# Patient Record
Sex: Female | Born: 1935 | Race: Black or African American | Hispanic: No | Smoking: Former smoker
Health system: Southern US, Community
[De-identification: ages and names within clinical notes are randomized; demographics above are authoritative.]

## PROBLEM LIST (undated history)

## (undated) DIAGNOSIS — F409 Phobic anxiety disorder, unspecified: Secondary | ICD-10-CM

## (undated) DIAGNOSIS — H539 Unspecified visual disturbance: Secondary | ICD-10-CM

## (undated) DIAGNOSIS — H409 Unspecified glaucoma: Secondary | ICD-10-CM

## (undated) DIAGNOSIS — R413 Other amnesia: Secondary | ICD-10-CM

## (undated) DIAGNOSIS — F0390 Unspecified dementia without behavioral disturbance: Secondary | ICD-10-CM

## (undated) DIAGNOSIS — I519 Heart disease, unspecified: Secondary | ICD-10-CM

## (undated) DIAGNOSIS — G3184 Mild cognitive impairment, so stated: Secondary | ICD-10-CM

## (undated) DIAGNOSIS — M1990 Unspecified osteoarthritis, unspecified site: Secondary | ICD-10-CM

## (undated) DIAGNOSIS — F29 Unspecified psychosis not due to a substance or known physiological condition: Secondary | ICD-10-CM

## (undated) DIAGNOSIS — I2510 Atherosclerotic heart disease of native coronary artery without angina pectoris: Secondary | ICD-10-CM

## (undated) DIAGNOSIS — I639 Cerebral infarction, unspecified: Secondary | ICD-10-CM

## (undated) DIAGNOSIS — R443 Hallucinations, unspecified: Secondary | ICD-10-CM

## (undated) DIAGNOSIS — I10 Essential (primary) hypertension: Secondary | ICD-10-CM

## (undated) HISTORY — DX: Cerebral infarction, unspecified: I63.9

## (undated) HISTORY — DX: Unspecified psychosis not due to a substance or known physiological condition: F29

## (undated) HISTORY — PX: CORONARY ANGIOPLASTY WITH STENT PLACEMENT: SHX49

## (undated) HISTORY — DX: Unspecified osteoarthritis, unspecified site: M19.90

## (undated) HISTORY — DX: Phobic anxiety disorder, unspecified: F40.9

## (undated) HISTORY — DX: Unspecified visual disturbance: H53.9

## (undated) HISTORY — DX: Mild cognitive impairment of uncertain or unknown etiology: G31.84

## (undated) HISTORY — DX: Hallucinations, unspecified: R44.3

## (undated) HISTORY — DX: Unspecified glaucoma: H40.9

## (undated) HISTORY — DX: Unspecified dementia, unspecified severity, without behavioral disturbance, psychotic disturbance, mood disturbance, and anxiety: F03.90

## (undated) HISTORY — PX: CORONARY STENT PLACEMENT: SHX1402

## (undated) HISTORY — DX: Heart disease, unspecified: I51.9

---

## 2007-05-24 DIAGNOSIS — Z955 Presence of coronary angioplasty implant and graft: Secondary | ICD-10-CM

## 2007-05-24 HISTORY — DX: Presence of coronary angioplasty implant and graft: Z95.5

## 2007-08-17 ENCOUNTER — Emergency Department (HOSPITAL_COMMUNITY): Admission: EM | Admit: 2007-08-17 | Discharge: 2007-08-17 | Payer: Self-pay | Admitting: Emergency Medicine

## 2007-12-23 ENCOUNTER — Emergency Department (HOSPITAL_COMMUNITY): Admission: EM | Admit: 2007-12-23 | Discharge: 2007-12-23 | Payer: Self-pay | Admitting: Emergency Medicine

## 2008-07-16 DIAGNOSIS — M1990 Unspecified osteoarthritis, unspecified site: Secondary | ICD-10-CM | POA: Insufficient documentation

## 2008-07-16 DIAGNOSIS — R0602 Shortness of breath: Secondary | ICD-10-CM

## 2008-07-16 DIAGNOSIS — R634 Abnormal weight loss: Secondary | ICD-10-CM

## 2008-07-16 DIAGNOSIS — R443 Hallucinations, unspecified: Secondary | ICD-10-CM

## 2008-07-16 DIAGNOSIS — Z8673 Personal history of transient ischemic attack (TIA), and cerebral infarction without residual deficits: Secondary | ICD-10-CM

## 2008-07-16 DIAGNOSIS — E109 Type 1 diabetes mellitus without complications: Secondary | ICD-10-CM | POA: Insufficient documentation

## 2008-07-16 DIAGNOSIS — H409 Unspecified glaucoma: Secondary | ICD-10-CM | POA: Insufficient documentation

## 2008-07-16 DIAGNOSIS — E785 Hyperlipidemia, unspecified: Secondary | ICD-10-CM

## 2008-07-16 DIAGNOSIS — F411 Generalized anxiety disorder: Secondary | ICD-10-CM

## 2008-07-16 DIAGNOSIS — I10 Essential (primary) hypertension: Secondary | ICD-10-CM | POA: Insufficient documentation

## 2008-07-16 DIAGNOSIS — I259 Chronic ischemic heart disease, unspecified: Secondary | ICD-10-CM | POA: Insufficient documentation

## 2008-07-16 DIAGNOSIS — G4700 Insomnia, unspecified: Secondary | ICD-10-CM

## 2008-07-16 DIAGNOSIS — F409 Phobic anxiety disorder, unspecified: Secondary | ICD-10-CM | POA: Insufficient documentation

## 2008-07-16 DIAGNOSIS — J301 Allergic rhinitis due to pollen: Secondary | ICD-10-CM | POA: Insufficient documentation

## 2011-02-14 LAB — POCT I-STAT, CHEM 8
BUN: 14
Calcium, Ion: 1.25
Chloride: 102
Creatinine, Ser: 0.7
Glucose, Bld: 157 — ABNORMAL HIGH
HCT: 33 — ABNORMAL LOW
Hemoglobin: 11.2 — ABNORMAL LOW
Potassium: 4.1
Sodium: 137
TCO2: 29

## 2011-02-14 LAB — DIFFERENTIAL
Basophils Absolute: 0
Basophils Relative: 1
Eosinophils Absolute: 0.1
Monocytes Absolute: 0.4
Neutro Abs: 3.2
Neutrophils Relative %: 55

## 2011-02-14 LAB — POCT CARDIAC MARKERS
CKMB, poc: <1 — ABNORMAL LOW
Myoglobin, poc: 28.5
Operator id: 285841
Troponin i, poc: <0.05

## 2011-02-14 LAB — URINE CULTURE: Colony Count: 50000

## 2011-02-14 LAB — URINALYSIS, ROUTINE W REFLEX MICROSCOPIC
Bilirubin Urine: NEGATIVE
Glucose, UA: NEGATIVE
Hgb urine dipstick: NEGATIVE
Ketones, ur: NEGATIVE
Specific Gravity, Urine: 1.013
pH: 7.5

## 2011-02-14 LAB — CBC
Hemoglobin: 11.4 — ABNORMAL LOW
MCHC: 34.3
MCV: 89.4
RDW: 12.8

## 2011-02-14 LAB — URINE MICROSCOPIC-ADD ON

## 2011-02-18 LAB — DIFFERENTIAL
Eosinophils Absolute: 0.1
Lymphocytes Relative: 41
Lymphs Abs: 2.2
Monocytes Relative: 7
Neutrophils Relative %: 51

## 2011-02-18 LAB — CBC
HCT: 31.9 — ABNORMAL LOW
MCV: 88.9
Platelets: 197
RBC: 3.59 — ABNORMAL LOW
WBC: 5.4

## 2011-02-18 LAB — URINALYSIS, ROUTINE W REFLEX MICROSCOPIC
Bilirubin Urine: NEGATIVE
Glucose, UA: NEGATIVE
Hgb urine dipstick: NEGATIVE
Protein, ur: NEGATIVE
Urobilinogen, UA: 0.2

## 2011-02-18 LAB — URINE CULTURE: Culture: NO GROWTH

## 2011-02-18 LAB — BASIC METABOLIC PANEL
BUN: 10
Chloride: 110
Creatinine, Ser: 0.64
GFR calc Af Amer: >60
GFR calc non Af Amer: >60
Potassium: 3.7

## 2011-08-11 ENCOUNTER — Encounter (HOSPITAL_COMMUNITY): Payer: Self-pay | Admitting: *Deleted

## 2011-08-11 ENCOUNTER — Emergency Department (HOSPITAL_COMMUNITY)
Admission: EM | Admit: 2011-08-11 | Discharge: 2011-08-11 | Disposition: A | Discharge status: Home / Self Care | Payer: Medicare Other | Attending: Emergency Medicine | Admitting: Emergency Medicine

## 2011-08-11 DIAGNOSIS — R259 Unspecified abnormal involuntary movements: Secondary | ICD-10-CM | POA: Insufficient documentation

## 2011-08-11 DIAGNOSIS — E1169 Type 2 diabetes mellitus with other specified complication: Secondary | ICD-10-CM | POA: Insufficient documentation

## 2011-08-11 DIAGNOSIS — R251 Tremor, unspecified: Secondary | ICD-10-CM

## 2011-08-11 DIAGNOSIS — I10 Essential (primary) hypertension: Secondary | ICD-10-CM | POA: Insufficient documentation

## 2011-08-11 DIAGNOSIS — E162 Hypoglycemia, unspecified: Secondary | ICD-10-CM

## 2011-08-11 DIAGNOSIS — F411 Generalized anxiety disorder: Secondary | ICD-10-CM | POA: Insufficient documentation

## 2011-08-11 DIAGNOSIS — R51 Headache: Secondary | ICD-10-CM | POA: Insufficient documentation

## 2011-08-11 HISTORY — DX: Essential (primary) hypertension: I10

## 2011-08-11 LAB — GLUCOSE, CAPILLARY
Glucose-Capillary: 48 mg/dL — ABNORMAL LOW (ref 70–99)
Glucose-Capillary: 353 mg/dL — ABNORMAL HIGH (ref 70–99)
Glucose-Capillary: 180 mg/dL — ABNORMAL HIGH (ref 70–99)

## 2011-08-11 LAB — POCT I-STAT, CHEM 8
BUN: 14 mg/dL (ref 6–23)
Chloride: 104 mEq/L (ref 96–112)
Creatinine, Ser: 0.7 mg/dL (ref 0.50–1.10)
Sodium: 137 mEq/L (ref 135–145)

## 2011-08-11 MED ORDER — DEXTROSE 50 % IV SOLN
1.0000 | Freq: Once | INTRAVENOUS | Status: AC
  Administered 2011-08-11: 50 mL via INTRAVENOUS
  Filled 2011-08-11: qty 50

## 2011-08-11 MED ORDER — ACETAMINOPHEN 325 MG PO TABS
650.0000 mg | ORAL_TABLET | Freq: Once | ORAL | Status: AC
  Administered 2011-08-11: 650 mg via ORAL
  Filled 2011-08-11: qty 2

## 2011-08-11 NOTE — Discharge Instructions (Signed)
It is important for you to follow up with your primary care physician regarding your blood sugar.  It is also important for you to eat something every couple of hours to prevent your blood sugar from dropping.

## 2011-08-11 NOTE — ED Notes (Signed)
Heather PA aware of pt glucose from i-stat results.

## 2011-08-11 NOTE — ED Notes (Addendum)
Per EMS- they were called to house doe shortness of breath, upon arrival pt c/o anxiety and shaking, pt has calmed down at this time, denies shortness of breath, continues to c/o scalp pain and states that it feels like someone is pulling her hair, no distress at this time, CBG 144

## 2011-08-11 NOTE — ED Provider Notes (Signed)
History     CSN: [id number]  Arrival date & time 08/11/11  1639   First MD Initiated Contact with Patient 08/11/11 1707      Chief Complaint  Patient presents with  . Anxiety    (Consider location/radiation/quality/duration/timing/severity/associated sxs/prior treatment) HPI Comments: Patient comes in today with a chief complaint of a headache and tremors.  Headache is frontal and does not radiate.  She reports that she has had a headache intermittently for the past month.  She reports that she has seen her PCP for this complaint.  She reports that she was recently started on Gabapentin for diabetic neuropathy.  She also reports that she has a history of DM and is currently on Novalog 70/30.  She takes this medication in the morning.  She is on 20 Units.  She reports that she checks her blood sugar once daily and her blood sugars typically run between 120 and 220.  She also reports that her blood sugar typically drops when she has not eaten.  The last time she had anything to eat was at noon today.  She reports that at this time she feels that her blood sugar is low.   She also reports that she was feeling upset and anxious earlier today, but does not feel that way at this time.  No SI or HI.  She is not on any regular medication for DM.    The history is provided by the patient.    Past Medical History  Diagnosis Date  . Hypertension   . Diabetes mellitus     No past surgical history on file.  No family history on file.  History  Substance Use Topics  . Smoking status: Not on file  . Smokeless tobacco: Not on file  . Alcohol Use:     OB History    Grav Para Term Preterm Abortions TAB SAB Ect Mult Living                  Review of Systems  Constitutional: Negative for fever and chills.  HENT: Negative for neck pain and neck stiffness.   Eyes: Negative for pain and visual disturbance.  Respiratory: Negative for shortness of breath.   Cardiovascular: Negative for chest  pain.  Gastrointestinal: Negative for nausea and vomiting.  Musculoskeletal: Negative for gait problem.  Skin: Negative for rash.  Neurological: Positive for tremors and headaches. Negative for dizziness, syncope, facial asymmetry, speech difficulty, weakness, light-headedness and numbness.  Psychiatric/Behavioral: Negative for confusion.    Allergies  Review of patient's allergies indicates no known allergies.  Home Medications  No current outpatient prescriptions on file.  BP 164/80  Pulse 78  Temp(Src) 97.9 F (36.6 C) (Oral)  SpO2 99%  Physical Exam  Nursing note and vitals reviewed. Constitutional: She is oriented to person, place, and time. She appears well-developed and well-nourished. No distress.  HENT:  Head: Normocephalic and atraumatic.  Mouth/Throat: Oropharynx is clear and moist.  Eyes: EOM are normal. Pupils are equal, round, and reactive to light.  Neck: Normal range of motion. Neck supple.  Cardiovascular: Normal rate, regular rhythm, normal heart sounds and intact distal pulses.   Pulmonary/Chest: Effort normal and breath sounds normal.  Musculoskeletal: Normal range of motion.  Neurological: She is alert and oriented to person, place, and time. She has normal strength and normal reflexes. She displays tremor. No cranial nerve deficit or sensory deficit. Coordination and gait normal.       Patient with tremor of UE  and LE bilaterally  Skin: Skin is warm and dry. No rash noted. She is not diaphoretic.  Psychiatric: She has a normal mood and affect.    ED Course  Procedures (including critical care time)  Labs Reviewed  GLUCOSE, CAPILLARY - Abnormal; Notable for the following:    Glucose-Capillary 48 (*)    All other components within normal limits   No results found.   No diagnosis found.  Patient's blood sugar had dropped down to 48.  Patient was given 1 amp of D50, ate a sandwich,  and blood sugar was rechecked after 45 minutes.  At that time it  was 353.  Discussed with Dr. Karma Ganja.  Plan is to recheck blood sugar after 30 minutes.    Rechecked blood sugar was 181.   9:09 PM Reassessed patient.  Headache and tremors have improved. Discussed the patient with Dr. Karma Ganja.  MDM  Patient comes in today with headache and tremors.  Her blood sugar was low in the ED.  Patient given sandwich to eat and Amp of D50.  Her headache and tremors then improved.  Patient instructed to follow up with her PCP regarding insulin.  Istat 8 done today was normal.  Normal creatine.   Patient also recently started on Gabapentin.  Looked up the side effects of Gabapentin and a common reaction is listed as tremors.  Therefore, this medication could be contributing to tremors.  Normal neurological exam.  VSS.  Afebrile,  No neck pain or stiffness, numbness, facial asymmetry, or weakness.  Therefore, feel that patient can be discharged home and follow up with PCP.  Patient verbalizes understanding and is in agreement with the plan.        Pascal Lux [place], PA-C 08/12/11 [phone]

## 2011-08-13 NOTE — ED Provider Notes (Signed)
Medical screening examination/treatment/procedure(s) were performed by non-physician practitioner and as supervising physician I was immediately available for consultation/collaboration.  Ethelda Chick, MD 08/13/11 [phone]

## 2011-09-01 ENCOUNTER — Emergency Department (HOSPITAL_COMMUNITY)
Admission: EM | Admit: 2011-09-01 | Discharge: 2011-09-01 | Disposition: A | Discharge status: Home / Self Care | Payer: Medicare Other | Attending: Emergency Medicine | Admitting: Emergency Medicine

## 2011-09-01 ENCOUNTER — Encounter (HOSPITAL_COMMUNITY): Payer: Self-pay | Admitting: *Deleted

## 2011-09-01 ENCOUNTER — Emergency Department (HOSPITAL_COMMUNITY): Payer: Medicare Other

## 2011-09-01 DIAGNOSIS — I10 Essential (primary) hypertension: Secondary | ICD-10-CM | POA: Insufficient documentation

## 2011-09-01 DIAGNOSIS — E119 Type 2 diabetes mellitus without complications: Secondary | ICD-10-CM | POA: Insufficient documentation

## 2011-09-01 DIAGNOSIS — R51 Headache: Secondary | ICD-10-CM

## 2011-09-01 LAB — GLUCOSE, CAPILLARY: Glucose-Capillary: 89 mg/dL (ref 70–99)

## 2011-09-01 MED ORDER — DEXAMETHASONE SODIUM PHOSPHATE 10 MG/ML IJ SOLN
10.0000 mg | Freq: Once | INTRAMUSCULAR | Status: AC
  Administered 2011-09-01: 10 mg via INTRAVENOUS
  Filled 2011-09-01: qty 1

## 2011-09-01 MED ORDER — KETOROLAC TROMETHAMINE 30 MG/ML IJ SOLN
30.0000 mg | Freq: Once | INTRAMUSCULAR | Status: AC
  Administered 2011-09-01: 30 mg via INTRAVENOUS
  Filled 2011-09-01: qty 1

## 2011-09-01 MED ORDER — SODIUM CHLORIDE 0.9 % IV BOLUS (SEPSIS)
1000.0000 mL | Freq: Once | INTRAVENOUS | Status: AC
  Administered 2011-09-01: 1000 mL via INTRAVENOUS

## 2011-09-01 NOTE — Discharge Instructions (Signed)

## 2011-09-01 NOTE — ED Provider Notes (Signed)
Medical screening examination/treatment/procedure(s) were conducted as a shared visit with non-physician practitioner(s) and myself.  I personally evaluated the patient during the encounter This elderly female with psychosis, history of stroke, now presents with headache as different from her typical headache as well as new tremor.  Given the absence of prior CT eval and her new complaints, the patient had CT, which was unremarkable.  Patient received medications with substantial decrease in her complaints.  She is discharged in stable condition to follow up with her primary care physician.  Gerhard Munch, MD 09/01/11 [phone]

## 2011-09-01 NOTE — ED Notes (Signed)
Patient eating sandwich

## 2011-09-01 NOTE — ED Provider Notes (Signed)
History     CSN: [id number]  Arrival date & time 09/01/11  1511   First MD Initiated Contact with Patient 09/01/11 1735      Chief Complaint  Patient presents with  . Headache    (Consider location/radiation/quality/duration/timing/severity/associated sxs/prior treatment) HPI  77 year old female with history of hypertension and history of diabetes presents with chief complaints of headache.  Pt sts she has a history of headache. She has been having headaches for the past several years.  Described as a dull sensation affecting the left side of her head.  Nothing makes it worse or better.  Denies any vision changes, earaches, or hearing changes.  Denies cp, sob, abd pain, n/v/d, or rash.  Denies numbness or weakness.  Pt sts "i think i have a stroke" and that's what prompt her to come today.  Pt also sts she has auditory hallucination.  Sts she hear voices for many years, and the voices are telling her many different things but sometimes they do want to hurt her.  She denies SI/HI.  Denies feeling unsafe at home.  She denies any recent medication changes.     Past Medical History  Diagnosis Date  . Hypertension   . Diabetes mellitus     History reviewed. No pertinent past surgical history.  No family history on file.  History  Substance Use Topics  . Smoking status: Never Smoker   . Smokeless tobacco: Not on file  . Alcohol Use: No    OB History    Grav Para Term Preterm Abortions TAB SAB Ect Mult Living                  Review of Systems  All other systems reviewed and are negative.    Allergies  Review of patient's allergies indicates no known allergies.  Home Medications  No current outpatient prescriptions on file.  BP 127/64  Pulse 52  Temp 98.6 F (37 C)  Resp 18  Ht 5' (1.524 m)  Wt 120 lb (54.432 kg)  BMI 23.44 kg/m2  SpO2 97%  Physical Exam  Nursing note and vitals reviewed. Constitutional: She is oriented to person, place, and time. She  appears well-developed and well-nourished. No distress.       Awake, alert, nontoxic appearance  HENT:  Head: Normocephalic and atraumatic.  Right Ear: External ear normal.  Left Ear: External ear normal.  Mouth/Throat: Oropharynx is clear and moist. No oropharyngeal exudate.  Eyes: Conjunctivae and EOM are normal. Pupils are equal, round, and reactive to light. Right eye exhibits no discharge. Left eye exhibits no discharge.  Neck: Normal range of motion. Neck supple. No Brudzinski's sign noted.  Cardiovascular: Normal rate and regular rhythm.  Exam reveals no gallop and no friction rub.   No murmur heard. Pulmonary/Chest: Effort normal. No respiratory distress. She exhibits no tenderness.  Abdominal: Soft. There is no tenderness. There is no rebound.  Musculoskeletal: She exhibits no tenderness.       ROM appears intact, no obvious focal weakness.  Resting tremor noted to L hand > R hand  Lymphadenopathy:    She has no cervical adenopathy.  Neurological: She is alert and oriented to person, place, and time. She displays normal reflexes. No cranial nerve deficit. She exhibits normal muscle tone. She displays a negative Romberg sign. Coordination and gait normal. GCS eye subscore is 4. GCS verbal subscore is 5. GCS motor subscore is 6.       Mental status and motor strength appears  intact  Skin: Skin is warm. No rash noted.  Psychiatric: She has a normal mood and affect.    ED Course  Procedures (including critical care time)  Labs Reviewed - No data to display No results found.   No diagnosis found.    MDM  Headache, chronic.  No meningismal sign.  Low suspicion for [facility].  CBG 81. NO focal neuro deficits.  L hand tremor, chronic.    Pt was seen in ED a few weeks ago for similar complaint, and was found to have a CBG of 48.  Was treated for hypoglycemia and her sxs improves.  She was then discharged.    Pt has mentioned of hearing voices only when i ask her about it.  She denies  depression or HI/SI.  Hearing voices is chronic.  I have request my attending to evaluate pt.  My plan is to treat her headache with a migraine cocktail as pt sts she has trouble sleeping recently. Her VS is stable and she is afebrile.   6:36 PM Head Ct is unremarkable.  Pt is not on anticoagulant.  My attending recommend toradol/decadron and NS IVF as headache treatment.     8:03 PM Headache has improved and able to tolerates PO.  She ate a meal.  She is ready to be discharge.  She will f/u with her PCP Dr. Ethelene Hal.  She has no red flags sign.    Fayrene Helper, PA-C 09/01/11 2004

## 2011-09-01 NOTE — ED Notes (Signed)
Pt states she has had headache for a couple of weeks. Pt denies any loc or blurred vision.pt also denies any n/v pt states it is just a constant ache

## 2013-07-09 ENCOUNTER — Ambulatory Visit: Payer: Medicare Other | Admitting: Internal Medicine

## 2013-07-12 ENCOUNTER — Telehealth: Payer: Self-pay | Admitting: *Deleted

## 2013-09-23 NOTE — Telephone Encounter (Signed)
Encounter Closed---09/23/13 TP 

## 2013-12-16 ENCOUNTER — Other Ambulatory Visit: Payer: Self-pay | Admitting: *Deleted

## 2013-12-16 DIAGNOSIS — M810 Age-related osteoporosis without current pathological fracture: Secondary | ICD-10-CM

## 2013-12-24 ENCOUNTER — Encounter (HOSPITAL_COMMUNITY): Payer: Self-pay | Admitting: Emergency Medicine

## 2013-12-24 ENCOUNTER — Emergency Department (HOSPITAL_COMMUNITY)
Admission: EM | Admit: 2013-12-24 | Discharge: 2013-12-24 | Disposition: A | Discharge status: Home / Self Care | Payer: Medicare HMO | Attending: Emergency Medicine | Admitting: Emergency Medicine

## 2013-12-24 DIAGNOSIS — R1013 Epigastric pain: Secondary | ICD-10-CM | POA: Insufficient documentation

## 2013-12-24 DIAGNOSIS — R112 Nausea with vomiting, unspecified: Secondary | ICD-10-CM | POA: Diagnosis not present

## 2013-12-24 DIAGNOSIS — I2510 Atherosclerotic heart disease of native coronary artery without angina pectoris: Secondary | ICD-10-CM | POA: Insufficient documentation

## 2013-12-24 DIAGNOSIS — Z794 Long term (current) use of insulin: Secondary | ICD-10-CM | POA: Diagnosis not present

## 2013-12-24 DIAGNOSIS — Z9861 Coronary angioplasty status: Secondary | ICD-10-CM | POA: Diagnosis not present

## 2013-12-24 DIAGNOSIS — Z79899 Other long term (current) drug therapy: Secondary | ICD-10-CM | POA: Insufficient documentation

## 2013-12-24 DIAGNOSIS — I10 Essential (primary) hypertension: Secondary | ICD-10-CM | POA: Insufficient documentation

## 2013-12-24 DIAGNOSIS — E119 Type 2 diabetes mellitus without complications: Secondary | ICD-10-CM | POA: Diagnosis not present

## 2013-12-24 DIAGNOSIS — E860 Dehydration: Secondary | ICD-10-CM | POA: Insufficient documentation

## 2013-12-24 DIAGNOSIS — T50905A Adverse effect of unspecified drugs, medicaments and biological substances, initial encounter: Secondary | ICD-10-CM

## 2013-12-24 HISTORY — DX: Other amnesia: R41.3

## 2013-12-24 HISTORY — DX: Atherosclerotic heart disease of native coronary artery without angina pectoris: I25.10

## 2013-12-24 LAB — COMPREHENSIVE METABOLIC PANEL
ALT: 16 U/L (ref 0–35)
AST: 27 U/L (ref 0–37)
Albumin: 4 g/dL (ref 3.5–5.2)
Alkaline Phosphatase: 78 U/L (ref 39–117)
Anion gap: 14 (ref 5–15)
BUN: 20 mg/dL (ref 6–23)
CALCIUM: 9.5 mg/dL (ref 8.4–10.5)
CO2: 26 meq/L (ref 19–32)
Chloride: 97 mEq/L (ref 96–112)
Creatinine, Ser: 0.89 mg/dL (ref 0.50–1.10)
GFR calc Af Amer: 71 mL/min — ABNORMAL LOW (ref >90)
GFR, EST NON AFRICAN AMERICAN: 61 mL/min — AB (ref >90)
Glucose, Bld: 219 mg/dL — ABNORMAL HIGH (ref 70–99)
Potassium: 3.7 mEq/L (ref 3.7–5.3)
SODIUM: 137 meq/L (ref 137–147)
TOTAL PROTEIN: 7.7 g/dL (ref 6.0–8.3)
Total Bilirubin: 1 mg/dL (ref 0.3–1.2)

## 2013-12-24 LAB — CBC
HCT: 40.5 % (ref 36.0–46.0)
HEMOGLOBIN: 13.4 g/dL (ref 12.0–15.0)
MCH: 29.5 pg (ref 26.0–34.0)
MCHC: 33.1 g/dL (ref 30.0–36.0)
MCV: 89.2 fL (ref 78.0–100.0)
Platelets: 220 10*3/uL (ref 150–400)
RBC: 4.54 MIL/uL (ref 3.87–5.11)
RDW: 12.9 % (ref 11.5–15.5)
WBC: 7 10*3/uL (ref 4.0–10.5)

## 2013-12-24 LAB — URINE MICROSCOPIC-ADD ON

## 2013-12-24 LAB — TROPONIN I: Troponin I: <0.3 ng/mL (ref <0.30)

## 2013-12-24 LAB — URINALYSIS, ROUTINE W REFLEX MICROSCOPIC
Glucose, UA: 100 mg/dL — AB
Ketones, ur: 15 mg/dL — AB
Nitrite: NEGATIVE
PH: 5.5 (ref 5.0–8.0)
Protein, ur: NEGATIVE mg/dL
SPECIFIC GRAVITY, URINE: 1.023 (ref 1.005–1.030)
Urobilinogen, UA: 1 mg/dL (ref 0.0–1.0)

## 2013-12-24 MED ORDER — SODIUM CHLORIDE 0.9 % IV BOLUS (SEPSIS)
1000.0000 mL | Freq: Once | INTRAVENOUS | Status: AC
  Administered 2013-12-24: 1000 mL via INTRAVENOUS

## 2013-12-24 MED ORDER — ONDANSETRON 4 MG PO TBDP: 4.0000 mg | ORAL_TABLET | Freq: Three times a day (TID) | ORAL | Status: DC | PRN

## 2013-12-24 MED ORDER — ONDANSETRON HCL 4 MG/2ML IJ SOLN: 4.0000 mg | Freq: Once | INTRAMUSCULAR | Status: DC

## 2013-12-24 NOTE — Discharge Instructions (Signed)
You were evaluated for nausea and vomiting, which seems to have improved. We suspect that this was likely a medication side-effect from the new Aricept and Lipitor that you started taking several days ago. Our lab tests did not show any abnormalities. Please stop taking Aricept and Lipitor - do not resume these until you discuss these medications with your doctor. We recommend that these are discontinued. You may resume your other regular medications including your Blood Pressure medications and Insulin. Eat a bland to clear liquid diet as tolerated for a day, and then increase to regular diet as tolerated. Given prescription for Zofran ODT - anti-nausea medicine. Please use this as needed if your nausea / vomiting returns.  If your symptoms return with nausea and vomiting that do not resolve with Zofran, please call your regular doctor or return to the Emergency Department for further evaluation.  We recommend that you see your regular doctor within 1-2 weeks for regular follow-up and to discuss the medication side-effects.

## 2013-12-24 NOTE — ED Notes (Signed)
To ED from home with c/o nausea and vomiting for 3 days-- started after taking Lipitor and Aricept 3 days ago. States is unable to keep anything down. Denies diarrhea

## 2013-12-24 NOTE — ED Provider Notes (Signed)
CSN: [id number]     Arrival date & time 12/24/13  1038 History   First MD Initiated Contact with Patient 12/24/13 1040   History provided by patient.   Chief Complaint  Patient presents with  . Nausea  . Emesis   HPI  Patient reported symptoms started about 3 days ago with persistent nausea and vomiting. She states that vomiting back shortly after taking new medications, Aricept and Lipitor. Admits to previously taking Lipitor > 1 year ago without problems, never been on Aricept in the past. Described vomiting as mostly phlegm, some food, and then some "brown colored vomit", denied any bilious, red streaks / bloody vomit or any dark coffee ground colored emesis. Multiple episodes of vomiting and dry heaving associated with epigastric pain, worse with vomiting. Last vomiting episode was last night, unable to tolerate PO over past 2-3 days, last attempted [place] sandwich and small soda last night.  Admits to recent URI symptoms x 1 month, denies fevers/chills, diarrhea, sick contacts.  Today on presentation, patient did not take any meds (no Aricept, Lipitor and no anti-HTN meds), currently reports symptoms are resolved, denies any nausea, vomiting, or abdominal pain today. Also admits that 2 days ago after vomiting started, she did not take the new meds Aricept / Lipitor and did not vomit that day, but vomiting resumed the following day after resuming these.  Significant hx with IDDM, HTN, known CAD. Compliant with insulin therapy.  Past Medical History  Diagnosis Date  . Hypertension   . Diabetes mellitus   . Coronary artery disease   . Memory loss     just started on aricept   Past Surgical History  Procedure Laterality Date  . Coronary stent placement     No family history on file. History  Substance Use Topics  . Smoking status: Never Smoker   . Smokeless tobacco: Not on file  . Alcohol Use: No   OB History   Grav Para Term Preterm Abortions TAB SAB Ect Mult Living                  Review of Systems  See above HPI  Allergies  Review of patient's allergies indicates no known allergies.  Home Medications   Prior to Admission medications   Medication Sig Start Date End Date Taking? Authorizing Provider  atorvastatin (LIPITOR) 20 MG tablet Take 20 mg by mouth daily. 12/19/13  Yes Historical Provider, MD  donepezil (ARICEPT) 10 MG tablet Take 10 mg by mouth daily. 12/19/13  Yes Historical Provider, MD  insulin aspart protamine-insulin aspart (NOVOLOG 70/30) (70-30) 100 UNIT/ML injection Inject 35 Units into the skin daily.    Yes Historical Provider, MD  valsartan (DIOVAN) 80 MG tablet Take 80 mg by mouth daily.   Yes Historical Provider, MD  ondansetron (ZOFRAN ODT) 4 MG disintegrating tablet Take 1 tablet (4 mg total) by mouth every 8 (eight) hours as needed for nausea or vomiting. 12/24/13   Alexander Althea Charon, DO   BP 167/61  Pulse 56  Temp(Src) 97.9 F (36.6 C) (Oral)  Resp 13  SpO2 98% Physical Exam  Gen - elderly female, well-appearing, cooperative, NAD HEENT - NCAT, PERRL, EOMI, oropharynx clear, dentures in place, mild dry MM Neck - supple, non-tender Heart - RRR, no murmurs heard Lungs - CTAB. Normal work of breathing. Abd - soft, NTND, no masses, +hyperactive BS Ext - non-tender, no edema, peripheral pulses intact +2 b/l Skin - warm, dry, no rashes Neuro - awake, alert, oriented (person,  place, year), grossly non-focal, intact muscle strength 5/5 b/l  ED Course  Procedures (including critical care time) Labs Review Labs Reviewed  COMPREHENSIVE METABOLIC PANEL - Abnormal; Notable for the following:    Glucose, Bld 219 (*)    GFR calc non Af Amer 61 (*)    GFR calc Af Amer 71 (*)    All other components within normal limits  URINALYSIS, ROUTINE W REFLEX MICROSCOPIC - Abnormal; Notable for the following:    Glucose, UA 100 (*)    Hgb urine dipstick MODERATE (*)    Bilirubin Urine SMALL (*)    Ketones, ur 15 (*)    Leukocytes, UA SMALL  (*)    All other components within normal limits  CBC  TROPONIN I  URINE MICROSCOPIC-ADD ON    Imaging Review No results found.   EKG Interpretation   Date/Time:  Tuesday December 24 2013 11:43:33 EDT Ventricular Rate:  67 PR Interval:  161 QRS Duration: 82 QT Interval:  436 QTC Calculation: 460 R Axis:   -21 Text Interpretation:  Sinus rhythm Left ventricular hypertrophy Artifact  in lead(s) I III aVL No significant change since last tracing Confirmed by  Denton LankSTEINL  MD, Caryn BeeKEVIN ([id number]) on 12/24/2013 11:51:41 AM      MDM   Final diagnoses:  Non-intractable vomiting with nausea, vomiting of unspecified type   [age] yr F with PMH IDDM (on 70/30 insulin), HTN, known CAD, memory loss, presents for evaluation of nausea / vomiting x 3 days (non-bilious, non-bloody, with some reported brown color emesis) and associated epigastric pain only with vomiting, previously unable to tolerate PO. Currently patient is well-appearing and only mildly dehydrated, afebrile, no tachycardia, elevated BP (did not take meds today), symptoms resolved on arrival to ED, without any nausea, vomiting, or abd pain. Suspected n/v as medication side-effect from likely newly started Aricept, possible Lipitor, timeline is appropriate, and has not had any vomiting since discontinued meds.  Proceed with IVF rehydration, 1 L NS bolus and Zofran IV x 1 dose, lab work-up with CMET / CBC. Monitor in ED, ambulation, and if remains improved goal of PO fluid trial.   UPDATE @ 1441 - Patient overall feels improved. Denies any nausea, vomiting, abdominal pain. BP improved to 167/61. Tolerated PO challenge with crackers and water without any n/v, tolerated ambulation in hallway. Reviewed results with CMET unremarkable except glucose 219, CBC stable Hgb 13.4, UA negative, Troponin-I negative. No further work-up at this time. Presumed nausea / vomiting secondary to medication side-effect with Aricept, and possibly Lipitor.  Discharge to  home with reassurance, family to pick-up patient and bring home. Advised to stop taking Aricept and Lipitor at this time. Recommend close follow-up with PCP to discuss medication side-effects and re-evaluation. Return precautions given.   Saralyn PilarAlexander Karamalegos, DO 12/24/13 1454

## 2013-12-25 NOTE — ED Provider Notes (Signed)
I saw and evaluated the patient, reviewed the resident's note and I agree with the findings and plan.   EKG Interpretation   Date/Time:  Tuesday December 24 2013 11:43:33 EDT Ventricular Rate:  67 PR Interval:  161 QRS Duration: 82 QT Interval:  436 QTC Calculation: 460 R Axis:   -21 Text Interpretation:  Sinus rhythm Left ventricular hypertrophy Artifact  in lead(s) I III aVL No significant change since last tracing Confirmed by  Margaretta Chittum  MD, Caryn BeeKEVIN ([id number]) on 12/24/2013 11:51:41 AM      Pt c/o nv in the past couple days. No abd pain or distension. Having normal bms. abd soft nt. Ivf. Labs.     Suzi RootsKevin E Emerson Schreifels, MD 12/25/13 [phone]

## 2013-12-30 ENCOUNTER — Other Ambulatory Visit: Payer: Medicare Other

## 2014-10-08 ENCOUNTER — Encounter (HOSPITAL_COMMUNITY): Payer: Self-pay | Admitting: Emergency Medicine

## 2014-10-08 ENCOUNTER — Emergency Department (HOSPITAL_COMMUNITY)
Admission: EM | Admit: 2014-10-08 | Discharge: 2014-10-09 | Disposition: A | Discharge status: Other Acute Inpatient Hospital | Payer: Medicare HMO | Attending: Emergency Medicine | Admitting: Emergency Medicine

## 2014-10-08 DIAGNOSIS — Z9861 Coronary angioplasty status: Secondary | ICD-10-CM | POA: Diagnosis not present

## 2014-10-08 DIAGNOSIS — R443 Hallucinations, unspecified: Secondary | ICD-10-CM | POA: Insufficient documentation

## 2014-10-08 DIAGNOSIS — Z794 Long term (current) use of insulin: Secondary | ICD-10-CM | POA: Diagnosis not present

## 2014-10-08 DIAGNOSIS — E119 Type 2 diabetes mellitus without complications: Secondary | ICD-10-CM | POA: Insufficient documentation

## 2014-10-08 DIAGNOSIS — Z87891 Personal history of nicotine dependence: Secondary | ICD-10-CM | POA: Insufficient documentation

## 2014-10-08 DIAGNOSIS — Z79899 Other long term (current) drug therapy: Secondary | ICD-10-CM | POA: Insufficient documentation

## 2014-10-08 DIAGNOSIS — I10 Essential (primary) hypertension: Secondary | ICD-10-CM | POA: Diagnosis not present

## 2014-10-08 DIAGNOSIS — I2510 Atherosclerotic heart disease of native coronary artery without angina pectoris: Secondary | ICD-10-CM | POA: Insufficient documentation

## 2014-10-08 DIAGNOSIS — Z046 Encounter for general psychiatric examination, requested by authority: Secondary | ICD-10-CM | POA: Diagnosis present

## 2014-10-08 LAB — CBG MONITORING, ED: Glucose-Capillary: 80 mg/dL (ref 65–99)

## 2014-10-08 LAB — COMPREHENSIVE METABOLIC PANEL
ALT: 14 U/L (ref 14–54)
AST: 23 U/L (ref 15–41)
Albumin: 3.9 g/dL (ref 3.5–5.0)
Alkaline Phosphatase: 63 U/L (ref 38–126)
Anion gap: 10 (ref 5–15)
BUN: 17 mg/dL (ref 6–20)
CALCIUM: 9 mg/dL (ref 8.9–10.3)
CO2: 22 mmol/L (ref 22–32)
CREATININE: 0.85 mg/dL (ref 0.44–1.00)
Chloride: 106 mmol/L (ref 101–111)
GLUCOSE: 164 mg/dL — AB (ref 65–99)
Potassium: 3.9 mmol/L (ref 3.5–5.1)
Sodium: 138 mmol/L (ref 135–145)
Total Bilirubin: 0.5 mg/dL (ref 0.3–1.2)
Total Protein: 7.7 g/dL (ref 6.5–8.1)

## 2014-10-08 LAB — CBC
HEMATOCRIT: 36.8 % (ref 36.0–46.0)
HEMOGLOBIN: 12.1 g/dL (ref 12.0–15.0)
MCH: 29.2 pg (ref 26.0–34.0)
MCHC: 32.9 g/dL (ref 30.0–36.0)
MCV: 88.9 fL (ref 78.0–100.0)
Platelets: 246 10*3/uL (ref 150–400)
RBC: 4.14 MIL/uL (ref 3.87–5.11)
RDW: 13 % (ref 11.5–15.5)
WBC: 7.1 10*3/uL (ref 4.0–10.5)

## 2014-10-08 LAB — RAPID URINE DRUG SCREEN, HOSP PERFORMED
Amphetamines: NOT DETECTED
Barbiturates: NOT DETECTED
Benzodiazepines: NOT DETECTED
Cocaine: NOT DETECTED
Opiates: NOT DETECTED
Tetrahydrocannabinol: NOT DETECTED

## 2014-10-08 LAB — ETHANOL: Alcohol, Ethyl (B): <5 mg/dL (ref <5)

## 2014-10-08 LAB — ACETAMINOPHEN LEVEL

## 2014-10-08 LAB — SALICYLATE LEVEL

## 2014-10-08 MED ORDER — IRBESARTAN 75 MG PO TABS
75.0000 mg | ORAL_TABLET | Freq: Every day | ORAL | Status: DC
  Filled 2014-10-08: qty 1

## 2014-10-08 MED ORDER — IRBESARTAN 75 MG PO TABS
75.0000 mg | ORAL_TABLET | Freq: Every day | ORAL | Status: DC
  Administered 2014-10-09 (×2): 75 mg via ORAL
  Filled 2014-10-08 (×4): qty 1

## 2014-10-08 MED ORDER — ONDANSETRON 4 MG PO TBDP: 4.0000 mg | ORAL_TABLET | Freq: Three times a day (TID) | ORAL | Status: DC | PRN

## 2014-10-08 MED ORDER — ATORVASTATIN CALCIUM 20 MG PO TABS
20.0000 mg | ORAL_TABLET | Freq: Every day | ORAL | Status: DC
  Filled 2014-10-08: qty 1

## 2014-10-08 MED ORDER — DONEPEZIL HCL 10 MG PO TABS
10.0000 mg | ORAL_TABLET | Freq: Every day | ORAL | Status: DC
  Filled 2014-10-08: qty 1

## 2014-10-08 MED ORDER — INSULIN ASPART PROT & ASPART (70-30 MIX) 100 UNIT/ML ~~LOC~~ SUSP
35.0000 [IU] | Freq: Every day | SUBCUTANEOUS | Status: DC
  Filled 2014-10-08: qty 10
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
## 2014-10-08 NOTE — ED Notes (Signed)
Bed: WA26 Expected date:  Expected time:  Means of arrival:  Comments: 
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
## 2014-10-09 ENCOUNTER — Other Ambulatory Visit: Payer: Self-pay

## 2014-10-09 LAB — CBG MONITORING, ED: Glucose-Capillary: 83 mg/dL (ref 65–99)

## 2014-10-09 NOTE — [facility]HH Counselor (Signed)
Per [name], PA, pt meets inpt criteria. TTS to seek gero-psych placement.

## 2014-10-09 NOTE — ED Notes (Addendum)
[facility]. Pt needs IVC for [place] and [place] [facility]. EDP aware. Rhonda Request return call at [phone]. Accepting physican Dr Melinda CrutchGacengeci.

## 2014-10-09 NOTE — Progress Notes (Signed)
Pt confirms with CM [facility] [address], [place], [place] [address] [phone]

## 2014-10-09 NOTE — BH Assessment (Addendum)
Inpt gero psych recommended. TTS seeking placement. [facility]  Yoseph Haile, [place] Triage Specialist 10/09/2014 2:18 AM
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
## 2014-10-09 NOTE — ED Notes (Signed)
CBG 80,  70/30 Novolog held Dr Ress notified

## 2014-10-09 NOTE — ED Notes (Addendum)
Patient ate 50% of breakfast.   Robyn LofflerKaziah [name], NT

## 2014-10-09 NOTE — ED Notes (Signed)
IVC papers served, [place] [facility] office called for transport.

## 2014-11-20 ENCOUNTER — Ambulatory Visit: Payer: Medicare Other | Admitting: Neurology

## 2014-12-02 ENCOUNTER — Encounter: Payer: Self-pay | Admitting: Neurology

## 2014-12-02 ENCOUNTER — Ambulatory Visit (INDEPENDENT_AMBULATORY_CARE_PROVIDER_SITE_OTHER): Payer: Medicare HMO | Admitting: Neurology

## 2014-12-02 VITALS — BP 128/66 | HR 64 | Resp 16 | Ht 61.0 in | Wt 135.4 lb

## 2014-12-02 DIAGNOSIS — G4700 Insomnia, unspecified: Secondary | ICD-10-CM | POA: Diagnosis not present

## 2014-12-02 DIAGNOSIS — G3184 Mild cognitive impairment, so stated: Secondary | ICD-10-CM

## 2014-12-02 DIAGNOSIS — F29 Unspecified psychosis not due to a substance or known physiological condition: Secondary | ICD-10-CM

## 2014-12-02 DIAGNOSIS — F411 Generalized anxiety disorder: Secondary | ICD-10-CM

## 2014-12-02 MED ORDER — HYDROXYZINE HCL 10 MG PO TABS
10.0000 mg | ORAL_TABLET | Freq: Three times a day (TID) | ORAL | Status: DC
Start: 2014-12-02 — End: 2015-12-20

## 2014-12-02 NOTE — Progress Notes (Signed)
[facility]  PATIENT: Robyn [name] DOB: Aug 27, 1934  REFERRING DOCTOR OR PCP:  Robyn [name] (Fax: [phone] SOURCE: Records from primary care, patient and her daughter Robyn [name]  _________________________________   HISTORICAL  CHIEF COMPLAINT:  Chief Complaint  Patient presents with  . Memory Loss    Robyn [name] is here with her dtr. Robyn [name] for eval of memory loss, onset yrs. ago.  Robyn [name] sts. she "forgets things I knew the day before."  Sts. she also hears voices that "are picking on me."  For example, if she walks in her bedroom, she might hear a voice that says "Did we tell you you could go in there?", She recently had an incident where she was standing in a chair in her house--sts. she was standing in the chair "because they made me mad" Sts. when she says "they" she means the voices she hears.  She   . Auditory Hallucinations    fell out of the chair, onto her knees.  She now has a Engineer, civil (consulting) and physical therapist that come to her home to work with her. [facility])./fim    HISTORY OF PRESENT ILLNESS:  I had the pleasure seeing you patient, Robyn [name], along with her daughter Robyn [name], at [facility] for neurologic consultation regarding her cognitive decline and hallucinations.  She began to have difficulty with memory and with hallucinations about 1 and 1/2 - 2 years ago. Symptoms have progressed and she has more difficulty with the hallucinations than she used to. She often forgets things that happened the day before.  She is not having any difficulty identifying people.  She does not have any difficulty with conversation.  She continues to handle her finances.  She is not having difficulty leaving appliances on.  The hallucinations are primarily auditory but they will sometimes be visual. She feels the voices are annoying and they often make her mad. As an example if she goes into her bedroom she might hear a voice a "did we tell you could go in  there".   Once, she climbed on chair because of voices were annoying her and then she slipped and hurt her knee. She has noted images on the walls at times and they seem to be linked to the voices.  She does report difficulty with sleep, falling asleep and staying asleep. Sometimes, the voices will be speaking to her when she tries to fall asleep. She feels her voice is sometimes puts him move the like image in her head at night. She does not appear to have a REM behavior disorder.  She notes that her gait is not as good as it used to be. Her right leg does not do as well as the left. He stumbles but does not fall. Her stride is not drastically reduced.  She denies any difficulty with her bladder.  She denies depression though she does note mild anxiety, especially at night when she has insomnia.  Last year she was tried on Aricept but could not tolerate it. She took only 1 pill and had quite a bit of nausea. She was prescribed Namenda but does not appear to be taking it at this point. She is on Seroquel 25 mg at bedtime. She thinks it may help her sleep a little bit and help reduce some of the voices at night, but not all of them.   Montreal Cognitive Assessment  12/02/2014  Visuospatial/ Executive (0/5) 3  Naming (0/3) 3  Attention: Read list of digits (0/2)  2  Attention: Read list of letters (0/1) 1  Attention: Serial 7 subtraction starting at 100 (0/3) 3  Language: Repeat phrase (0/2) 2  Language : Fluency (0/1) 0  Abstraction (0/2) 2  Delayed Recall (0/5) 3  Orientation (0/6) 6  Total 25  Adjusted Score (based on education) 25  ** Note score should be 26/30 as < 12 years education  Recent laboratory tests show low vitamin D (14.0) and elevated hemoglobin A1c (7.9). TSH was minimally elevated at 4.59 in December 2015.  I personally reviewed the CT scan of the head from 09/01/2011. It shows atrophy and chronic ischemic white matter changes. There were no acute findings. The extent of  atrophy was moderately advanced. REVIEW OF SYSTEMS: Constitutional: No fevers, chills, sweats, or change in appetite.   Has insomnia Eyes: No visual changes, double vision, eye pain Ear, nose and throat: No hearing loss, ear pain, nasal congestion, sore throat Cardiovascular: No chest pain, palpitations Respiratory: No shortness of breath at rest or with exertion.   No wheezes GastrointestinaI: No nausea, vomiting, diarrhea, abdominal pain, fecal incontinence Genitourinary: No dysuria, urinary retention or frequency.  No nocturia. Musculoskeletal: No neck pain, back pain Integumentary: No rash, pruritus, skin lesions Neurological: as above Psychiatric: as above Endocrine: No palpitations, diaphoresis, change in appetite, change in weigh or increased thirst Hematologic/Lymphatic: No anemia, purpura, petechiae. Allergic/Immunologic: No itchy/runny eyes, nasal congestion, recent allergic reactions, rashes  ALLERGIES: No Known Allergies  HOME MEDICATIONS:  Current outpatient prescriptions:  .  atorvastatin (LIPITOR) 20 MG tablet, , Disp: , Rfl:  .  hydrochlorothiazide (HYDRODIURIL) 25 MG tablet, , Disp: , Rfl:  .  insulin aspart protamine-insulin aspart (NOVOLOG 70/30) (70-30) 100 UNIT/ML injection, Inject 25-35 Units into the skin daily with breakfast. Has in the morning according to blood sugars., Disp: , Rfl:  .  losartan (COZAAR) 100 MG tablet, , Disp: , Rfl:  .  NOVOLIN 70/30 RELION (70-30) 100 UNIT/ML injection, Inject 25-35 Units into the skin daily with breakfast., Disp: , Rfl:  .  QUEtiapine (SEROQUEL) 25 MG tablet, , Disp: , Rfl:   PAST MEDICAL HISTORY: Past Medical History  Diagnosis Date  . Hypertension   . Diabetes mellitus   . Coronary artery disease   . Memory loss     just started on aricept  . Stroke   . Vision abnormalities     PAST SURGICAL HISTORY: Past Surgical History  Procedure Laterality Date  . Coronary stent placement    . Coronary angioplasty  with stent placement      FAMILY HISTORY: Family History  Problem Relation Age of Onset  . Hypertension Mother   . Diabetes Mother   . Hypertension Father   . Diabetes Father     SOCIAL HISTORY:  History   Social History  . Marital Status: Widowed    Spouse Name: N/A  . Number of Children: N/A  . Years of Education: N/A   Occupational History  . Not on file.   Social History Main Topics  . Smoking status: Former Smoker    Quit date: 10/08/1963  . Smokeless tobacco: Never Used  . Alcohol Use: No  . Drug Use: No  . Sexual Activity: Not on file   Other Topics Concern  . Not on file   Social History Narrative     PHYSICAL EXAM  Filed Vitals:   12/02/14 0953  BP: 128/66  Pulse: 64  Resp: 16  Height:  (1.549 m)  Weight: 135 lb  6.4 oz (61.417 kg)    Body mass index is 25.6 kg/(m^2).   General: The patient is well-developed and well-nourished and in no acute distress  Eyes:  Funduscopic exam shows normal optic discs and retinal vessels.  Neck: The neck is supple, no carotid bruits are noted.  The neck is nontender.  Cardiovascular: The heart has a regular rate and rhythm with a normal S1 and S2. There were no murmurs, gallops or rubs.   Skin: Extremities are without significant edema.   Neurologic Exam  Mental status: The patient is alert and oriented x 3 at the time of the examination. The patient has ruced recent and remote memory, and good attention span and concentration ability.   Speech is normal.  Cranial nerves: Extraocular movements are full. Pupils are equal, round, and reactive to light and accomodation.  Visual fields are full.  Facial symmetry is present. There is good facial sensation to soft touch bilaterally.Facial strength is normal.  Trapezius and sternocleidomastoid strength is normal. No dysarthria is noted.  The tongue is midline, and the patient has symmetric elevation of the soft palate. No obvious hearing deficits are  noted.  Motor:  Muscle bulk is normal.   Tone is normal. Strength is  5 / 5 in all 4 extremities.   Sensory: Sensory testing is intact to temperature, touch and vibration sensation in all 4 extremities.  Coordination: Cerebellar testing reveals good finger-nose-finger and heel-to-shin bilaterally.  Gait and station: Station is normal.   Gait is normal for age. Tandem gait is normal for age. No retropulsion.  Romberg is negative.   Reflexes: Deep tendon reflexes are symmetric and normal bilaterally.   Plantar responses are flexor.    DIAGNOSTIC DATA (LABS, IMAGING, TESTING) - I reviewed patient records, labs, notes, testing and imaging myself where available.  Lab Results  Component Value Date   WBC 7.1 10/08/2014   HGB 12.1 10/08/2014   HCT 36.8 10/08/2014   MCV 88.9 10/08/2014   PLT 246 10/08/2014      Component Value Date/Time   NA 138 10/08/2014 1914   K 3.9 10/08/2014 1914   CL 106 10/08/2014 1914   CO2 22 10/08/2014 1914   GLUCOSE 164* 10/08/2014 1914   BUN 17 10/08/2014 1914   CREATININE 0.85 10/08/2014 1914   CALCIUM 9.0 10/08/2014 1914   PROT 7.7 10/08/2014 1914   ALBUMIN 3.9 10/08/2014 1914   AST 23 10/08/2014 1914   ALT 14 10/08/2014 1914   ALKPHOS 63 10/08/2014 1914   BILITOT 0.5 10/08/2014 1914   GFRNONAA >60 10/08/2014 1914   GFRAA >60 10/08/2014 1914       ASSESSMENT AND PLAN  Psychosis, unspecified psychosis type  Mild cognitive impairment  Insomnia  Anxiety state  In summary, Talayah Picardi is a 80 year old woman with a two-year history of worsening psychosis if setting of mild cognitive impairment and abnormal CT scan of the head showing atrophy. She actually performed quite a bit better on the [facility] cognitive assessment test than I expected score in the low normal or high mild cognitive impairment level.   Therefore, I am more concerned about the hallucinations than the cognitive changes.   The etiology of her psychosis is uncertain. I  don't believe she has Alzheimer's disease and she does not have evidence of parkinsonism so Lewy body dementia is unlikely. She might have enough vascular ischemic changes noted on CT to have led to the psychosis.   Regardless, the psychosis is really impacting her life  and we need to try to get under better control. I will add hydroxyzine 10 mg 3 times a day. If she is unable to tolerate this or this does not help, I would consider changing her Seroquel at night to Risperdal 2-3 times a day.  She will return to see me in 2 months or sooner if she has new or worsening neurologic symptoms.   Ravenna Legore A. Epimenio FootSater, MD, PhD 12/02/2014, 10:07 AM Certified in Neurology, Clinical Neurophysiology, Sleep Medicine, Pain Medicine and Neuroimaging  [facility] [address], [address] [place], [place] [address] [phone]

## 2015-02-05 ENCOUNTER — Ambulatory Visit: Payer: Medicare HMO | Admitting: Neurology

## 2015-02-06 ENCOUNTER — Ambulatory Visit: Payer: Medicare HMO | Admitting: Neurology

## 2015-12-20 ENCOUNTER — Encounter (HOSPITAL_COMMUNITY): Payer: Self-pay

## 2015-12-20 ENCOUNTER — Emergency Department (HOSPITAL_COMMUNITY)
Admission: EM | Admit: 2015-12-20 | Discharge: 2015-12-20 | Disposition: A | Discharge status: Home / Self Care | Payer: Medicare HMO | Attending: Emergency Medicine | Admitting: Emergency Medicine

## 2015-12-20 ENCOUNTER — Emergency Department (HOSPITAL_COMMUNITY): Payer: Medicare HMO

## 2015-12-20 DIAGNOSIS — E785 Hyperlipidemia, unspecified: Secondary | ICD-10-CM | POA: Insufficient documentation

## 2015-12-20 DIAGNOSIS — Z955 Presence of coronary angioplasty implant and graft: Secondary | ICD-10-CM | POA: Insufficient documentation

## 2015-12-20 DIAGNOSIS — E109 Type 1 diabetes mellitus without complications: Secondary | ICD-10-CM | POA: Diagnosis not present

## 2015-12-20 DIAGNOSIS — Z8673 Personal history of transient ischemic attack (TIA), and cerebral infarction without residual deficits: Secondary | ICD-10-CM | POA: Diagnosis not present

## 2015-12-20 DIAGNOSIS — I10 Essential (primary) hypertension: Secondary | ICD-10-CM | POA: Insufficient documentation

## 2015-12-20 DIAGNOSIS — Z87891 Personal history of nicotine dependence: Secondary | ICD-10-CM | POA: Insufficient documentation

## 2015-12-20 DIAGNOSIS — I2510 Atherosclerotic heart disease of native coronary artery without angina pectoris: Secondary | ICD-10-CM | POA: Diagnosis not present

## 2015-12-20 DIAGNOSIS — R197 Diarrhea, unspecified: Secondary | ICD-10-CM | POA: Diagnosis present

## 2015-12-20 DIAGNOSIS — Z79899 Other long term (current) drug therapy: Secondary | ICD-10-CM | POA: Insufficient documentation

## 2015-12-20 DIAGNOSIS — R51 Headache: Secondary | ICD-10-CM | POA: Insufficient documentation

## 2015-12-20 DIAGNOSIS — R110 Nausea: Secondary | ICD-10-CM

## 2015-12-20 LAB — URINALYSIS, ROUTINE W REFLEX MICROSCOPIC
Bilirubin Urine: NEGATIVE
GLUCOSE, UA: NEGATIVE mg/dL
Ketones, ur: NEGATIVE mg/dL
LEUKOCYTES UA: NEGATIVE
NITRITE: NEGATIVE
PH: 7 (ref 5.0–8.0)
PROTEIN: NEGATIVE mg/dL
Specific Gravity, Urine: 1.009 (ref 1.005–1.030)

## 2015-12-20 LAB — COMPREHENSIVE METABOLIC PANEL
ALBUMIN: 4.4 g/dL (ref 3.5–5.0)
ALT: 27 U/L (ref 14–54)
AST: 41 U/L (ref 15–41)
Alkaline Phosphatase: 82 U/L (ref 38–126)
Anion gap: 9 (ref 5–15)
BUN: 12 mg/dL (ref 6–20)
CHLORIDE: 103 mmol/L (ref 101–111)
CO2: 24 mmol/L (ref 22–32)
CREATININE: 0.9 mg/dL (ref 0.44–1.00)
Calcium: 9.5 mg/dL (ref 8.9–10.3)
GFR calc Af Amer: >60 mL/min (ref >60)
GFR, EST NON AFRICAN AMERICAN: 59 mL/min — AB (ref >60)
GLUCOSE: 154 mg/dL — AB (ref 65–99)
Potassium: 3.7 mmol/L (ref 3.5–5.1)
Sodium: 136 mmol/L (ref 135–145)
Total Bilirubin: 0.8 mg/dL (ref 0.3–1.2)
Total Protein: 7.9 g/dL (ref 6.5–8.1)

## 2015-12-20 LAB — CBC WITH DIFFERENTIAL/PLATELET
BASOS PCT: 0 %
Basophils Absolute: 0 10*3/uL (ref 0.0–0.1)
EOS PCT: 1 %
Eosinophils Absolute: 0.1 10*3/uL (ref 0.0–0.7)
HEMATOCRIT: 41.5 % (ref 36.0–46.0)
HEMOGLOBIN: 13.2 g/dL (ref 12.0–15.0)
LYMPHS ABS: 1.8 10*3/uL (ref 0.7–4.0)
Lymphocytes Relative: 23 %
MCH: 28.3 pg (ref 26.0–34.0)
MCHC: 31.8 g/dL (ref 30.0–36.0)
MCV: 88.9 fL (ref 78.0–100.0)
MONOS PCT: 4 %
Monocytes Absolute: 0.3 10*3/uL (ref 0.1–1.0)
NEUTROS ABS: 5.5 10*3/uL (ref 1.7–7.7)
Neutrophils Relative %: 72 %
RBC: 4.67 MIL/uL (ref 3.87–5.11)
RDW: 12.9 % (ref 11.5–15.5)
WBC: 7.7 10*3/uL (ref 4.0–10.5)

## 2015-12-20 LAB — URINE MICROSCOPIC-ADD ON: BACTERIA UA: NONE SEEN

## 2015-12-20 LAB — LIPASE, BLOOD: Lipase: 30 U/L (ref 11–51)

## 2015-12-20 LAB — I-STAT TROPONIN, ED: Troponin i, poc: 0.01 ng/mL (ref 0.00–0.08)

## 2015-12-20 LAB — I-STAT CG4 LACTIC ACID, ED
LACTIC ACID, VENOUS: 1.62 mmol/L (ref 0.5–1.9)
Lactic Acid, Venous: 1.03 mmol/L (ref 0.5–1.9)
Lactic Acid, Venous: 2.82 mmol/L (ref 0.5–1.9)

## 2015-12-20 MED ORDER — DIPHENHYDRAMINE HCL 50 MG/ML IJ SOLN
25.0000 mg | Freq: Once | INTRAMUSCULAR | Status: AC
  Administered 2015-12-20: 25 mg via INTRAVENOUS
  Filled 2015-12-20: qty 1

## 2015-12-20 MED ORDER — ONDANSETRON HCL 4 MG/2ML IJ SOLN: 4.0000 mg | Freq: Once | INTRAMUSCULAR | Status: DC

## 2015-12-20 MED ORDER — ONDANSETRON 4 MG PO TBDP: 4.0000 mg | ORAL_TABLET | Freq: Three times a day (TID) | ORAL | 0 refills | Status: AC | PRN

## 2015-12-20 MED ORDER — SODIUM CHLORIDE 0.9 % IV BOLUS (SEPSIS)
500.0000 mL | Freq: Once | INTRAVENOUS | Status: AC
  Administered 2015-12-20: 500 mL via INTRAVENOUS

## 2015-12-20 MED ORDER — SODIUM CHLORIDE 0.9 % IV BOLUS (SEPSIS)
1000.0000 mL | Freq: Once | INTRAVENOUS | Status: AC
  Administered 2015-12-20: 1000 mL via INTRAVENOUS

## 2015-12-20 MED ORDER — PROCHLORPERAZINE EDISYLATE 5 MG/ML IJ SOLN
10.0000 mg | Freq: Once | INTRAMUSCULAR | Status: AC
  Administered 2015-12-20: 10 mg via INTRAVENOUS
  Filled 2015-12-20: qty 2

## 2015-12-20 MED ORDER — HYDROCHLOROTHIAZIDE 25 MG PO TABS
25.0000 mg | ORAL_TABLET | Freq: Every day | ORAL | Status: DC
  Administered 2015-12-20: 25 mg via ORAL
  Filled 2015-12-20: qty 1

## 2015-12-20 MED ORDER — LOSARTAN POTASSIUM 50 MG PO TABS
100.0000 mg | ORAL_TABLET | Freq: Every day | ORAL | Status: DC
  Administered 2015-12-20: 100 mg via ORAL
  Filled 2015-12-20 (×2): qty 2

## 2015-12-20 MED ORDER — LABETALOL HCL 5 MG/ML IV SOLN
10.0000 mg | Freq: Once | INTRAVENOUS | Status: DC
  Filled 2015-12-20: qty 4

## 2015-12-20 NOTE — ED Triage Notes (Signed)
Patient here with 1 day of nausea, diarrhea, headache and weakness. Patient states that she did not take her BP medication this am due to the nausea. Alert and oriented on arrival.

## 2015-12-20 NOTE — ED Notes (Signed)
Patient transported to CT 

## 2015-12-20 NOTE — ED Provider Notes (Signed)
[facility]-EMERGENCY DEPT Provider Note   CSN: [id number] Arrival date & time: 12/20/15  0998  First Provider Contact:  None       History   Chief Complaint Chief Complaint  Patient presents with  . Weakness  . Diarrhea    HPI Robyn [name] is a 81 y.o. female.  HPI  81 year old female with history of diabetes, hypertension, prior stroke presents for headache. She states that last night she developed some nausea and diarrhea. Diarrhea is described as a few episodes of loose green stool with no blood. She has not had any episodes of emesis. This morning she felt fatigued. She was unable to take her antihypertensives due to nausea. She developed a headache that is frontal, 9 of 10, bilateral, nonradiating, denies alleviating or exacerbating factors, says this is different than prior headaches. Denies numbness weakness shortness. Denies any recent falls.   Past Medical History:  Diagnosis Date  . Coronary artery disease   . Diabetes mellitus   . Hypertension   . Memory loss    just started on aricept  . Stroke (HCC)   . Vision abnormalities     Patient Active Problem List   Diagnosis Date Noted  . General psychoses 12/02/2014  . Psychosis 12/02/2014  . Mild cognitive impairment 12/02/2014  . DIABETES MELLITUS, TYPE I 07/16/2008  . HYPERLIPIDEMIA 07/16/2008  . Anxiety state 07/16/2008  . PHOBIA 07/16/2008  . GLAUCOMA 07/16/2008  . HYPERTENSION 07/16/2008  . CORONARY ARTERY DISEASE, S/P PTCA 07/16/2008  . ALLERGIC RHINITIS, SEASONAL 07/16/2008  . OSTEOARTHRITIS 07/16/2008  . HALLUCINATION 07/16/2008  . Insomnia 07/16/2008  . WEIGHT LOSS, ABNORMAL 07/16/2008  . DYSPNEA 07/16/2008  . PERSONAL HX TIA & CI W/O RESIDUAL DEFICITS 07/16/2008    Past Surgical History:  Procedure Laterality Date  . CORONARY ANGIOPLASTY WITH STENT PLACEMENT    . CORONARY STENT PLACEMENT      OB History    No data available       Home Medications    Prior to Admission medications     Medication Sig Start Date End Date Taking? Authorizing Provider  atorvastatin (LIPITOR) 20 MG tablet  11/06/14   Historical Provider, MD  hydrochlorothiazide (HYDRODIURIL) 25 MG tablet  11/06/14   Historical Provider, MD  hydrOXYzine (ATARAX/VISTARIL) 10 MG tablet Take 1 tablet (10 mg total) by mouth 3 (three) times daily. 12/02/14   Asa Lente, MD  insulin aspart protamine-insulin aspart (NOVOLOG 70/30) (70-30) 100 UNIT/ML injection Inject 25-35 Units into the skin daily with breakfast. Has in the morning according to blood sugars.    Historical Provider, MD  losartan (COZAAR) 100 MG tablet  11/06/14   Historical Provider, MD  NOVOLIN 70/30 RELION (70-30) 100 UNIT/ML injection Inject 25-35 Units into the skin daily with breakfast. 07/13/14   Historical Provider, MD  QUEtiapine (SEROQUEL) 25 MG tablet  11/06/14   Historical Provider, MD    Family History Family History  Problem Relation Age of Onset  . Hypertension Mother   . Diabetes Mother   . Hypertension Father   . Diabetes Father     Social History Social History  Substance Use Topics  . Smoking status: Former Smoker    Quit date: 10/08/1963  . Smokeless tobacco: Never Used  . Alcohol use No     Allergies   Review of patient's allergies indicates no known allergies.   Review of Systems Review of Systems  Constitutional: Positive for fatigue. Negative for chills and fever.  HENT: Negative for ear  pain and sore throat.   Eyes: Negative for pain and visual disturbance.  Respiratory: Negative for cough and shortness of breath.   Cardiovascular: Negative for chest pain and palpitations.  Gastrointestinal: Positive for diarrhea and nausea. Negative for abdominal pain and vomiting.  Genitourinary: Negative for dysuria and hematuria.  Musculoskeletal: Negative for arthralgias and back pain.  Skin: Negative for color change and rash.  Neurological: Positive for headaches. Negative for seizures and syncope.  All other systems  reviewed and are negative.    Physical Exam Updated Vital Signs BP (!) 228/77   Pulse 63   Temp 98.3 F (36.8 C) (Oral)   Resp 18   Ht 5' (1.524 m)   Wt 60.8 kg   SpO2 99%   BMI 26.17 kg/m   Physical Exam  Constitutional: She appears well-developed and well-nourished. No distress.  HENT:  Head: Normocephalic and atraumatic.  Eyes: Conjunctivae are normal.  Neck: Neck supple.  Cardiovascular: Normal rate and regular rhythm.   No murmur heard. Pulmonary/Chest: Effort normal and breath sounds normal. No respiratory distress.  Abdominal: Soft. There is no tenderness.  Musculoskeletal: She exhibits no edema.  Neurological: She is alert.  CN II-XII grossly intact Eyes: PERRL, EOMI Bilateral UE strength 5/5 Bilateral LE strength 5/5 Intact finger-to-nose  Skin: Skin is warm and dry.  Psychiatric: She has a normal mood and affect.  Nursing note and vitals reviewed.    ED Treatments / Results  Labs (all labs ordered are listed, but only abnormal results are displayed) Labs Reviewed  URINALYSIS, ROUTINE W REFLEX MICROSCOPIC (NOT AT [facility])  COMPREHENSIVE METABOLIC PANEL  LIPASE, BLOOD  CBC WITH DIFFERENTIAL/PLATELET  LACTIC ACID, PLASMA  LACTIC ACID, PLASMA  I-STAT TROPOININ, ED    EKG  EKG Interpretation None       Radiology No results found.  Procedures Procedures (including critical care time)  Medications Ordered in ED Medications  ondansetron (ZOFRAN) injection 4 mg (not administered)  sodium chloride 0.9 % bolus 500 mL (not administered)  hydrochlorothiazide (HYDRODIURIL) tablet 25 mg (not administered)  losartan (COZAAR) tablet 100 mg (not administered)     Initial Impression / Assessment and Plan / ED Course  I have reviewed the triage vital signs and the nursing notes.  Pertinent labs & imaging results that were available during my care of the patient were reviewed by me and considered in my medical decision making (see chart for  details).  Clinical Course    Patient with hypertension and headache.  Workup to r/o hypertensive urgency / emergency.  CT head negative.  CR at baseline.  No chest pain, EKG NSR without ischemic changes, and trop negative.  Headache resolved with migraine cocktail.  HTN improved with PO meds.  Regarding nausea and diarrhea, she has a benign abdominal exam.  Non-toxic appearing, no fevers, so doubt infection.  Doubt mesenteric ischemia based on benign abdominal exam.  Lactic acid mildly elevated, likely due to clinical dehydration.  Patient not tachycardic here, and lactic acid resolved with rehydration.  Appears well on rehydration.  Will encourage f/u with PCP.  Final Clinical Impressions(s) / ED Diagnoses   Final diagnoses:  None    New Prescriptions New Prescriptions   No medications on file     Marcelina Morel, MD 12/20/15 1710    Leta Baptist, MD 12/21/15 2103

## 2015-12-21 LAB — URINE CULTURE: Culture: <10000 — AB

## 2016-10-13 ENCOUNTER — Encounter (HOSPITAL_COMMUNITY): Payer: Self-pay

## 2016-10-13 ENCOUNTER — Emergency Department (HOSPITAL_COMMUNITY)
Admission: EM | Admit: 2016-10-13 | Discharge: 2016-10-13 | Disposition: A | Discharge status: Home / Self Care | Payer: Medicare PPO | Attending: Emergency Medicine | Admitting: Emergency Medicine

## 2016-10-13 ENCOUNTER — Emergency Department (HOSPITAL_COMMUNITY): Payer: Medicare PPO

## 2016-10-13 DIAGNOSIS — E109 Type 1 diabetes mellitus without complications: Secondary | ICD-10-CM | POA: Diagnosis not present

## 2016-10-13 DIAGNOSIS — S60411A Abrasion of left index finger, initial encounter: Secondary | ICD-10-CM | POA: Insufficient documentation

## 2016-10-13 DIAGNOSIS — Z8673 Personal history of transient ischemic attack (TIA), and cerebral infarction without residual deficits: Secondary | ICD-10-CM | POA: Diagnosis not present

## 2016-10-13 DIAGNOSIS — Y9289 Other specified places as the place of occurrence of the external cause: Secondary | ICD-10-CM | POA: Insufficient documentation

## 2016-10-13 DIAGNOSIS — W19XXXA Unspecified fall, initial encounter: Secondary | ICD-10-CM

## 2016-10-13 DIAGNOSIS — S80211A Abrasion, right knee, initial encounter: Secondary | ICD-10-CM | POA: Insufficient documentation

## 2016-10-13 DIAGNOSIS — N3000 Acute cystitis without hematuria: Secondary | ICD-10-CM

## 2016-10-13 DIAGNOSIS — S8991XA Unspecified injury of right lower leg, initial encounter: Secondary | ICD-10-CM | POA: Diagnosis present

## 2016-10-13 DIAGNOSIS — Z79899 Other long term (current) drug therapy: Secondary | ICD-10-CM | POA: Diagnosis not present

## 2016-10-13 DIAGNOSIS — Z794 Long term (current) use of insulin: Secondary | ICD-10-CM | POA: Insufficient documentation

## 2016-10-13 DIAGNOSIS — R51 Headache: Secondary | ICD-10-CM | POA: Diagnosis not present

## 2016-10-13 DIAGNOSIS — Y9389 Activity, other specified: Secondary | ICD-10-CM | POA: Insufficient documentation

## 2016-10-13 DIAGNOSIS — I10 Essential (primary) hypertension: Secondary | ICD-10-CM | POA: Diagnosis not present

## 2016-10-13 DIAGNOSIS — Y999 Unspecified external cause status: Secondary | ICD-10-CM | POA: Insufficient documentation

## 2016-10-13 DIAGNOSIS — W108XXA Fall (on) (from) other stairs and steps, initial encounter: Secondary | ICD-10-CM | POA: Insufficient documentation

## 2016-10-13 DIAGNOSIS — I2510 Atherosclerotic heart disease of native coronary artery without angina pectoris: Secondary | ICD-10-CM | POA: Insufficient documentation

## 2016-10-13 LAB — COMPREHENSIVE METABOLIC PANEL
ALK PHOS: 71 U/L (ref 38–126)
ALT: 15 U/L (ref 14–54)
ANION GAP: 9 (ref 5–15)
AST: 26 U/L (ref 15–41)
Albumin: 4.2 g/dL (ref 3.5–5.0)
BUN: 17 mg/dL (ref 6–20)
CALCIUM: 9.6 mg/dL (ref 8.9–10.3)
CO2: 23 mmol/L (ref 22–32)
CREATININE: 0.86 mg/dL (ref 0.44–1.00)
Chloride: 105 mmol/L (ref 101–111)
Glucose, Bld: 65 mg/dL (ref 65–99)
Potassium: 3.5 mmol/L (ref 3.5–5.1)
SODIUM: 137 mmol/L (ref 135–145)
TOTAL PROTEIN: 7.9 g/dL (ref 6.5–8.1)
Total Bilirubin: 0.5 mg/dL (ref 0.3–1.2)

## 2016-10-13 LAB — CBC WITH DIFFERENTIAL/PLATELET
Basophils Absolute: 0 10*3/uL (ref 0.0–0.1)
Basophils Relative: 0 %
EOS ABS: 0.1 10*3/uL (ref 0.0–0.7)
EOS PCT: 1 %
HCT: 39 % (ref 36.0–46.0)
HEMOGLOBIN: 12.9 g/dL (ref 12.0–15.0)
LYMPHS ABS: 2.2 10*3/uL (ref 0.7–4.0)
LYMPHS PCT: 32 %
MCH: 28.7 pg (ref 26.0–34.0)
MCHC: 33.1 g/dL (ref 30.0–36.0)
MCV: 86.9 fL (ref 78.0–100.0)
MONOS PCT: 6 %
Monocytes Absolute: 0.4 10*3/uL (ref 0.1–1.0)
Neutro Abs: 4.1 10*3/uL (ref 1.7–7.7)
Neutrophils Relative %: 61 %
PLATELETS: 236 10*3/uL (ref 150–400)
RBC: 4.49 MIL/uL (ref 3.87–5.11)
RDW: 13 % (ref 11.5–15.5)
WBC: 6.9 10*3/uL (ref 4.0–10.5)

## 2016-10-13 LAB — URINALYSIS, ROUTINE W REFLEX MICROSCOPIC
Bilirubin Urine: NEGATIVE
GLUCOSE, UA: NEGATIVE mg/dL
Ketones, ur: NEGATIVE mg/dL
NITRITE: NEGATIVE
PROTEIN: NEGATIVE mg/dL
Specific Gravity, Urine: 1.004 — ABNORMAL LOW (ref 1.005–1.030)
pH: 6 (ref 5.0–8.0)

## 2016-10-13 LAB — PROTIME-INR
INR: 0.99
Prothrombin Time: 13.1 seconds (ref 11.4–15.2)

## 2016-10-13 LAB — CBG MONITORING, ED: Glucose-Capillary: 77 mg/dL (ref 65–99)

## 2016-10-13 LAB — ETHANOL: Alcohol, Ethyl (B): <5 mg/dL (ref <5)

## 2016-10-13 MED ORDER — SODIUM CHLORIDE 0.9 % IV BOLUS (SEPSIS)
1000.0000 mL | Freq: Once | INTRAVENOUS | Status: AC
  Administered 2016-10-13: 1000 mL via INTRAVENOUS

## 2016-10-13 MED ORDER — CEPHALEXIN 250 MG PO CAPS
500.0000 mg | ORAL_CAPSULE | Freq: Once | ORAL | Status: AC
  Administered 2016-10-13: 500 mg via ORAL
  Filled 2016-10-13: qty 2

## 2016-10-13 MED ORDER — CEPHALEXIN 500 MG PO CAPS: 500.0000 mg | ORAL_CAPSULE | Freq: Two times a day (BID) | ORAL | 0 refills | Status: AC

## 2016-10-13 NOTE — ED Notes (Signed)
Patient transported to CT 

## 2016-10-13 NOTE — ED Notes (Signed)
This RN walked pt to RR pt. Felt dizzy. MD made aware.

## 2016-10-13 NOTE — Discharge Instructions (Signed)
Please take your antibiotics to treat or urinary tract infection. Please call and schedule a follow-up appointment with your primary care physician. If any symptoms change or worsen, please return to the nearest emergency department.

## 2016-10-13 NOTE — ED Triage Notes (Signed)
Pt BIB [facility]. Pt out shopping with family today when she "missed a step" and fell down on her knees and wrist. Pt c/o pain in knees with small hematoma to head. Pt denies being on blood thinners. Pt reports to EMS she was dizzy earlier today, stroke screen negative per EMS. Pt in [place].

## 2016-10-13 NOTE — ED Provider Notes (Signed)
[facility]-EMERGENCY DEPT Provider Note   CSN: [id number] Arrival date & time: 10/13/16  1549     History   Chief Complaint Chief Complaint  Patient presents with  . Fall  . Knee Pain    HPI Robyn [name] is a 82 y.o. female.  The history is provided by the patient, a relative and medical records.  Fall  This is a new problem. The current episode started 1 to 2 hours ago. The problem occurs constantly. The problem has not changed since onset.Associated symptoms include headaches. Pertinent negatives include no chest pain, no abdominal pain and no shortness of breath. Nothing aggravates the symptoms. Nothing relieves the symptoms. She has tried nothing for the symptoms. The treatment provided no relief.    Past Medical History:  Diagnosis Date  . Coronary artery disease   . Diabetes mellitus   . Hypertension   . Memory loss    just started on aricept  . Stroke (HCC)   . Vision abnormalities     Patient Active Problem List   Diagnosis Date Noted  . General psychoses 12/02/2014  . Psychosis 12/02/2014  . Mild cognitive impairment 12/02/2014  . DIABETES MELLITUS, TYPE I 07/16/2008  . HYPERLIPIDEMIA 07/16/2008  . Anxiety state 07/16/2008  . PHOBIA 07/16/2008  . GLAUCOMA 07/16/2008  . HYPERTENSION 07/16/2008  . CORONARY ARTERY DISEASE, S/P PTCA 07/16/2008  . ALLERGIC RHINITIS, SEASONAL 07/16/2008  . OSTEOARTHRITIS 07/16/2008  . HALLUCINATION 07/16/2008  . Insomnia 07/16/2008  . WEIGHT LOSS, ABNORMAL 07/16/2008  . DYSPNEA 07/16/2008  . PERSONAL HX TIA & CI W/O RESIDUAL DEFICITS 07/16/2008    Past Surgical History:  Procedure Laterality Date  . CORONARY ANGIOPLASTY WITH STENT PLACEMENT    . CORONARY STENT PLACEMENT      OB History    No data available       Home Medications    Prior to Admission medications   Medication Sig Start Date End Date Taking? Authorizing Provider  atorvastatin (LIPITOR) 20 MG tablet  11/06/14   [provider]    hydrochlorothiazide (HYDRODIURIL) 25 MG tablet  11/06/14   [provider]  insulin aspart protamine-insulin aspart (NOVOLOG 70/30) (70-30) 100 UNIT/ML injection Inject 25-35 Units into the skin daily with breakfast. Has in the morning according to blood sugars.    [provider]  losartan (COZAAR) 100 MG tablet  11/06/14   [provider]  NOVOLIN 70/30 RELION (70-30) 100 UNIT/ML injection Inject 35 Units into the skin daily with breakfast.  07/13/14   [provider]  QUEtiapine (SEROQUEL) 25 MG tablet  11/06/14   [provider]    Family History Family History  Problem Relation Age of Onset  . Hypertension Mother   . Diabetes Mother   . Hypertension Father   . Diabetes Father     Social History Social History  Substance Use Topics  . Smoking status: Former Smoker    Quit date: 10/08/1963  . Smokeless tobacco: Never Used  . Alcohol use No     Allergies   Patient has no known allergies.   Review of Systems Review of Systems  Constitutional: Negative for activity change, chills, diaphoresis, fatigue and fever.  HENT: Positive for nosebleeds. Negative for congestion, facial swelling, rhinorrhea, sore throat, trouble swallowing and voice change.   Eyes: Negative for visual disturbance.  Respiratory: Negative for cough, chest tightness, shortness of breath and stridor.   Cardiovascular: Negative for chest pain, palpitations and leg swelling.  Gastrointestinal: Negative for abdominal distention,  abdominal pain, constipation, diarrhea, nausea and vomiting.  Genitourinary: Negative for difficulty urinating, dysuria, flank pain, frequency, hematuria, menstrual problem, pelvic pain, vaginal bleeding and vaginal discharge.  Musculoskeletal: Negative for back pain, neck pain and neck stiffness.  Skin: Positive for wound. Negative for rash.  Neurological: Positive for headaches. Negative for dizziness, seizures, facial asymmetry, weakness,  light-headedness and numbness.  Psychiatric/Behavioral: Positive for hallucinations. Negative for agitation and confusion.  All other systems reviewed and are negative.    Physical Exam Updated Vital Signs Ht 5' (1.524 m)   Wt 58.5 kg (129 lb)   BMI 25.19 kg/m   Physical Exam  Constitutional: She is oriented to person, place, and time. She appears well-developed and well-nourished. No distress.  HENT:  Head:    Right Ear: Tympanic membrane normal. No hemotympanum.  Left Ear: Tympanic membrane normal. No hemotympanum.  Nose: Sinus tenderness present. No nasal deformity, septal deviation or nasal septal hematoma. Epistaxis is observed.  Mouth/Throat: Oropharynx is clear and moist. No oropharyngeal exudate.  Eyes: Conjunctivae and EOM are normal. Pupils are equal, round, and reactive to light.  Neck: No spinous process tenderness and no muscular tenderness present.  In C collar   Cardiovascular: Normal rate and regular rhythm.   No murmur heard. Pulmonary/Chest: Effort normal and breath sounds normal. No stridor. No respiratory distress. She has no wheezes. She has no rales. She exhibits no tenderness.  Abdominal: Soft. There is no tenderness.  Musculoskeletal: She exhibits tenderness. She exhibits no edema.       Left hand: She exhibits tenderness. She exhibits normal range of motion, normal capillary refill, no deformity and no laceration. Normal sensation noted. Normal strength noted.       Hands:      Left lower leg: She exhibits tenderness.       Legs: Neurological: She is alert and oriented to person, place, and time. No cranial nerve deficit or sensory deficit. She exhibits normal muscle tone.  Skin: Skin is warm and dry. Capillary refill takes less than 2 seconds. No rash noted. She is not diaphoretic. No erythema.  Psychiatric: She has a normal mood and affect.  Nursing note and vitals reviewed.    ED Treatments / Results  Labs (all labs ordered are listed, but only  abnormal results are displayed) Labs Reviewed  URINALYSIS, ROUTINE W REFLEX MICROSCOPIC - Abnormal; Notable for the following:       Result Value   Color, Urine STRAW (*)    Specific Gravity, Urine 1.004 (*)    Hgb urine dipstick SMALL (*)    Leukocytes, UA LARGE (*)    Bacteria, UA RARE (*)    Squamous Epithelial / LPF 0-5 (*)    Non Squamous Epithelial 0-5 (*)    All other components within normal limits  PROTIME-INR  CBC WITH DIFFERENTIAL/PLATELET  COMPREHENSIVE METABOLIC PANEL  ETHANOL  CBG MONITORING, ED    EKG  EKG Interpretation  Date/Time:  Thursday Oct 13 2016 16:18:40 EDT Ventricular Rate:  65 PR Interval:    QRS Duration: 90 QT Interval:  571 QTC Calculation: 594 R Axis:   -11 Text Interpretation:  Sinus rhythm Low voltage, precordial leads Borderline T abnormalities, anterior leads Prolonged QT interval When compared to prior, longer QT.  No STEMI Confirmed by Theda Belfast ([id number]) on 10/13/2016 6:15:31 PM       Radiology Dg Chest 2 View  Result Date: 10/13/2016 CLINICAL DATA:  Fall EXAM: CHEST  2 VIEW COMPARISON:  12/23/2007 FINDINGS: Low volume  chest. There is no edema, consolidation, effusion, or pneumothorax. Negative mediastinal contours allowing for technique. Coronary stent noted. No detected fracture. IMPRESSION: Negative low volume chest. Electronically Signed   By: Marnee Spring M.D.   On: 10/13/2016 17:13   Ct Head Wo Contrast  Result Date: 10/13/2016 CLINICAL DATA:  82 year old female status post fall today when she missed a step. Hematoma on head. Dizziness. EXAM: CT HEAD WITHOUT CONTRAST CT MAXILLOFACIAL WITHOUT CONTRAST CT CERVICAL SPINE WITHOUT CONTRAST TECHNIQUE: Multidetector CT imaging of the head, cervical spine, and maxillofacial structures were performed using the standard protocol without intravenous contrast. Multiplanar CT image reconstructions of the cervical spine and maxillofacial structures were also generated. COMPARISON:  Head CTs  without contrast 12/20/2015 and earlier. FINDINGS: CT HEAD FINDINGS Brain: Stable cerebral volume. No midline shift, ventriculomegaly, mass effect, evidence of mass lesion, intracranial hemorrhage or evidence of cortically based acute infarction. Mild for age white matter hypodensity. Otherwise normal gray-white matter differentiation. Vascular: Calcified atherosclerosis at the skull base. Skull: Stable.  No acute osseous abnormality identified. Other: Orbits soft tissues appear normal aside from mildly disc conjugate gaze. No focal scalp hematoma identified. CT MAXILLOFACIAL FINDINGS Osseous: Absent dentition except for an impacted right mandible wisdom tooth. Mandible intact. No maxilla or zygoma fracture. Nasal bones appear stable. Central skullbase appears intact. Orbits: Negative.  Orbital walls intact. Sinuses: Visualized paranasal sinuses and mastoids are stable and well pneumatized. Soft tissues: Negative visualized noncontrast larynx, pharynx, parapharyngeal spaces, retropharyngeal space, sublingual space, submandibular glands and parotid glands. Calcified carotid atherosclerosis in the neck greater on the left. CT CERVICAL SPINE FINDINGS Alignment: Straightening of cervical lordosis. Bilateral posterior element alignment is within normal limits. Cervicothoracic junction alignment is within normal limits. Skull base and vertebrae: Visualized skull base is intact. No atlanto-occipital dissociation. No cervical spine fracture identified. Soft tissues and spinal canal: No prevertebral fluid or swelling. No visible canal hematoma. Disc levels: Intermittent cervical spine degeneration but no spinal stenosis. Upper chest: Visible upper thoracic levels appear intact. Negative lung apices except for dependent atelectasis. Calcified aortic atherosclerosis. Tortuous proximal great vessels. IMPRESSION: 1. No acute traumatic injury identified. 2.  Stable non contrast CT appearance of the brain. 3. No acute fracture or  listhesis in the cervical spine. No facial fracture identified. 4. Calcified aortic and carotid artery atherosclerosis. Electronically Signed   By: Odessa Fleming M.D.   On: 10/13/2016 18:05   Ct Cervical Spine Wo Contrast  Result Date: 10/13/2016 CLINICAL DATA:  82 year old female status post fall today when she missed a step. Hematoma on head. Dizziness. EXAM: CT HEAD WITHOUT CONTRAST CT MAXILLOFACIAL WITHOUT CONTRAST CT CERVICAL SPINE WITHOUT CONTRAST TECHNIQUE: Multidetector CT imaging of the head, cervical spine, and maxillofacial structures were performed using the standard protocol without intravenous contrast. Multiplanar CT image reconstructions of the cervical spine and maxillofacial structures were also generated. COMPARISON:  Head CTs without contrast 12/20/2015 and earlier. FINDINGS: CT HEAD FINDINGS Brain: Stable cerebral volume. No midline shift, ventriculomegaly, mass effect, evidence of mass lesion, intracranial hemorrhage or evidence of cortically based acute infarction. Mild for age white matter hypodensity. Otherwise normal gray-white matter differentiation. Vascular: Calcified atherosclerosis at the skull base. Skull: Stable.  No acute osseous abnormality identified. Other: Orbits soft tissues appear normal aside from mildly disc conjugate gaze. No focal scalp hematoma identified. CT MAXILLOFACIAL FINDINGS Osseous: Absent dentition except for an impacted right mandible wisdom tooth. Mandible intact. No maxilla or zygoma fracture. Nasal bones appear stable. Central skullbase appears intact. Orbits: Negative.  Orbital walls intact. Sinuses: Visualized paranasal sinuses and mastoids are stable and well pneumatized. Soft tissues: Negative visualized noncontrast larynx, pharynx, parapharyngeal spaces, retropharyngeal space, sublingual space, submandibular glands and parotid glands. Calcified carotid atherosclerosis in the neck greater on the left. CT CERVICAL SPINE FINDINGS Alignment: Straightening of  cervical lordosis. Bilateral posterior element alignment is within normal limits. Cervicothoracic junction alignment is within normal limits. Skull base and vertebrae: Visualized skull base is intact. No atlanto-occipital dissociation. No cervical spine fracture identified. Soft tissues and spinal canal: No prevertebral fluid or swelling. No visible canal hematoma. Disc levels: Intermittent cervical spine degeneration but no spinal stenosis. Upper chest: Visible upper thoracic levels appear intact. Negative lung apices except for dependent atelectasis. Calcified aortic atherosclerosis. Tortuous proximal great vessels. IMPRESSION: 1. No acute traumatic injury identified. 2.  Stable non contrast CT appearance of the brain. 3. No acute fracture or listhesis in the cervical spine. No facial fracture identified. 4. Calcified aortic and carotid artery atherosclerosis. Electronically Signed   By: Odessa Fleming M.D.   On: 10/13/2016 18:05   Dg Knee Complete 4 Views Right  Result Date: 10/13/2016 CLINICAL DATA:  Right knee pain after missing a step and falling down today. EXAM: RIGHT KNEE - COMPLETE 4+ VIEW COMPARISON:  None. FINDINGS: Moderate anterior and mild posterior superior patellar spur formation. No fracture, dislocation or effusion. Dense arterial calcifications. IMPRESSION: 1. No fracture. 2. Dense atheromatous arterial calcifications. 3. Patellar spurs. Electronically Signed   By: Beckie Salts M.D.   On: 10/13/2016 17:16   Dg Hand Complete Left  Result Date: 10/13/2016 CLINICAL DATA:  Left hand pain after missing a step and falling down today. EXAM: LEFT HAND - COMPLETE 3+ VIEW COMPARISON:  None. FINDINGS: Unremarkable bones with no fracture or dislocation seen. Dense arterial calcifications. IMPRESSION: No fracture. Electronically Signed   By: Beckie Salts M.D.   On: 10/13/2016 17:14   Ct Maxillofacial Wo Contrast  Result Date: 10/13/2016 CLINICAL DATA:  82 year old female status post fall today when she  missed a step. Hematoma on head. Dizziness. EXAM: CT HEAD WITHOUT CONTRAST CT MAXILLOFACIAL WITHOUT CONTRAST CT CERVICAL SPINE WITHOUT CONTRAST TECHNIQUE: Multidetector CT imaging of the head, cervical spine, and maxillofacial structures were performed using the standard protocol without intravenous contrast. Multiplanar CT image reconstructions of the cervical spine and maxillofacial structures were also generated. COMPARISON:  Head CTs without contrast 12/20/2015 and earlier. FINDINGS: CT HEAD FINDINGS Brain: Stable cerebral volume. No midline shift, ventriculomegaly, mass effect, evidence of mass lesion, intracranial hemorrhage or evidence of cortically based acute infarction. Mild for age white matter hypodensity. Otherwise normal gray-white matter differentiation. Vascular: Calcified atherosclerosis at the skull base. Skull: Stable.  No acute osseous abnormality identified. Other: Orbits soft tissues appear normal aside from mildly disc conjugate gaze. No focal scalp hematoma identified. CT MAXILLOFACIAL FINDINGS Osseous: Absent dentition except for an impacted right mandible wisdom tooth. Mandible intact. No maxilla or zygoma fracture. Nasal bones appear stable. Central skullbase appears intact. Orbits: Negative.  Orbital walls intact. Sinuses: Visualized paranasal sinuses and mastoids are stable and well pneumatized. Soft tissues: Negative visualized noncontrast larynx, pharynx, parapharyngeal spaces, retropharyngeal space, sublingual space, submandibular glands and parotid glands. Calcified carotid atherosclerosis in the neck greater on the left. CT CERVICAL SPINE FINDINGS Alignment: Straightening of cervical lordosis. Bilateral posterior element alignment is within normal limits. Cervicothoracic junction alignment is within normal limits. Skull base and vertebrae: Visualized skull base is intact. No atlanto-occipital dissociation. No cervical spine fracture identified. Soft tissues and  spinal canal: No  prevertebral fluid or swelling. No visible canal hematoma. Disc levels: Intermittent cervical spine degeneration but no spinal stenosis. Upper chest: Visible upper thoracic levels appear intact. Negative lung apices except for dependent atelectasis. Calcified aortic atherosclerosis. Tortuous proximal great vessels. IMPRESSION: 1. No acute traumatic injury identified. 2.  Stable non contrast CT appearance of the brain. 3. No acute fracture or listhesis in the cervical spine. No facial fracture identified. 4. Calcified aortic and carotid artery atherosclerosis. Electronically Signed   By: Odessa Fleming M.D.   On: 10/13/2016 18:05    Procedures Procedures (including critical care time)  Medications Ordered in ED Medications  sodium chloride 0.9 % bolus 1,000 mL (0 mLs Intravenous Stopped 10/13/16 2259)  cephALEXin (KEFLEX) capsule 500 mg (500 mg Oral Given 10/13/16 2259)     Initial Impression / Assessment and Plan / ED Course  I have reviewed the triage vital signs and the nursing notes.  Pertinent labs & imaging results that were available during my care of the patient were reviewed by me and considered in my medical decision making (see chart for details).     Robyn [name] is a 82 y.o. female with a past medical history significant for diabetes, hypertension, hyperlipidemia, CAD status post PCI, stroke, and prior psychosis with voices who presents with a fall. Patient is accompanied by family who reports that patient was walking on a curb today when she stepped off it and fell. She reports that it was a mechanical fall and she struck her left face, nose, left hand, and right leg on the ground. Patient did not lose consciousness. Patient has had no vision changes nausea or vomiting. She denies any chest pain, shortness of breath, abdominal pain. She denies any hip pain. She has a small abrasion on her right knee. She also has abrasion on her left hand. Patient has dried epistaxis of the left and there  was no nasal septal hematoma. Patient has some tenderness on her nose and left face.  History and exam are seen above. On exam, patient has dried epistaxis in the left nares was no nasal septal hematoma. No evidence of blood in the ears. Normal extraocular movements and normal pupil reactions. Tenderness on the left face with no significant bruising or swelling. Mild nose tenderness. No jaw tenderness. Patient in cervical collar. Patient was rolled and had no significant tenderness to the cervical, thoracic, or lumbar spine. No chest or abdominal tenderness. Mild tenderness on the right knee. Normal sensation and strength in the bilateral lower extremities. Small abrasion on the right knee.  Patient is not on blood thinners. Patient will have imaging of the head, neck, face, left hand, and right knee. Patient will also have chest x-ray. Patient given some pain medicine. If imaging is reassuring, suspect patient was stable for discharge.   Family reports that the psychosis voices told her to go to the store that she was in however, this is normal for her. Patient appears to be at her baseline otherwise.         CT imaging reassuring. Labs show UTI. Pt reports some frequency on reassessment.   Pt given dose of abx and will be given prescription of abx. PT given return precautions and understood follow-up instructions.   PT and fam had no other concerns and pt was discharged in good condition.     Final Clinical Impressions(s) / ED Diagnoses   Final diagnoses:  Fall, initial encounter  Acute cystitis without hematuria  New Prescriptions Discharge Medication List as of 10/13/2016 10:40 PM    START taking these medications   Details  cephALEXin (KEFLEX) 500 MG capsule Take 1 capsule (500 mg total) by mouth 2 (two) times daily., Starting Thu 10/13/2016, Until Thu 10/20/2016, Print        Clinical Impression: 1. Fall, initial encounter   2. Acute cystitis without hematuria      Disposition: Discharge  Condition: Good  I have discussed the results, Dx and Tx plan with the pt(& family if present). He/she/they expressed understanding and agree(s) with the plan. Discharge instructions discussed at great length. Strict return precautions discussed and pt &/or family have verbalized understanding of the instructions. No further questions at time of discharge.    Discharge Medication List as of 10/13/2016 10:40 PM    START taking these medications   Details  cephALEXin (KEFLEX) 500 MG capsule Take 1 capsule (500 mg total) by mouth 2 (two) times daily., Starting Thu 10/13/2016, Until Thu 10/20/2016, Print        Follow Up: [facility] [address] [place] [address] [phone] Schedule an appointment as soon as possible for a visit    [facility] EMERGENCY DEPARTMENT [address] [id number] mc [place] [address] [phone]  If symptoms worsen     Tegeler, Canary Brim, MD 10/14/16 [phone]

## 2017-11-22 ENCOUNTER — Other Ambulatory Visit: Payer: Self-pay | Admitting: *Deleted

## 2017-11-22 DIAGNOSIS — M8580 Other specified disorders of bone density and structure, unspecified site: Secondary | ICD-10-CM

## 2018-02-27 ENCOUNTER — Inpatient Hospital Stay
Admission: RE | Admit: 2018-02-27 | Discharge: 2018-02-27 | Disposition: A | Discharge status: Home / Self Care | Payer: Medicare HMO | Source: Ambulatory Visit | Attending: *Deleted | Admitting: *Deleted

## 2019-03-04 ENCOUNTER — Other Ambulatory Visit: Payer: Self-pay | Admitting: *Deleted

## 2019-03-04 ENCOUNTER — Ambulatory Visit
Admission: RE | Admit: 2019-03-04 | Discharge: 2019-03-04 | Disposition: A | Discharge status: Home / Self Care | Payer: Medicare PPO | Source: Ambulatory Visit | Attending: *Deleted | Admitting: *Deleted

## 2019-03-04 DIAGNOSIS — S3690XA Unspecified injury of unspecified intra-abdominal organ, initial encounter: Secondary | ICD-10-CM

## 2019-03-04 MED ORDER — IOPAMIDOL (ISOVUE-300) INJECTION 61%
100.0000 mL | Freq: Once | INTRAVENOUS | Status: AC | PRN
  Administered 2019-03-04: 100 mL via INTRAVENOUS

## 2019-06-24 ENCOUNTER — Emergency Department (HOSPITAL_COMMUNITY)
Admission: EM | Admit: 2019-06-24 | Discharge: 2019-06-25 | Disposition: A | Discharge status: Home / Self Care | Payer: Medicare PPO | Attending: Emergency Medicine | Admitting: Emergency Medicine

## 2019-06-24 ENCOUNTER — Encounter (HOSPITAL_COMMUNITY): Payer: Self-pay | Admitting: Emergency Medicine

## 2019-06-24 ENCOUNTER — Other Ambulatory Visit: Payer: Self-pay

## 2019-06-24 DIAGNOSIS — Z87891 Personal history of nicotine dependence: Secondary | ICD-10-CM | POA: Diagnosis not present

## 2019-06-24 DIAGNOSIS — I2510 Atherosclerotic heart disease of native coronary artery without angina pectoris: Secondary | ICD-10-CM | POA: Insufficient documentation

## 2019-06-24 DIAGNOSIS — R404 Transient alteration of awareness: Secondary | ICD-10-CM | POA: Insufficient documentation

## 2019-06-24 DIAGNOSIS — Z955 Presence of coronary angioplasty implant and graft: Secondary | ICD-10-CM | POA: Diagnosis not present

## 2019-06-24 DIAGNOSIS — R443 Hallucinations, unspecified: Secondary | ICD-10-CM | POA: Insufficient documentation

## 2019-06-24 DIAGNOSIS — E162 Hypoglycemia, unspecified: Secondary | ICD-10-CM

## 2019-06-24 DIAGNOSIS — Z79899 Other long term (current) drug therapy: Secondary | ICD-10-CM | POA: Insufficient documentation

## 2019-06-24 DIAGNOSIS — Z8673 Personal history of transient ischemic attack (TIA), and cerebral infarction without residual deficits: Secondary | ICD-10-CM | POA: Diagnosis not present

## 2019-06-24 DIAGNOSIS — R42 Dizziness and giddiness: Secondary | ICD-10-CM | POA: Diagnosis present

## 2019-06-24 DIAGNOSIS — E10649 Type 1 diabetes mellitus with hypoglycemia without coma: Secondary | ICD-10-CM | POA: Insufficient documentation

## 2019-06-24 LAB — CBG MONITORING, ED: Glucose-Capillary: 145 mg/dL — ABNORMAL HIGH (ref 70–99)

## 2019-06-24 NOTE — ED Provider Notes (Signed)
[facility]-EMERGENCY DEPT Provider Note   CSN: [id number] Arrival date & time: 06/24/19  2211     History Chief Complaint  Patient presents with  . Hallucinations  . Hypoglycemia    Robyn [name] is a 85 y.o. female.  HPI     This is an 85 year old female with a history of coronary artery disease, diabetes, hypertension, stroke who presents with dizziness and hypoglycemia.  Per EMS report she called out on life alert.  She was found to have a blood glucose of 50.  Patient was given food with improvement of her blood sugar.  Patient reports that she normally takes her insulin once a day in the morning.  She states that she last took her insulin around 11 AM.  She does not take any short acting insulin with meals.  She reports that she has had good oral intake.  On their evaluation she seemed to be hallucinating.  Patient reports that she remembers thinking that her children were there and they were not.  She has no history of hallucinations.  Currently she is awake, alert, and oriented.  She states over the last several days she has had intermittent dizziness.  She describes it both as lightheadedness and room spinning.  No nausea or vomiting.  Denies any speech difficulty, weakness, numbness, strokelike symptoms.  She is not on any anticoagulants.  Currently she is mostly asymptomatic with the exception of feeling "foggy headed."  Denies headache.  Denies chest pain, shortness of breath, fevers, recent illnesses or Covid exposure.  Past Medical History:  Diagnosis Date  . Coronary artery disease   . Diabetes mellitus   . Hypertension   . Memory loss    just started on aricept  . Stroke (HCC)   . Vision abnormalities     Patient Active Problem List   Diagnosis Date Noted  . General psychoses (HCC) 12/02/2014  . Psychosis (HCC) 12/02/2014  . Mild cognitive impairment 12/02/2014  . DIABETES MELLITUS, TYPE I 07/16/2008  . HYPERLIPIDEMIA 07/16/2008  . Anxiety  state 07/16/2008  . PHOBIA 07/16/2008  . GLAUCOMA 07/16/2008  . HYPERTENSION 07/16/2008  . CORONARY ARTERY DISEASE, S/P PTCA 07/16/2008  . ALLERGIC RHINITIS, SEASONAL 07/16/2008  . OSTEOARTHRITIS 07/16/2008  . HALLUCINATION 07/16/2008  . Insomnia 07/16/2008  . WEIGHT LOSS, ABNORMAL 07/16/2008  . DYSPNEA 07/16/2008  . PERSONAL HX TIA & CI W/O RESIDUAL DEFICITS 07/16/2008    Past Surgical History:  Procedure Laterality Date  . CORONARY ANGIOPLASTY WITH STENT PLACEMENT    . CORONARY STENT PLACEMENT       OB History   No obstetric history on file.     Family History  Problem Relation Age of Onset  . Hypertension Mother   . Diabetes Mother   . Hypertension Father   . Diabetes Father     Social History   Tobacco Use  . Smoking status: Former Smoker    Quit date: 10/08/1963    Years since quitting: 55.7  . Smokeless tobacco: Never Used  Substance Use Topics  . Alcohol use: No  . Drug use: No    Home Medications Prior to Admission medications   Medication Sig Start Date End Date Taking? Authorizing Provider  amLODipine (NORVASC) 10 MG tablet Take 10 mg by mouth daily. 05/15/19  Yes [provider]  atorvastatin (LIPITOR) 20 MG tablet Take 20 mg by mouth daily.  11/06/14  Yes [provider]  insulin aspart protamine-insulin aspart (NOVOLOG 70/30) (70-30) 100 UNIT/ML injection  Inject 25 Units into the skin daily with breakfast. Has in the morning according to blood sugars.   Yes [provider]    Allergies    Patient has no known allergies.  Review of Systems   Review of Systems  Constitutional: Negative for fever.  Respiratory: Negative for shortness of breath.   Cardiovascular: Negative for chest pain.  Gastrointestinal: Negative for abdominal pain, diarrhea, nausea and vomiting.  Genitourinary: Negative for dysuria.  Neurological: Positive for dizziness and light-headedness. Negative for speech difficulty, weakness and numbness.   Psychiatric/Behavioral: Positive for confusion.  All other systems reviewed and are negative.   Physical Exam Updated Vital Signs BP (!) 140/57   Pulse (!) 57   Temp 97.6 F (36.4 C) (Oral)   Resp 12   SpO2 99%   Physical Exam Vitals and nursing note reviewed.  Constitutional:      General: She is not in acute distress.    Appearance: She is well-developed. She is not ill-appearing.  HENT:     Head: Normocephalic and atraumatic.     Nose: Nose normal.     Mouth/Throat:     Mouth: Mucous membranes are moist.  Eyes:     Pupils: Pupils are equal, round, and reactive to light.     Comments: No nystagmus noted  Cardiovascular:     Rate and Rhythm: Normal rate and regular rhythm.     Heart sounds: Normal heart sounds.  Pulmonary:     Effort: Pulmonary effort is normal. No respiratory distress.     Breath sounds: No wheezing.  Abdominal:     General: Bowel sounds are normal.     Palpations: Abdomen is soft.     Tenderness: There is no abdominal tenderness.  Musculoskeletal:     Cervical back: Neck supple.     Right lower leg: No edema.     Left lower leg: No edema.  Skin:    General: Skin is warm and dry.  Neurological:     Mental Status: She is alert and oriented to person, place, and time.     Comments: Cranial nerves II through XII intact, 5 out of 5 strength in all 4 extremities, no dysmetria to finger-nose-finger, fluent speech, alert and oriented x3  Psychiatric:        Mood and Affect: Mood normal.     ED Results / Procedures / Treatments   Labs (all labs ordered are listed, but only abnormal results are displayed) Labs Reviewed  CBC WITH DIFFERENTIAL/PLATELET - Abnormal; Notable for the following components:      Result Value   WBC 11.2 (*)    Neutro Abs 8.6 (*)    All other components within normal limits  BASIC METABOLIC PANEL - Abnormal; Notable for the following components:   Glucose, Bld 133 (*)    GFR calc non Af Amer 52 (*)    All other  components within normal limits  URINALYSIS, ROUTINE W REFLEX MICROSCOPIC - Abnormal; Notable for the following components:   Color, Urine STRAW (*)    Hgb urine dipstick MODERATE (*)    All other components within normal limits  CBG MONITORING, ED - Abnormal; Notable for the following components:   Glucose-Capillary 145 (*)    All other components within normal limits  CBG MONITORING, ED - Abnormal; Notable for the following components:   Glucose-Capillary 61 (*)    All other components within normal limits  CBG MONITORING, ED - Abnormal; Notable for the following components:  Glucose-Capillary 67 (*)    All other components within normal limits  CBG MONITORING, ED - Abnormal; Notable for the following components:   Glucose-Capillary 113 (*)    All other components within normal limits    EKG EKG Interpretation  Date/Time:  Monday June 24 2019 23:05:33 EST Ventricular Rate:  61 PR Interval:    QRS Duration: 96 QT Interval:  456 QTC Calculation: 460 R Axis:   -9 Text Interpretation: Sinus rhythm Left ventricular hypertrophy Inferior infarct, old Probable anterior infarct, age indeterminate Poor tracing Confirmed by Ross Marcus ([id number]) on 06/24/2019 11:25:55 PM   Radiology CT Head Wo Contrast  Result Date: 06/25/2019 CLINICAL DATA:  Acute headache with normal neuro exam EXAM: CT HEAD WITHOUT CONTRAST TECHNIQUE: Contiguous axial images were obtained from the base of the skull through the vertex without intravenous contrast. COMPARISON:  10/13/2016 FINDINGS: Brain: No evidence of acute infarction, hemorrhage, or mass. There are disproportionate sulci but no ventriculomegaly to implicate normal pressure hydrocephalus and no rounded CSF collection to implicate a hygroma. Cerebral volume loss is mild for age, as is small-vessel ischemic type change in the white matter. Vascular: Atherosclerotic calcification Skull: Negative Sinuses/Orbits: Negative IMPRESSION: No acute finding or  change from 2018. Electronically Signed   By: Marnee Spring M.D.   On: 06/25/2019 06:08    Procedures Procedures (including critical care time)  Medications Ordered in ED Medications  sodium chloride 0.9 % bolus 500 mL (0 mLs Intravenous Stopped 06/25/19 0315)  sodium chloride 0.9 % bolus 500 mL (0 mLs Intravenous Stopped 06/25/19 0525)  acetaminophen (TYLENOL) tablet 650 mg (650 mg Oral Given 06/25/19 1779)    ED Course  I have reviewed the triage vital signs and the nursing notes.  Pertinent labs & imaging results that were available during my care of the patient were reviewed by me and considered in my medical decision making (see chart for details).  Clinical Course as of Jun 25 1048  Tue Jun 25, 2019  3903 Unable to obtain a urine sample after multiple attempts at catheterization.  Patient states she is unable to pee at this time.  She denies any history of difficulty peeing at home.  She is given a total of 1 L of fluid.  She is now also complaining of a frontal headache.  Will obtain CT scan.  Patient given Tylenol.   [CH]    Clinical Course User Index [CH] Zeeva Courser, Mayer Masker, MD   MDM Rules/Calculators/A&P                      Patient with reported confusion and hypoglycemia.  AOO x3 for me.  Recent dizziness.  Repeat BS after eating reassuring.  EKG without ischemia or arrythmia. Neuro exam is reassuring and without evidence of stroke.   Basic labwork reassuring.  Patient having difficulty providing a urine and difficulty with catherization.  Given fluids.  Not orthostatic.    F/u CBG and ua.   Final Clinical Impression(s) / ED Diagnoses Final diagnoses:  Hypoglycemia  Transient alteration of awareness    Rx / DC Orders ED Discharge Orders    None       Shon Baton, MD 06/26/19 1053

## 2019-06-24 NOTE — ED Triage Notes (Signed)
85 yo female from home called EMS via medical alarm knecklace, complaining of low blood sugar. Initial cbg of 50 per EMS. Pt was given some food and drink and cbg came up to 83. EMS reports that pt then started to have hallucinations of objects and people that werent there. Pt is AOx4, Pt was able to identify that she does not have a history of hallucinations. Pt is also complaining of non radiating headache 4/10. Pt reports dizziness x 3 days. She stated that she has history of heart problems and stroke. 12 lead ekg negative per EMS, stroke screen also negative per EMS. EMS reports 3rd blood sugar taken was 94.  VITALs: 142/76 bp 74 -hr 18- rr 98% on room air - spo02 97.0 tympanic temp

## 2019-06-25 ENCOUNTER — Emergency Department (HOSPITAL_COMMUNITY): Payer: Medicare PPO

## 2019-06-25 LAB — BASIC METABOLIC PANEL
Anion gap: 10 (ref 5–15)
BUN: 22 mg/dL (ref 8–23)
CO2: 23 mmol/L (ref 22–32)
Calcium: 9.6 mg/dL (ref 8.9–10.3)
Chloride: 104 mmol/L (ref 98–111)
Creatinine, Ser: 1 mg/dL (ref 0.44–1.00)
GFR calc Af Amer: >60 mL/min (ref >60)
GFR calc non Af Amer: 52 mL/min — ABNORMAL LOW (ref >60)
Glucose, Bld: 133 mg/dL — ABNORMAL HIGH (ref 70–99)
Potassium: 4 mmol/L (ref 3.5–5.1)
Sodium: 137 mmol/L (ref 135–145)

## 2019-06-25 LAB — URINALYSIS, ROUTINE W REFLEX MICROSCOPIC
Bacteria, UA: NONE SEEN
Bilirubin Urine: NEGATIVE
Glucose, UA: NEGATIVE mg/dL
Ketones, ur: NEGATIVE mg/dL
Leukocytes,Ua: NEGATIVE
Nitrite: NEGATIVE
Protein, ur: NEGATIVE mg/dL
Specific Gravity, Urine: 1.01 (ref 1.005–1.030)
pH: 7 (ref 5.0–8.0)

## 2019-06-25 LAB — CBC WITH DIFFERENTIAL/PLATELET
Abs Immature Granulocytes: 0.07 10*3/uL (ref 0.00–0.07)
Basophils Absolute: 0 10*3/uL (ref 0.0–0.1)
Basophils Relative: 0 %
Eosinophils Absolute: 0.1 10*3/uL (ref 0.0–0.5)
Eosinophils Relative: 1 %
HCT: 40.4 % (ref 36.0–46.0)
Hemoglobin: 13.1 g/dL (ref 12.0–15.0)
Immature Granulocytes: 1 %
Lymphocytes Relative: 16 %
Lymphs Abs: 1.8 10*3/uL (ref 0.7–4.0)
MCH: 29.3 pg (ref 26.0–34.0)
MCHC: 32.4 g/dL (ref 30.0–36.0)
MCV: 90.4 fL (ref 80.0–100.0)
Monocytes Absolute: 0.6 10*3/uL (ref 0.1–1.0)
Monocytes Relative: 5 %
Neutro Abs: 8.6 10*3/uL — ABNORMAL HIGH (ref 1.7–7.7)
Neutrophils Relative %: 77 %
Platelets: 282 10*3/uL (ref 150–400)
RBC: 4.47 MIL/uL (ref 3.87–5.11)
RDW: 13.2 % (ref 11.5–15.5)
WBC: 11.2 10*3/uL — ABNORMAL HIGH (ref 4.0–10.5)
nRBC: 0 % (ref 0.0–0.2)

## 2019-06-25 LAB — CBG MONITORING, ED
Glucose-Capillary: 61 mg/dL — ABNORMAL LOW (ref 70–99)
Glucose-Capillary: 67 mg/dL — ABNORMAL LOW (ref 70–99)
Glucose-Capillary: 113 mg/dL — ABNORMAL HIGH (ref 70–99)

## 2019-06-25 MED ORDER — SODIUM CHLORIDE 0.9 % IV BOLUS
500.0000 mL | Freq: Once | INTRAVENOUS | Status: AC
  Administered 2019-06-25: 05:00:00 500 mL via INTRAVENOUS

## 2019-06-25 MED ORDER — ACETAMINOPHEN 325 MG PO TABS
650.0000 mg | ORAL_TABLET | Freq: Once | ORAL | Status: AC
  Administered 2019-06-25: 650 mg via ORAL
  Filled 2019-06-25: qty 2

## 2019-06-25 MED ORDER — SODIUM CHLORIDE 0.9 % IV BOLUS
500.0000 mL | Freq: Once | INTRAVENOUS | Status: AC
  Administered 2019-06-25: 500 mL via INTRAVENOUS

## 2019-06-25 NOTE — Discharge Instructions (Signed)
He was seen in the emergency department today with low blood sugar. Please do not take your insulin today and you can restart taking your insulin tomorrow. Please continue to eat and drink throughout the day as normal and call your primary care doctor to schedule the next available follow-up appointment. If you develop any new or suddenly worsening symptoms please return to the emergency department immediately.

## 2019-06-25 NOTE — ED Notes (Signed)
Checked purewick for urine output, but canister was empty.

## 2019-06-25 NOTE — ED Notes (Signed)
Patient's BG 61. EDP Horton made aware. Patient given orange juice and graham crackers with peanut butter. Will continue to monitor.

## 2019-06-25 NOTE — ED Notes (Signed)
Assisted RN Lucy with I/O cath, which was unsuccessful.  Bladder scanned pt x2 with first result showing 22mL and second reading showing 6mL.

## 2019-06-25 NOTE — ED Notes (Signed)
In and out cath attempted but unsuccessful, subsequent bladder scan revealed 45ml on first scan and 15 mls on second scan, will wait until pt finishes fluid bolus and try again later.

## 2019-06-25 NOTE — ED Notes (Signed)
Attempted in and out cath, but was unsuccessful x2. Attempted with Jeanice Lim, RN and [place], [place].

## 2019-06-25 NOTE — ED Provider Notes (Signed)
Blood pressure (!) 153/58, pulse 62, temperature 97.6 F (36.4 C), temperature source Oral, resp. rate 17, SpO2 100 %.  Assuming care from Dr. Wilkie Aye.  In short, Robyn [name] is a 85 y.o. female with a chief complaint of Hallucinations and Hypoglycemia .  Refer to the original H&P for additional details.  The current plan of care is to follow up on UA and CBG.  09:15 AM  UA with no evidence. Patient is feeling well and well-appearing. Repeat CBG is 113. Patient is eating and feeling well. She is not confused or experiencing hallucinations at this time. Like this was from low blood sugar overnight. She will hold her insulin today and restart tomorrow. Advised that she call her PCP to schedule a follow-up appointment in the coming week and return to the emergency department with any new or suddenly worsening symptoms. Patient agrees and is comfortable with the plan at discharge.     Maia Plan, MD 06/25/19 [phone]

## 2019-07-25 ENCOUNTER — Ambulatory Visit: Payer: Medicare PPO

## 2019-08-05 ENCOUNTER — Ambulatory Visit: Payer: Medicare PPO

## 2019-09-05 ENCOUNTER — Ambulatory Visit: Payer: Medicare PPO | Attending: Internal Medicine

## 2019-09-05 DIAGNOSIS — Z23 Encounter for immunization: Secondary | ICD-10-CM

## 2019-09-05 NOTE — Progress Notes (Signed)
   Covid-19 Vaccination Clinic  Name:  Robyn [name]    MRN: [id number] DOB: 11/30/1934  09/05/2019  [name] was observed post Covid-19 immunization for 15 minutes without incident. She was provided with Vaccine Information Sheet and instruction to access the V-Safe system.   [name] was instructed to call 911 with any severe reactions post vaccine: Marland Kitchen Difficulty breathing  . Swelling of face and throat  . A fast heartbeat  . A bad rash all over body  . Dizziness and weakness   Immunizations Administered    Name Date Dose VIS Date Route   Pfizer COVID-19 Vaccine 09/05/2019 11:07 AM 0.3 mL 05/03/2019 Intramuscular   Manufacturer: [employer], [employer]   Lot: [id number]   NDC: [address]-7

## 2019-09-30 ENCOUNTER — Ambulatory Visit: Payer: Medicare PPO | Attending: Internal Medicine

## 2019-09-30 DIAGNOSIS — Z23 Encounter for immunization: Secondary | ICD-10-CM

## 2019-09-30 NOTE — Progress Notes (Signed)
   Covid-19 Vaccination Clinic  Name:  Robyn [name]    MRN: [id number] DOB: Nov 18, 1934  09/30/2019  Ms. Elsasser was observed post Covid-19 immunization for 15 minutes without incident. She was provided with Vaccine Information Sheet and instruction to access the V-Safe system.   Ms. Boniface was instructed to call 911 with any severe reactions post vaccine: Marland Kitchen Difficulty breathing  . Swelling of face and throat  . A fast heartbeat  . A bad rash all over body  . Dizziness and weakness   Immunizations Administered    Name Date Dose VIS Date Route   Pfizer COVID-19 Vaccine 09/30/2019 11:09 AM 0.3 mL 07/17/2018 Intramuscular   Manufacturer: [employer], [employer]   Lot: [id number]   NDC: [address]-9

## 2020-02-28 ENCOUNTER — Encounter: Payer: Self-pay | Admitting: Neurology

## 2020-03-02 ENCOUNTER — Other Ambulatory Visit: Payer: Self-pay

## 2020-03-02 ENCOUNTER — Ambulatory Visit (INDEPENDENT_AMBULATORY_CARE_PROVIDER_SITE_OTHER): Payer: Medicare PPO | Admitting: Podiatry

## 2020-03-02 DIAGNOSIS — L600 Ingrowing nail: Secondary | ICD-10-CM | POA: Diagnosis not present

## 2020-03-02 DIAGNOSIS — M79675 Pain in left toe(s): Secondary | ICD-10-CM

## 2020-03-02 DIAGNOSIS — L97522 Non-pressure chronic ulcer of other part of left foot with fat layer exposed: Secondary | ICD-10-CM | POA: Diagnosis not present

## 2020-03-02 DIAGNOSIS — M79674 Pain in right toe(s): Secondary | ICD-10-CM

## 2020-03-02 DIAGNOSIS — B351 Tinea unguium: Secondary | ICD-10-CM

## 2020-03-02 DIAGNOSIS — L03032 Cellulitis of left toe: Secondary | ICD-10-CM

## 2020-03-02 DIAGNOSIS — E1042 Type 1 diabetes mellitus with diabetic polyneuropathy: Secondary | ICD-10-CM | POA: Diagnosis not present

## 2020-03-02 DIAGNOSIS — L02612 Cutaneous abscess of left foot: Secondary | ICD-10-CM

## 2020-03-02 NOTE — Progress Notes (Signed)
  Subjective:  Patient ID: Robyn [name], female    DOB: 1934-06-24,  MRN: [id number]  Chief Complaint  Patient presents with  . Nail Problem    PT is type 2 diabetic. The nail is growing weird     85 y.o. female presents with the above complaint. History confirmed with patient.  She is here with her daughter today.  Her daughter went to visit and she had a very long very curved left hallux toenail which has curved back and punctured through the skin and causing wound.  She thinks it may be infected.  She was prescribed Keflex by her PCP and referred to [place].  Objective:  Physical Exam: warm, good capillary refill, reduced left and +2 DP.  Left hallux nail lateral border is severely gryphotic and incurvated and curved back and extend to the plantar skin pulp causing a 0.4 x 0.4 x 0.6 cm deep ulceration to the subcutaneous tissue.  Assessment:   1. Onychomycosis   2. Pain due to onychomycosis of toenails of both feet   3. Ingrowing left great toenail   4. Skin ulcer of left great toe with fat layer exposed (HCC)   5. Type 1 diabetes mellitus with diabetic polyneuropathy (HCC)      Plan:  Patient was evaluated and treated and all questions answered.  -Discussed the patient that the severely incurvated ingrown and gryphotic nail discussed a full-thickness ulceration to the subcutaneous tissue.  Following local anesthesia with 1.5 cc each of 2% Xylocaine and 0.5% Marcaine plain in a digital block fashion the left hallux nail was debrided and the offending nail border was removed with debridement of the ulceration performed with a sharp sterile curette of all nonviable tissue to the level of the subcutaneous tissue.  She tolerated procedure well  -Recommend a change daily dressings with mupirocin ointment which was prescribed for them  -Complete Keflex course prescribed at her PCP  Procedure: Excisional Debridement of Wound Indication: Removal of non-viable soft tissue from the wound to  promote healing.  Anesthesia: none Pre-Debridement Wound Measurements: Indeterminate due to nail puncture Post-Debridement Wound Measurements: 0.4 cm x 0.4 cm x 0.6 cm  Type of Debridement: Sharp Excisional Tissue Removed: Non-viable soft tissue Instrumentation: 15 blade and tissue nipper Depth of Debridement: subcutaneous tissue. Technique: Sharp excisional debridement to bleeding, viable wound base.  Dressing: Dry, sterile, compression dressing. Disposition: Patient tolerated procedure well. Patient to return in 1 week for follow-up.    Return in about 2 weeks (around 03/16/2020).

## 2020-03-03 ENCOUNTER — Encounter: Payer: Self-pay | Admitting: Podiatry

## 2020-03-03 MED ORDER — MUPIROCIN 2 % EX OINT: 1.0000 "application " | TOPICAL_OINTMENT | Freq: Two times a day (BID) | CUTANEOUS | 2 refills | Status: DC

## 2020-03-20 ENCOUNTER — Ambulatory Visit (INDEPENDENT_AMBULATORY_CARE_PROVIDER_SITE_OTHER): Payer: Medicare PPO | Admitting: Podiatry

## 2020-03-20 ENCOUNTER — Other Ambulatory Visit: Payer: Self-pay

## 2020-03-20 DIAGNOSIS — L97522 Non-pressure chronic ulcer of other part of left foot with fat layer exposed: Secondary | ICD-10-CM | POA: Diagnosis not present

## 2020-03-20 DIAGNOSIS — B351 Tinea unguium: Secondary | ICD-10-CM

## 2020-03-22 ENCOUNTER — Encounter: Payer: Self-pay | Admitting: Podiatry

## 2020-03-22 NOTE — Progress Notes (Signed)
  Subjective:  Patient ID: Robyn [name], female    DOB: [birth year],  MRN: [id number]  No chief complaint on file.   85 y.o. female she returns for follow-up of her left hallux toenail which is incurvated and causing ulceration.  She is here today with her daughter.  She is almost finished the Keflex prescription.  She feels well.  Minimal pain.  Objective:  Physical Exam: warm, good capillary refill, reduced left and +2 DP.  Ulceration is largely healed and is filled with dried blood.  No hematomas noted.  No signs of infection.  Assessment:   1. Onychomycosis   2. Skin ulcer of left great toe with fat layer exposed (HCC)      Plan:  Patient was evaluated and treated and all questions answered.  -She has had significant improvement.  Recommending continue mupirocin application daily.  Finish Keflex course.  Follow-up for final wound check in 4 weeks.  She will then need to be on a regular schedule for nail debridement    Return in about 4 weeks (around 04/17/2020) for wound re-check.

## 2020-04-03 ENCOUNTER — Other Ambulatory Visit: Payer: Self-pay | Admitting: *Deleted

## 2020-04-03 ENCOUNTER — Other Ambulatory Visit (HOSPITAL_COMMUNITY): Payer: Self-pay | Admitting: *Deleted

## 2020-04-21 ENCOUNTER — Ambulatory Visit: Payer: Medicare PPO | Admitting: Podiatry

## 2020-04-21 ENCOUNTER — Other Ambulatory Visit: Payer: Self-pay

## 2020-04-21 DIAGNOSIS — B351 Tinea unguium: Secondary | ICD-10-CM | POA: Diagnosis not present

## 2020-04-22 ENCOUNTER — Encounter: Payer: Self-pay | Admitting: Podiatry

## 2020-04-22 MED ORDER — CICLOPIROX 8 % EX SOLN: Freq: Every day | CUTANEOUS | 2 refills | Status: DC

## 2020-04-22 NOTE — Progress Notes (Signed)
  Subjective:  Patient ID: Robyn [name], female    DOB: [birth year],  MRN: [id number]  Chief Complaint  Patient presents with  . Wound Check    Left hallux wound check, PT stated that she is doing good. Right hallux nail PT is concenrned about fungus     85 y.o. female she returns for follow-up of her left hallux toenail which is incurvated and causing ulceration.  She is here today with her daughter.  The wound remains healed.  They are concerned about nail fungus on the right hallux  Objective:  Physical Exam: warm, good capillary refill, reduced left and +2 DP.  Ulceration i remains healed without signs of infection.  The right hallux has significant onychomycosis  Assessment:   1. Onychomycosis      Plan:  Patient was evaluated and treated and all questions answered.  -Wound remains healed.  We should see her regular basis for at risk diabetic foot care to prevent this from happening in the future to keep the nails reduced and appropriate length  -Discussed topical and oral treatment options for onychomycosis.  I recommended topical treatment with Penlac.  Prescription was sent to the pharmacy.  For the right hallux    Return in about 3 months (around 07/20/2020) for [facility]

## 2020-04-23 ENCOUNTER — Other Ambulatory Visit: Payer: Self-pay | Admitting: *Deleted

## 2020-04-23 DIAGNOSIS — M810 Age-related osteoporosis without current pathological fracture: Secondary | ICD-10-CM

## 2020-04-27 ENCOUNTER — Other Ambulatory Visit: Payer: Self-pay

## 2020-04-27 ENCOUNTER — Ambulatory Visit
Admission: RE | Admit: 2020-04-27 | Discharge: 2020-04-27 | Disposition: A | Discharge status: Home / Self Care | Payer: Medicare PPO | Source: Ambulatory Visit | Attending: *Deleted | Admitting: *Deleted

## 2020-04-27 DIAGNOSIS — M810 Age-related osteoporosis without current pathological fracture: Secondary | ICD-10-CM

## 2020-05-14 ENCOUNTER — Encounter: Payer: Self-pay | Admitting: Neurology

## 2020-05-14 ENCOUNTER — Ambulatory Visit: Payer: Medicare PPO | Admitting: Neurology

## 2020-06-05 ENCOUNTER — Encounter: Payer: Self-pay | Admitting: Neurology

## 2020-06-05 ENCOUNTER — Ambulatory Visit: Payer: Medicare PPO | Admitting: Neurology

## 2020-06-05 ENCOUNTER — Other Ambulatory Visit: Payer: Self-pay

## 2020-06-05 VITALS — BP 133/77 | HR 68 | Resp 18 | Ht 60.0 in | Wt 124.0 lb

## 2020-06-05 DIAGNOSIS — R413 Other amnesia: Secondary | ICD-10-CM

## 2020-06-05 DIAGNOSIS — R440 Auditory hallucinations: Secondary | ICD-10-CM | POA: Diagnosis not present

## 2020-06-05 DIAGNOSIS — R2689 Other abnormalities of gait and mobility: Secondary | ICD-10-CM | POA: Diagnosis not present

## 2020-06-05 MED ORDER — RISPERIDONE 0.25 MG PO TABS: ORAL_TABLET | ORAL | 11 refills | Status: DC

## 2020-06-05 NOTE — Patient Instructions (Addendum)
1. Schedule MRI brain with and without contrast  2. Schedule routine EEG  3. Schedule Neurocognitive testing  4. Start Risperdal 0.25mg : take 1 tablet every night for 1 week, then increase to 1 tablet twice a day  5. Recommend increased supervision at home. Home PT and nursing will be ordered as well  6. Follow-up in 6 months, call for any changes  We have sent a referral to [facility] for your MRI and they will call you directly to schedule your appointment. They are located at [address] [facility]. If you need to contact them directly please call [phone].

## 2020-06-05 NOTE — Progress Notes (Signed)
NEUROLOGY CONSULTATION NOTE  Robyn [name] MRN: [id number] DOB: 15-May-1935  Referring provider: Lindaann Pascal, PA-C Primary care provider: Marva Panda, NP  Reason for consult:  dementia   Thank you for your kind referral of Robyn [name] for consultation of the above symptoms. Although her history is well known to you, please allow me to reiterate it for the purpose of our medical record. The patient was accompanied to the clinic by her daughter Robyn [name] who also provides collateral information. Records and images were personally reviewed where available.   HISTORY OF PRESENT ILLNESS: This is an 86 year old right-handed woman with a history of hypertension, hyperlipidmiea, diabetes, dementia, presenting for evaluation of dementia. On her PCP appointment in October 2021, daughter reported she is often disoriented to place and time, and hears voices telling her what to do. She lives alone and her daughters check on her multiple times per week and help with housework. She was evaluated by neurologist Dr. Epimenio Foot for similar symptoms in 2016. Symptoms had started around 2014. At that time, hallucinations were primarily auditory but sometimes visual. She also reported sleep difficulty with the voicees speaking to her when she tries to fall asleep. She had side effects of nausea on Donepezil and was prescribed Memantine and Seroquel. MOCA 26/30 in 11/2014. Etiology of symptoms at that time unclear, she did not have features of LBD or AD. She was started on hydroxyzine then was lost to follow-up.  Over the years, [name] reports that the auditory hallucinations have worsened. The voices would tell her to go outside sometimes, as far as Robyn [name] knows she has not wandered outside. She reports heaviness in her truncal region, that she has gained weight, [name] states she thinks the people are causing the heaviness behind her. The voices tell her that her laundry is not worth keeping. When they are  walking, she wants to go a different way because the voices are telling her to go that way. She would offer food when she is eating. She goes to the bathroom and would be heard saying leave her alone. She would stand and stare, and when asked what she is doing, she says she is talking to them. Initially she stated she does not hear anything, then [name] reminds her it occurs all the time, she talks to them and waves while she is at [employer]. She states they are in a group, in her house, and can be scary for her, keeping her up at night. She only gets 1-2 hours of sleep at night and naps in the day. She recalls Seroquel (and maybe hydroxyzine) causing dizziness in the past. She says she took these for a long time, but was lost to follow-up.   [name] thinks her memory is okay. She continues to live alone. She states her memory is "sometimes not very well." She does not drive. She continues to manage her own medications and finances, they deny any issues. She denies leaving the stove on. She has occasional word-finding difficulties. There is no clear REM behavior disorder. She has occasional hand tremors. She always feel like her balance is off, no falls. She denies any headaches, diplopia, dysarthria/dysphagia, neck pain, focal numbness/tingling/weakness, bowel/bladder dysfunction, anosmia. There is no family history of dementia, no history of significant head injuries or alcohol use.    Laboratory Data: EKG in 06/2019 showed a QTc of 460  I personally reviewed head CTs done over the years, most recently in 06/2019 which showed moderate diffuse atrophy, there  appears to be more in the right frontal and occipital regions.   PAST MEDICAL HISTORY: Past Medical History:  Diagnosis Date  . Coronary artery disease   . Diabetes mellitus   . Hypertension   . Memory loss    just started on aricept  . Stroke (HCC)   . Vision abnormalities     PAST SURGICAL HISTORY: Past Surgical History:  Procedure Laterality  Date  . CORONARY ANGIOPLASTY WITH STENT PLACEMENT    . CORONARY STENT PLACEMENT      MEDICATIONS: Current Outpatient Medications on File Prior to Visit  Medication Sig Dispense Refill  . amLODipine (NORVASC) 10 MG tablet Take 10 mg by mouth daily.    . insulin aspart protamine-insulin aspart (NOVOLOG 70/30) (70-30) 100 UNIT/ML injection Inject 25 Units into the skin daily with breakfast. Has in the morning according to blood sugars.    Marland Kitchen NOVOLIN 70/30 RELION (70-30) 100 UNIT/ML injection     . atorvastatin (LIPITOR) 20 MG tablet Take 20 mg by mouth daily.  (Patient not taking: Reported on 06/05/2020)    . cephALEXin (KEFLEX) 500 MG capsule  (Patient not taking: Reported on 06/05/2020)    . ciclopirox (PENLAC) 8 % solution Apply topically at bedtime. Apply over nail and surrounding skin. Apply daily over previous coat. After seven (7) days, may remove with alcohol and continue cycle. (Patient not taking: Reported on 06/05/2020) 6.6 mL 2  . meloxicam (MOBIC) 7.5 MG tablet meloxicam 7.5 mg tablet (Patient not taking: Reported on 06/05/2020)    . mupirocin ointment (BACTROBAN) 2 % Apply 1 application topically 2 (two) times daily. (Patient not taking: Reported on 06/05/2020) 30 g 2   No current facility-administered medications on file prior to visit.    ALLERGIES: No Known Allergies  FAMILY HISTORY: Family History  Problem Relation Age of Onset  . Hypertension Mother   . Diabetes Mother   . Hypertension Father   . Diabetes Father     SOCIAL HISTORY: Social History   Socioeconomic History  . Marital status: Widowed    Spouse name: Not on file  . Number of children: Not on file  . Years of education: 36  . Highest education level: Not on file  Occupational History  . Occupation: retired  Tobacco Use  . Smoking status: Former Smoker    Quit date: 10/08/1963    Years since quitting: 56.6  . Smokeless tobacco: Never Used  Vaping Use  . Vaping Use: Not on file  Substance and Sexual  Activity  . Alcohol use: No  . Drug use: No  . Sexual activity: Not on file  Other Topics Concern  . Not on file  Social History Narrative   Right handed   decaff   Apartment no steps   Social Determinants of Health   Financial Resource Strain: Not on file  Food Insecurity: Not on file  Transportation Needs: Not on file  Physical Activity: Not on file  Stress: Not on file  Social Connections: Not on file  Intimate Partner Violence: Not on file     PHYSICAL EXAM: Vitals:   06/05/20 1026  BP: 133/77  Pulse: 68  Resp: 18  SpO2: 98%   General: No acute distress Head:  Normocephalic/atraumatic Skin/Extremities: No rash, no edema Neurological Exam: Mental status: alert and oriented to person, city/state, and time, no dysarthria or aphasia, Fund of knowledge is reduced.  Recent and remote memory are impaired.  Attention and concentration are reduced.    Able to  name objects and repeat phrases. MMSE 15/30 MMSE - Mini Mental State Exam 06/05/2020  Orientation to time 5  Orientation to Place 2  Registration 0  Attention/ Calculation 0  Recall 0  Language- name 2 objects 2  Language- repeat 1  Language- follow 3 step command 3  Language- read & follow direction 1  Write a sentence 1  Copy design 0  Total score 15    Cranial nerves: CN I: not tested CN II: pupils equal, round and reactive to light, visual fields intact CN III, IV, VI:  full range of motion, no nystagmus, no ptosis CN V: facial sensation intact CN VII: upper and lower face symmetric CN VIII: hearing intact to conversation CN IX, X: gag intact, uvula midline CN XI: sternocleidomastoid and trapezius muscles intact CN XII: tongue midline Bulk & Tone: +cogwheeling with distraction, no fasciculations. Motor: 5/5 throughout with no pronator drift. Sensation: intact to light touch, cold.  Romberg test negative Deep Tendon Reflexes: +1 throughout Cerebellar: no incoordination on finger to nose  testing Gait: able to rise from chair with arms crossed over chest, takes small steps reporting heaviness in her mid-section, fair arm swing Tremor: there is occasional resting tremor noted on the right index finger, no postural or action tremor seen Good finger and foot taps; +postural instability   IMPRESSION: This is an 86 year old right-handed woman with a history of hypertension, hyperlipidmiea, diabetes, dementia, presenting for evaluation of dementia. Her neurological exam shows some cogwheeling, occasional right index finger resting tremor, postural instability. MMSE today 15/30. Her daughter's main concern has been the auditory hallucinations that she feels has worsened over the years with the voices telling her what to do/where to go. Etiology of symptoms unclear, there is no prior history of psychiatric illness. Lewy body dementia is a consideration, there are soft parkinsonian signs on her exam. She will be scheduled for an MRI brain with and without contrast to assess for underlying structural abnormality and Neuropsychological testing to further evaluate cognitive concerns. Her daughter also reports staring episodes, a routine EEG will be done. We discussed medications that may help with the hallucinations, she reports side effects on Seroquel and hydroxyzine, will start low dose Risperdal 0.25mg  qhs x 1 week, then increase to 1 tab BID. We discussed the cardiac black box warning in detail and how it applies in this case.  Family believes that QOL is important and they have tried nonpharmacologic therapies (maintaining schedule, attempting proper sleep hydration, redirecting, etc) without success.  They would like to continue to caregive in the home and believe that this is the only way, but worry that pt is a threat to self/others.  After this long discussion, family decided to try the medication as they feel that the benefits outweigh the risks in this case. We discussed home safety, she will  be referred for home PT for balance and home health nursing to ensure medication compliance. Family also instructed to check behind her. Follow-up in 6 months, they know to call for any changes.    Thank you for allowing me to participate in the care of this patient. Please do not hesitate to call for any questions or concerns.   Patrcia Dolly, M.D.  CC: Lorin Picket Long, PA-C, Marva Panda, NP

## 2020-06-22 ENCOUNTER — Other Ambulatory Visit: Payer: Self-pay

## 2020-06-22 ENCOUNTER — Ambulatory Visit: Payer: Medicare PPO | Admitting: Neurology

## 2020-06-22 DIAGNOSIS — R413 Other amnesia: Secondary | ICD-10-CM

## 2020-06-22 DIAGNOSIS — R440 Auditory hallucinations: Secondary | ICD-10-CM | POA: Diagnosis not present

## 2020-06-29 NOTE — Procedures (Signed)
ELECTROENCEPHALOGRAM REPORT  Date of Study: 06/22/2020  Patient's Name: Robyn [name] MRN: [id number] Date of Birth: 1934/08/14  Referring Provider: Dr. Patrcia Dolly  Clinical History: This is an 86 year old woman with auditory hallucinations, staring episodes.  Medications: NORVASC 10 MG tablet NOVOLOG 70/30) (70-30) 100 UNIT/ML injection NOVOLIN 70/30 RELION (70-30) 100 UNIT/ML injection PENLAC 8 % solution   Technical Summary: A multichannel digital EEG recording measured by the international 10-20 system with electrodes applied with paste and impedances below 5000 ohms performed in our laboratory with EKG monitoring in an awake patient.  Hyperventilation was not performed. Photic stimulation was performed.  The digital EEG was referentially recorded, reformatted, and digitally filtered in a variety of bipolar and referential montages for optimal display.    Description: The patient is awake during the recording.  During maximal wakefulness, there is a symmetric, medium voltage 10 Hz posterior dominant rhythm that attenuates with eye opening.  The record is symmetric. Sleep was not captured. Photic stimulation did not elicit any abnormalities.  There were no epileptiform discharges or electrographic seizures seen.    EKG lead showed sinus bradycardia at 60 bpm.  Impression: This awake EEG is normal.    Clinical Correlation: A normal EEG does not exclude a clinical diagnosis of epilepsy.  If further clinical questions remain, prolonged EEG may be helpful.  Clinical correlation is advised.   Patrcia Dolly, M.D.

## 2020-06-30 ENCOUNTER — Telehealth: Payer: Self-pay

## 2020-06-30 NOTE — Telephone Encounter (Signed)
Pt daughter called no answer left a voice mail to call the office back  

## 2020-06-30 NOTE — Telephone Encounter (Signed)
-----   Message from Karen M Aquino, MD sent at 06/30/2020  9:53 AM EST ----- Pls let daughter know the brain wave test was normal. Proceed with brain MRI and memory testing as discussed, thanks 

## 2020-07-02 ENCOUNTER — Telehealth: Payer: Self-pay

## 2020-07-02 NOTE — Telephone Encounter (Signed)
-----   Message from Karen M Aquino, MD sent at 06/30/2020  9:53 AM EST ----- Pls let daughter know the brain wave test was normal. Proceed with brain MRI and memory testing as discussed, thanks 

## 2020-07-02 NOTE — Telephone Encounter (Signed)
Pt called no answer left a voice mail to call the office back  °

## 2020-07-08 ENCOUNTER — Telehealth: Payer: Self-pay

## 2020-07-08 NOTE — Telephone Encounter (Signed)
-----   Message from Karen M Aquino, MD sent at 06/30/2020  9:53 AM EST ----- Pls let daughter know the brain wave test was normal. Proceed with brain MRI and memory testing as discussed, thanks 

## 2020-07-08 NOTE — Telephone Encounter (Signed)
Pt called no answer left a voice mail to call the office to go over test results

## 2020-07-10 ENCOUNTER — Telehealth: Payer: Self-pay

## 2020-07-10 NOTE — Telephone Encounter (Signed)
-----   Message from Van Clines, MD sent at 06/30/2020  9:53 AM EST ----- Pls let daughter know the brain wave test was normal. Proceed with brain MRI and memory testing as discussed, thanks

## 2020-07-10 NOTE — Telephone Encounter (Signed)
Pt daughter called left a voice mail for her to call the office back

## 2020-07-10 NOTE — Telephone Encounter (Signed)
Spoke with pt daughter [name]  informed her that pt brain wave test was normal. Proceed with brain MRI and memory testing as discussed. Pt daughter given number to [facility] so they can schedule MRI

## 2020-07-10 NOTE — Telephone Encounter (Signed)
-----   Message from Karen M Aquino, MD sent at 06/30/2020  9:53 AM EST ----- Pls let daughter know the brain wave test was normal. Proceed with brain MRI and memory testing as discussed, thanks 

## 2020-07-20 ENCOUNTER — Ambulatory Visit: Payer: Medicare PPO | Admitting: Podiatry

## 2020-07-25 IMAGING — CT CT HEAD W/O CM
3 of 4 series · 15 of 47 positions shown, 18 images · non-contrast
Comparison: 10/13/2016

CLINICAL DATA: Acute headache with normal neuro exam

EXAM:
CT HEAD WITHOUT CONTRAST
TECHNIQUE: Contiguous axial images were obtained from the base of the skull
through the vertex without intravenous contrast.

[Series 2: head wo · axial · 0.47mm/px · z∈[-106,+19]mm · 9 of 31 slices shown, 12 images]
[im 3/31  brain]
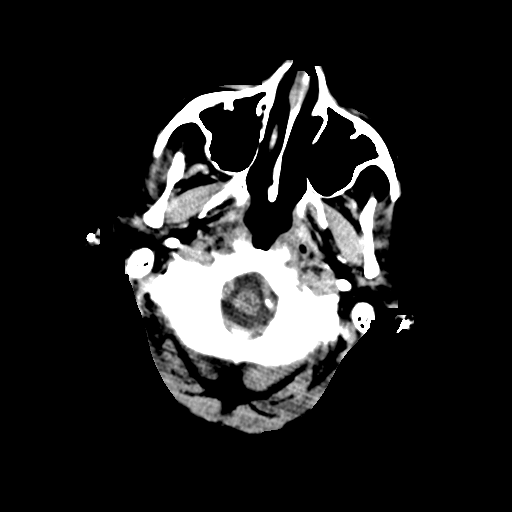
[im 3/31  bone]
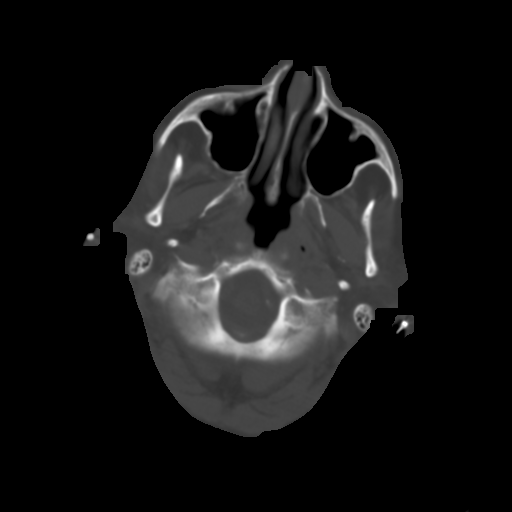
[im 7/31  brain]
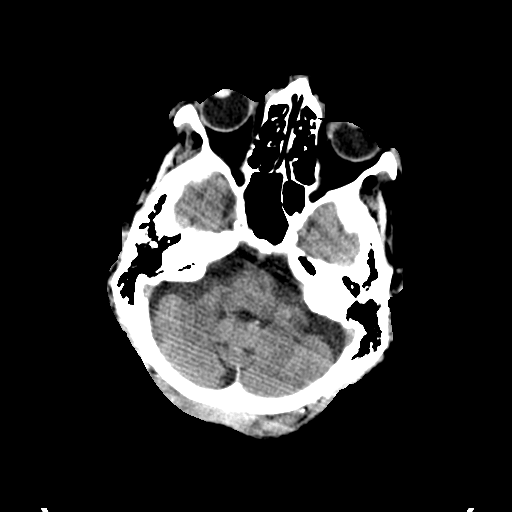
[im 9/31  brain]
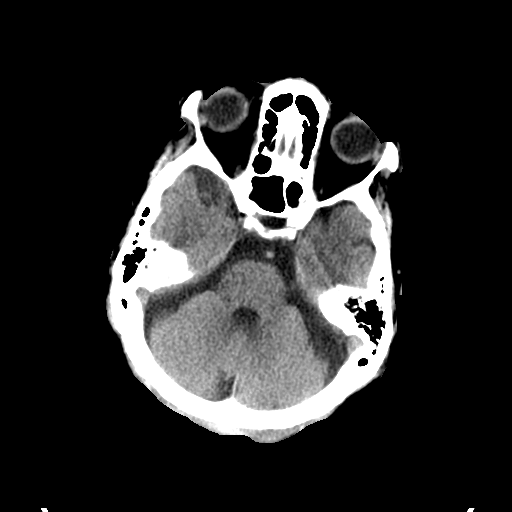
[im 13/31  brain]
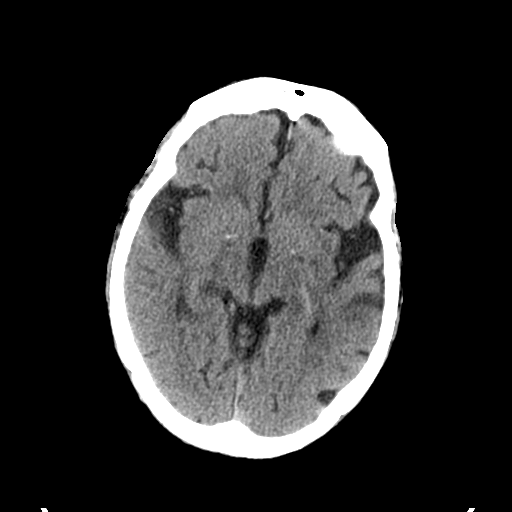
[im 16/31  brain]
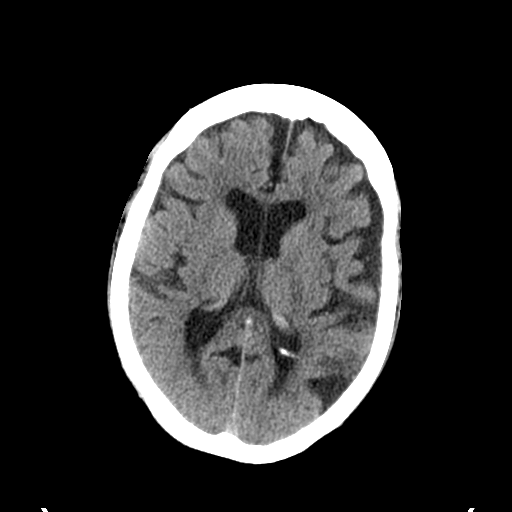
[im 16/31  bone]
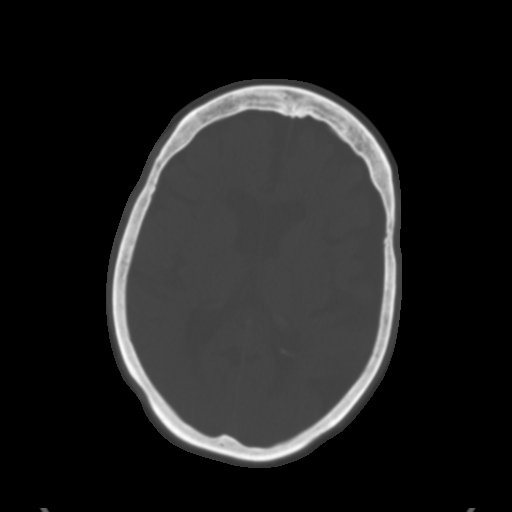
[im 18/31  brain]
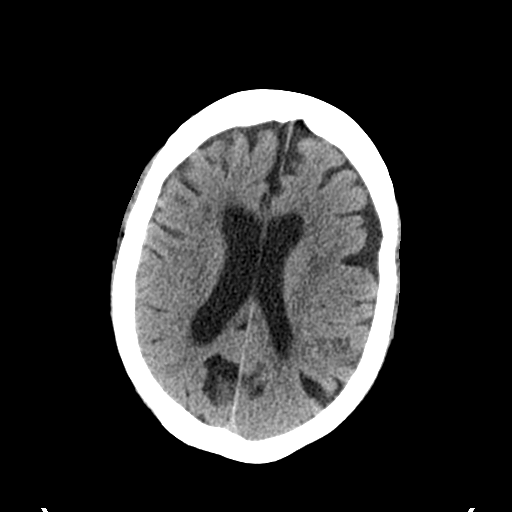
[im 22/31  brain]
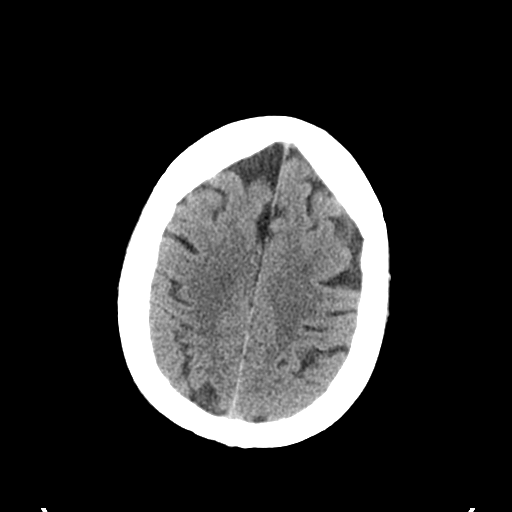
[im 24/31  brain]
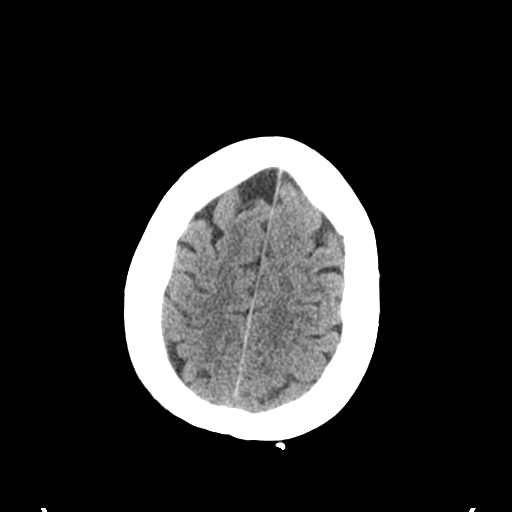
[im 28/31  brain]
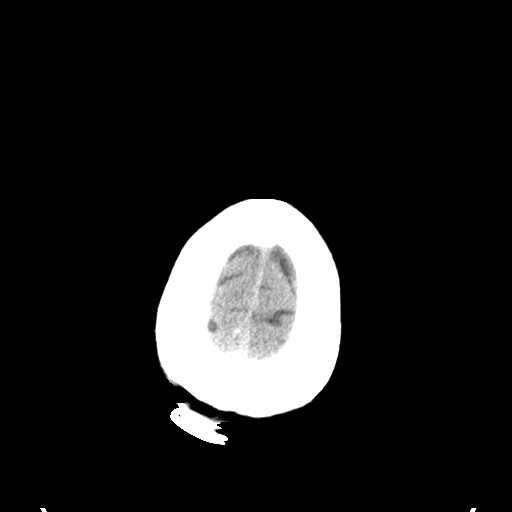
[im 28/31  bone]
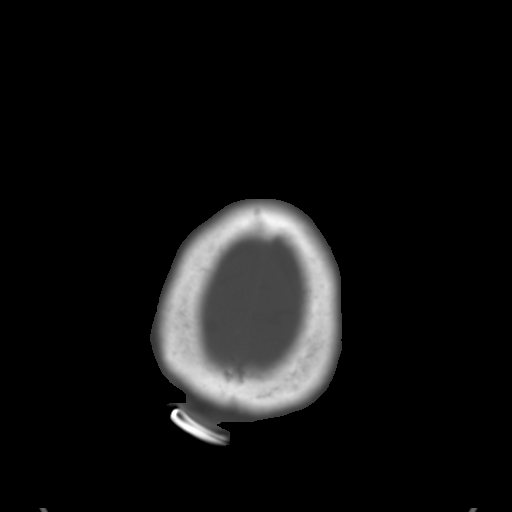

[Series 4: coronal soft tissue · coronal · 0.29mm/px · 3 of 61 slices shown]
[im 21/61  brain]
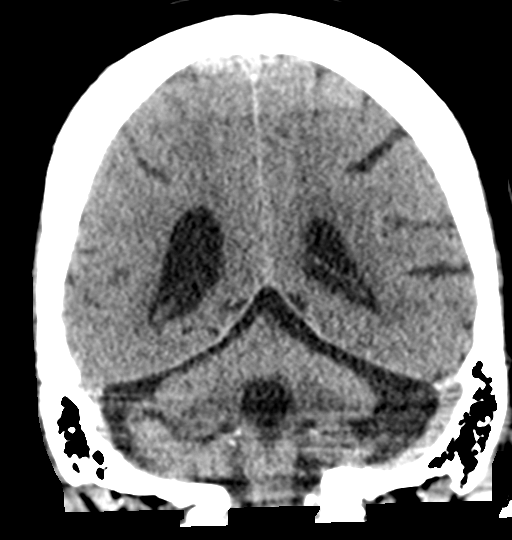
[im 27/61  brain]
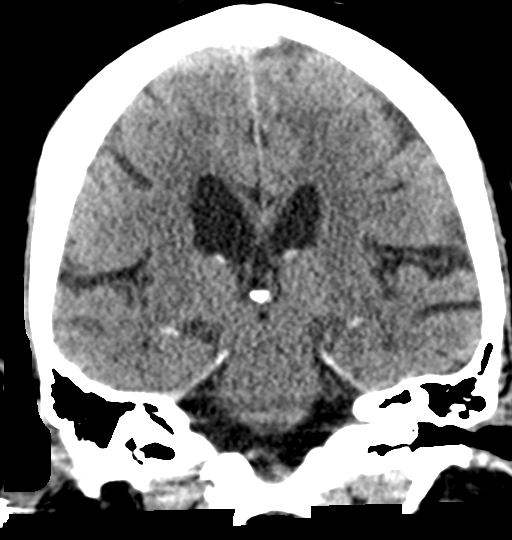
[im 34/61  brain]
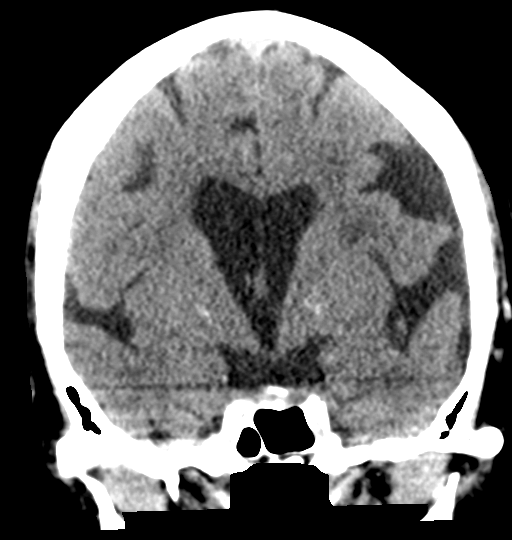

[Series 5: sagittal soft tissue · sagittal · 0.30mm/px · 3 of 47 slices shown]
[im 16/47  brain]
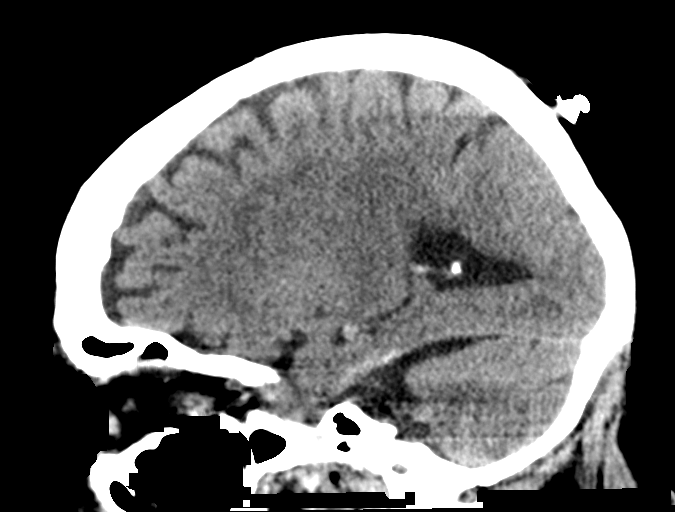
[im 24/47  brain]
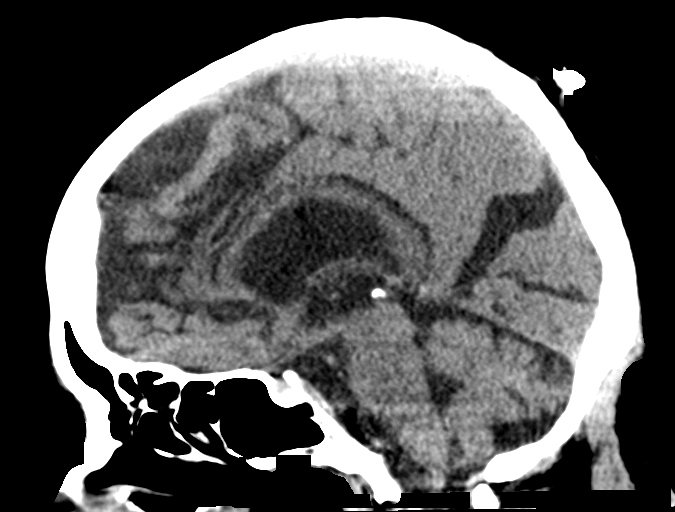
[im 31/47  brain]
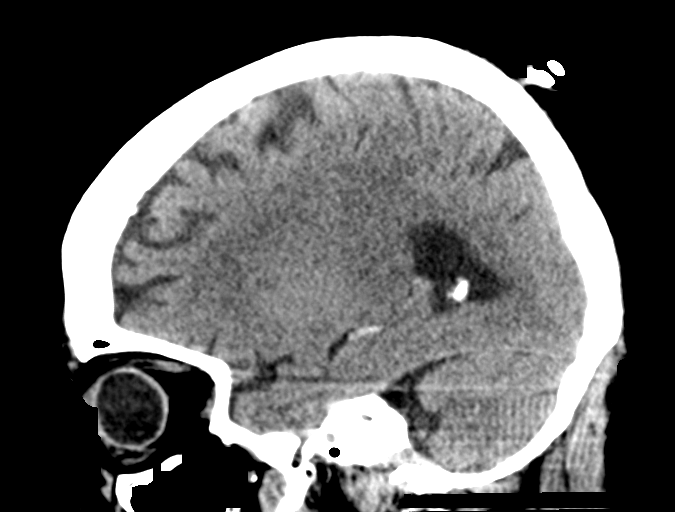

[15 of 47 positions shown; findings below may reference images not displayed]

FINDINGS: Brain: No evidence of acute infarction, hemorrhage, or mass. There
are disproportionate sulci but no ventriculomegaly to implicate
normal pressure hydrocephalus and no rounded CSF collection to
implicate a hygroma. Cerebral volume loss is mild for age, as is
small-vessel ischemic type change in the white matter.

Vascular: Atherosclerotic calcification

Skull: Negative

Sinuses/Orbits: Negative
IMPRESSION: No acute finding or change from 8723.

## 2020-07-28 ENCOUNTER — Other Ambulatory Visit: Payer: Medicare PPO

## 2020-08-03 ENCOUNTER — Other Ambulatory Visit: Payer: Self-pay

## 2020-08-03 ENCOUNTER — Ambulatory Visit (INDEPENDENT_AMBULATORY_CARE_PROVIDER_SITE_OTHER): Payer: Medicare (Managed Care) | Admitting: Podiatry

## 2020-08-03 DIAGNOSIS — B351 Tinea unguium: Secondary | ICD-10-CM

## 2020-08-03 DIAGNOSIS — M79674 Pain in right toe(s): Secondary | ICD-10-CM

## 2020-08-03 DIAGNOSIS — M79675 Pain in left toe(s): Secondary | ICD-10-CM

## 2020-08-03 DIAGNOSIS — E1042 Type 1 diabetes mellitus with diabetic polyneuropathy: Secondary | ICD-10-CM

## 2020-08-04 ENCOUNTER — Encounter: Payer: Self-pay | Admitting: Podiatry

## 2020-08-04 NOTE — Progress Notes (Signed)
  Subjective:  Patient ID: Robyn [name], female    DOB: June 12, 1934,  MRN: [id number]  Chief Complaint  Patient presents with  . Nail Problem    Nail trim     86 y.o. female she returns for follow-up of her left hallux toenail which is incurvated and causing ulceration.  She is here today with her daughter.  The wound remains healed.  She has been wearing Band-Aids on the second toes where they were rubbing against the big toe  Objective:  Physical Exam: warm, good capillary refill, reduced left and +2 DP.  No recurrence of ulceration.  She has incurvated elongated and thickened toenails.  Both second toes have areas that look like they may have had paronychia that is healed   Assessment:   1. Type 1 diabetes mellitus with diabetic polyneuropathy (HCC)   2. Pain due to onychomycosis of toenails of both feet      Plan:  Patient was evaluated and treated and all questions answered.  Patient educated on diabetes. Discussed proper diabetic foot care and discussed risks and complications of disease. Educated patient in depth on reasons to return to the office immediately should he/she discover anything concerning or new on the feet. All questions answered. Discussed proper shoes as well.   Discussed the etiology and treatment options for the condition in detail with the patient. Educated patient on the topical and oral treatment options for mycotic nails. Recommended debridement of the nails today. Sharp and mechanical debridement performed of all painful and mycotic nails today. Nails debrided in length and thickness using a nail nipper to level of comfort. Discussed treatment options including appropriate shoe gear. Follow up as needed for painful nails.  Advised her that she does not need any bandage or antibiotic ointment on the toes at this point she should let her daughter know when the nails become incurvated and start causing problems so that they do not get infected again and  she should come in for treatment.      Return in about 3 months (around 11/03/2020) for at risk diabetic foot care.

## 2020-08-05 ENCOUNTER — Encounter: Payer: Medicare HMO | Admitting: Counselor

## 2020-08-05 ENCOUNTER — Encounter: Payer: Medicare PPO | Admitting: Counselor

## 2020-08-12 ENCOUNTER — Encounter: Payer: Medicare PPO | Admitting: Counselor

## 2020-10-12 ENCOUNTER — Ambulatory Visit: Payer: Medicare HMO | Attending: Internal Medicine

## 2020-10-12 ENCOUNTER — Other Ambulatory Visit (HOSPITAL_BASED_OUTPATIENT_CLINIC_OR_DEPARTMENT_OTHER): Payer: Self-pay

## 2020-10-12 ENCOUNTER — Other Ambulatory Visit: Payer: Self-pay

## 2020-10-12 DIAGNOSIS — Z23 Encounter for immunization: Secondary | ICD-10-CM

## 2020-10-12 MED ORDER — PFIZER-BIONT COVID-19 VAC-TRIS 30 MCG/0.3ML IM SUSP
INTRAMUSCULAR | 0 refills | Status: DC
  Filled 2020-10-12: qty 0.3, 1d supply, fill #0

## 2020-10-12 NOTE — Progress Notes (Signed)
   Covid-19 Vaccination Clinic  Name:  Robyn [name]    MRN: [id number] DOB: Apr 01, 1935  10/12/2020  [name] was observed post Covid-19 immunization for 15 minutes without incident. She was provided with Vaccine Information Sheet and instruction to access the V-Safe system.   [name] was instructed to call 911 with any severe reactions post vaccine: Marland Kitchen Difficulty breathing  . Swelling of face and throat  . A fast heartbeat  . A bad rash all over body  . Dizziness and weakness   Immunizations Administered    Name Date Dose VIS Date Route   PFIZER Comrnaty(Gray TOP) Covid-19 Vaccine 10/12/2020 10:47 AM 0.3 mL 04/30/2020 Intramuscular   Manufacturer: [employer], [employer]   Lot: [id number]   NDC: [phone]

## 2020-10-28 ENCOUNTER — Encounter: Payer: Medicare HMO | Admitting: Counselor

## 2020-10-28 ENCOUNTER — Encounter: Payer: Self-pay | Admitting: Counselor

## 2020-10-28 ENCOUNTER — Other Ambulatory Visit: Payer: Self-pay

## 2020-10-28 ENCOUNTER — Ambulatory Visit: Payer: Medicare HMO | Admitting: Psychology

## 2020-10-28 ENCOUNTER — Ambulatory Visit (INDEPENDENT_AMBULATORY_CARE_PROVIDER_SITE_OTHER): Payer: Medicare HMO | Admitting: Counselor

## 2020-10-28 DIAGNOSIS — F0391 Unspecified dementia with behavioral disturbance: Secondary | ICD-10-CM

## 2020-10-28 DIAGNOSIS — R413 Other amnesia: Secondary | ICD-10-CM

## 2020-10-28 DIAGNOSIS — R441 Visual hallucinations: Secondary | ICD-10-CM

## 2020-10-28 DIAGNOSIS — F09 Unspecified mental disorder due to known physiological condition: Secondary | ICD-10-CM

## 2020-10-28 NOTE — Progress Notes (Signed)
   Psychometrist Note   Cognitive testing was administered to Robyn [name] by Milana Kidney, B.S. (Technician) under the supervision of Alphonzo Severance, Psy.D., ABN. [name] was able to tolerate all test procedures. Dr. Nicole Kindred met with the patient as needed to manage any emotional reactions to the testing procedures. Rest breaks were offered.    The battery of tests administered was selected by Dr. Nicole Kindred with consideration to the patient's current level of functioning, the nature of her symptoms, emotional and behavioral responses during the interview, level of literacy, observed level of motivation/effort, and the nature of the referral question. This battery was communicated to the psychometrist. Communication between Dr. Nicole Kindred and the psychometrist was ongoing throughout the evaluation and Dr. Nicole Kindred was immediately accessible at all times. Dr. Nicole Kindred provided supervision to the technician on the date of this service, to the extent necessary to assure the quality of all services provided.    [name] will return in approximately one week for an interactive feedback session with Dr. Nicole Kindred, at which time test performance, clinical impressions, and treatment recommendations will be reviewed in detail. The patient understands she can contact our office should she require our assistance before this time.   A total of 90 minutes of billable time were spent with Robyn [name] by the technician, including test administration and scoring time. Billing for these services is reflected in Dr. Les Pou note.   This note reflects time spent with the psychometrician and does not include test scores, clinical history, or any interpretations made by Dr. Nicole Kindred. The full report will follow in a separate note.

## 2020-10-28 NOTE — Progress Notes (Signed)
NEUROPSYCHOLOGICAL EVALUATION [facility]  Patient Name: Robyn [name]: [id number] Date of Birth: 1934/05/31 Age: 86 y.o. Education: 9 years  Referral Circumstances and Background Information  Robyn [name] is a 86 y.o., right-hand dominant, widowed woman with a history of dementia, psychosis, and concern for Lewy Body. There are indications that the patient has experienced formed visual hallucinations since at least 2016 (see 10/08/2014 note from Cyndie Mull). Robyn [name] recently consulted with Dr. Karel Jarvis who did note some cogwheeling with distraction and resting tremor in the right index figure.  The patient presented with her daughter Robyn [name] who helped provide collateral history and status information. It sounds as though the patient's changes started in 2014. Robyn [name] and her daughter were somewhat confusing as historians although it sounds like Robyn [name] was probably having formed visual hallucinations and auditory hallucinations at that time. In Robyn [name]' note from 2016, it states that the patient was upset because Robyn [name] thought two sisters had entered her house and also that Robyn [name] thought Robyn [name] could see one of them at the ED. Robyn [name] had called the police multiple times about this and there had never been someone found in the house. Over time, the frequency and intensity of her hallucinations has increased to the point that Robyn [name] sees people most of the time during the day. Robyn [name] will feed her hallucinations, Robyn [name] will ask what they are doing in her house, and sometimes Robyn [name] will ask them to leave. Robyn [name] was eating in the kitchen for a while because "they" told her not to eat at the table. Robyn [name] stated that usually, they are annoying although sometimes Robyn [name] does feel threatened by them. Nevertheless, Robyn [name] does not try to fight them and there are no safety concerns.   With respect to cognitive changes, the patient repeats herself and forgets things but it doesn't sound as though Robyn [name] has rapid forgetting of  information. Her daughter stated that there are times when Robyn [name] goes "in a trance," like Robyn [name] is far away, which may represent fluctuations. They stated this lasts about a minute. They denied any problems with language. They denied any major personality changes although Robyn [name] does get irritated more quickly than in the past. Robyn [name] has no clear praxis problems and they denied problems writing, tying her shoes, or with using appliances. The patient reported that her energy is low, Robyn [name] is often tired because Robyn [name] doesn't sleep well. The patient stated that her mood is "ok" but said so hesitantly. It sounds like Robyn [name] often perceives her daughter's attempts to help as controlling. Robyn [name] stated that Robyn [name] doesn't sleep at all, maybe 30 minutes at a time. Her daughter said that Robyn [name] does look tired a lot of the time but they aren't sure how much sleep Robyn [name] is getting. They aren't sure if there is dream enactment behavior, no one observes her when Robyn [name] sleeps. Robyn [name] denied ever having woken up on the floor or with the covers off the bed. With respect to movement symptoms, the patient seems to stagger when Robyn [name] walks and Robyn [name] takes small steps. They denied any shuffling. Robyn [name] is not having falls. They are saying Robyn [name] will get a tremor "every once in a while." They do not notice changes in facial expressiveness.   The patient is still living on her own, Robyn [name] lives in a one bedroom apartment and has been there for 11 years. The patient's daughter has always managed a number of things for her mother, since Robyn [name] moved here after the passing of  her husband about 11 years ago, not because of cognitive impairment. They initially reported that the patient manages all her own finances, which is somewhat concerning. They then contradicted that and said that Robyn [name] simply provides her daughter with money so that Robyn [name] can pay the bills. It sounds like Robyn [name] pays a limited number of bills over the phone and her daughter takes care of the rest. Robyn [name] does not drive  and has never driven. Robyn [name] can't go out into the community much, because Robyn [name] has limited mobility and does not drive. Her daughter will get her groceries and things of that nature. Her daughter also manages her medical appointments. The patient said that Robyn [name] can cook all kinds of things but her daughter said Robyn [name] cooks infrequently and orders out a lot. They do need to prompt her to eat. They are reporting that Robyn [name] manages her own medications, including her insulin, and they said that Robyn [name] is reliable and that they do check. Robyn [name] does need prompting to take showers occasionally but Robyn [name] is independent with doing so. They denied any incontinence. Her family come to clean her house, shop for her, and also do her laundry. Her daughter is trying to work on living circumstances but did not present as though Robyn [name] wanted to talk about that in depth in front of the patient.   Past Medical History and Review of Relevant Studies   Patient Active Problem List   Diagnosis Date Noted  . General psychoses (HCC) 12/02/2014  . Psychosis (HCC) 12/02/2014  . Mild cognitive impairment 12/02/2014  . Type 1 diabetes mellitus (HCC) 07/16/2008  . HYPERLIPIDEMIA 07/16/2008  . Anxiety state 07/16/2008  . PHOBIA 07/16/2008  . GLAUCOMA 07/16/2008  . HYPERTENSION 07/16/2008  . CORONARY ARTERY DISEASE, S/P PTCA 07/16/2008  . ALLERGIC RHINITIS, SEASONAL 07/16/2008  . OSTEOARTHRITIS 07/16/2008  . HALLUCINATION 07/16/2008  . Insomnia 07/16/2008  . WEIGHT LOSS, ABNORMAL 07/16/2008  . DYSPNEA 07/16/2008  . PERSONAL HX TIA & CI W/O RESIDUAL DEFICITS 07/16/2008   Review of Neuroimaging and Relevant Medical History: They were vague about strokes. The patient said Robyn [name] had a stroke several weeks ago, but it was not medically diagnosed, it was "one of those feelings on the left side of my head." Robyn [name] did apparently have a stroke of some sort when Robyn [name] had a stent placed in 2009, it was during the procedure. They denied any history of  seizures or head injuries with loss of cosciousness.   The patient's most recent neuroimaging is a CT head from 06/25/2019 that shows patchy displacement in the sulci ("disproportionate sulci" noted by radiology). This resembles entrapped sulci as in hydrocephalus but Robyn [name] has no other features to suggest that. Could also potentially represent patchy volume loss. They do not have the appearance of hygromas or arachnoid cysts/granulations. Volume loss is difficult to assess given those features but appears otherwise mild. Minimal hypodensity compatible with small vessel disease. Overall the imaging is nonspecific.   Current Outpatient Medications  Medication Sig Dispense Refill  . amLODipine (NORVASC) 10 MG tablet Take 10 mg by mouth daily.    Marland Kitchen atorvastatin (LIPITOR) 20 MG tablet Take 20 mg by mouth daily.  (Patient not taking: Reported on 06/05/2020)    . cephALEXin (KEFLEX) 500 MG capsule  (Patient not taking: Reported on 06/05/2020)    . ciclopirox (PENLAC) 8 % solution Apply topically at bedtime. Apply over nail and surrounding skin. Apply daily over previous coat. After seven (7) days, may remove with alcohol and  continue cycle. (Patient not taking: Reported on 06/05/2020) 6.6 mL 2  . COVID-19 mRNA Vac-TriS, Pfizer, (PFIZER-BIONT COVID-19 VAC-TRIS) SUSP injection Inject into the muscle. 0.3 mL 0  . insulin aspart protamine-insulin aspart (NOVOLOG 70/30) (70-30) 100 UNIT/ML injection Inject 25 Units into the skin daily with breakfast. Has in the morning according to blood sugars.    . meloxicam (MOBIC) 7.5 MG tablet meloxicam 7.5 mg tablet (Patient not taking: Reported on 06/05/2020)    . mupirocin ointment (BACTROBAN) 2 % Apply 1 application topically 2 (two) times daily. (Patient not taking: Reported on 06/05/2020) 30 g 2  . NOVOLIN 70/30 RELION (70-30) 100 UNIT/ML injection     . risperiDONE (RISPERDAL) 0.25 MG tablet Take 1 tablet every night for 1 week, then increase to 1 tablet twice a day 60  tablet 11   No current facility-administered medications for this visit.    Family History  Problem Relation Age of Onset  . Hypertension Mother   . Diabetes Mother   . Hypertension Father   . Diabetes Father    There is no  family history of dementia. There is a family history of psychiatric illness. The patient's daughter reported that the patient's oldest sister was in the hospital for mental health issues but they didn't recall her diagnosis.   Psychosocial History  Developmental, Educational and Employment History: The patient reported that Robyn [name] quit school in the 10th grade, leading me to quesiton if Robyn [name] wasn't the most engaged student. Robyn [name] said that Robyn [name] wasn't sure why Robyn [name] quit school and Robyn [name] vehemently denied that Robyn [name] had any learning problems, that Robyn [name] was ever held back or the like. Robyn [name] said that her grades were "pretty good."   Psychiatric History: The patient was previously getting therapy, because they thought her issues were psychological in nature. That stopped a while ago. Robyn [name] was reportedly seeing a psychologist. Robyn [name] is not currently seeing a psychiatrist or psychologist. Robyn [name] denied any history of inpatient mental health treatment, suicide, or self-harm.   Substance Use History: The patient doesn't smoke, Robyn [name] doesn't drink, Robyn [name] doesn't use illicit substances.   Relationship History and Living Cimcumstances: The patient was previously married for about 25 years. Robyn [name] is widowed, he passed in 2003. Robyn [name] has two daughters and a son, her daughter Robyn [name] is the most involved in her care.   Mental Status and Behavioral Observations  Sensorium/Arousal: The patient's level of arousal was awake and alert. Hearing and vision were adequate for testing purposes, although Robyn [name] did ask for repetition. Orientation: The patient was fully oriented.  Appearance: Dressed in appropriate, casual clothing Behavior: The patient was pleasant and appropriate. Robyn [name] had a very, very slow tempo for testing  even when Robyn [name] was told to speed up. Robyn [name] was also somewhat hypokinetic Speech/language: Spoke softly (hypomimia?) no word finding pauses or paraphasias.  Gait/Posture: Gait was with small steps, Robyn [name] was holding her back due to pain so I didn't get to observe arm swing or natural gait well Movement: Patient did seem to have a yes direction head tremor that emerged when doing other graphomotor testing. Otherwise I saw no rest tremor.  Social Comportment: Appropriate, pleasant Mood: "Ok" Affect: Hypomimia, did have some laughing as appropriate during the appointment Thought process/content: Difficult to assess given limited speech output, Robyn [name] was able to respond to questions and provide some history.  Safety: No safety concerns identified at today's visit Insight: Questionable   MMSE - Mini Mental State Exam 10/28/2020 06/05/2020  Orientation to time 5  5  Orientation to Place 5 2  Registration 3 0  Attention/ Calculation 0 0  Recall 1 0  Language- name 2 objects 2 2  Language- repeat 1 1  Language- follow 3 step command 3 3  Language- read & follow direction 1 1  Write a sentence 1 1  Copy design 1 0  Total score 23 15   Test Procedures  Wide Range Achievement Test - 4             Word Reading Neuropsychological Assessment Battery  List Learning  Naming  Digit Span Repeatable Battery for the Assessment of Neuropsychological Status (Form A)  Figure Copy  Judgment of Line Orientation  Coding  Figure Recall Controlled Oral Word Association (F-A-S) Semantic Fluency (Animals) Trail Making Test A & B Complex Ideational Material Clock Drawing Geriatric Depression Scale - Short Form Quick Dementia Rating System (completed by daughter, Robyn [name])  Plan  Robyn [name] was seen for a psychiatric diagnostic evaluation and neuropsychological testing. Robyn [name] is a pleasant, 86 year old, right-hand dominant woman with a history of formed visual hallucinations and accompanying delusions since  2014 or so. Robyn [name] and her daughter were not detailed historians regarding cognitive problems although they continue to notice ongoing hallucinations, getting confused about things, and also what sounds like fluctuations (staring off into space). 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 Karel JarvisAquino with Cogwheeling and rest tremor in her right index finger. Robyn [name] had a yes direction head tremor today, although that is often associated with ET as opposed to Parkinsonian condition and was not noted at her neurology appointment. Full and complete note with impressions, recommendations, and interpretation of test data to follow.   Robyn BoeckPeter V. Roseanne RenoStewart, PsyD, ABN Clinical Neuropsychologist  Informed Consent  Risks and benefits of the evaluation were discussed with the patient prior to all testing procedures. I conducted a clinical interview and neuropsychological testing (at least two tests) with Chapman Fitchhelma Robyn Hodapp and Wallace Kellerana Chamberlain, B.S. (Technician) administered additional test procedures. The patient was able to tolerate the testing procedures and the patient (and/or family if applicable) is likely to benefit from further follow up to receive the diagnosis and treatment recommendations, which will be rendered at the next encounter.

## 2020-10-29 NOTE — Progress Notes (Signed)
NEUROPSYCHOLOGICAL TEST SCORES [facility]  Patient Name: Robyn [name] MRN: [id number] Date of Birth: July 16, 1934 Age: 86 y.o. Education: 9 years  Measurement properties of test scores: IQ, Index, and Standard Scores (SS): Mean = 100; Standard Deviation = 15 Scaled Scores (Ss): Mean = 10; Standard Deviation = 3 Z scores (Z): Mean = 0; Standard Deviation = 1 T scores (T); Mean = 50; Standard Deviation = 10  TEST SCORES:    Note: This summary of test scores accompanies the interpretive report and should not be interpreted by unqualified individuals or in isolation without reference to the report. Test scores are relative to age, gender, and educational history as available and appropriate.   Mental Status Screening     Total Score Descriptor  MMSE 23 Mild Dementia      Expected Functioning        Wide Range Achievement Test: Standard/Scaled Score Percentile      Word Reading 99 47      Attention/Processing Speed        Neuropsychological Assessment Battery (Attention Module, Form 1): T-score Percentile      Digits Forward 49 46      Digits Backwards 50 50      Repeatable Battery for the Assessment of Neuropsychological Status (Form A): Scaled Score Percentile      Coding 1 <1      Language        Neuropsychological Assessment Battery (Language Module, Form 1): T-score Percentile      Naming   (21) 28 2      Verbal Fluency: T-score Percentile      Controlled Oral Word Association (F-A-S) 31 3      Semantic Fluency (Animals) 43 25      Memory:        Neuropsychological Assessment Battery (Memory Module, Form 1): T-score Percentile      List Learning           List A Immediate Recall   (0, 3, 6) 25 1         List B Immediate Recall   (0) 30 2         List A Short Delayed Recall   (4) 44 27         List A Long Delayed Recall   (4) 45 31         List A Percent Retention   (100 %) --- 62         List A Long Delayed Yes/No Recognition Hits   (9) --- 14          List A Long Delayed Yes/No Recognition False Alarms   (10) --- 12         List A Recognition Discriminability Index --- 4      Repeatable Battery for the Assessment of Neuropsychological Status (Form A): Scaled Score Percentile         Figure Recall   (6) 6 9      Visuospatial/Constructional Functioning        Repeatable Battery for the Assessment of Neuropsychological Status (Form A): Standard/Scaled Score Percentile     Visuospatial/Constructional Index 78 7         Figure Copy   (15) 6 9         Judgment of Line Orientation   (13) --- 10-16      Executive Functioning        Trail Making Test: T-Score Percentile  Part A 32 4      Part B 35 7      [place] Diagnostic Aphasia Exam: Raw Score Scaled Score      Complex Ideational Material 7 2      Clock Drawing Raw Score Descriptor      Command 6 Mild Impairment      Rating Scales        Clinical Dementia Rating Raw Score Descriptor      Sum of Boxes 6.5 Mild Dementia      Global Score 1.0 Mild Dementia      Quick Dementia Rating System Raw Score Descriptor      Sum of Boxes 10 Moderate Dementia      Total Score 15 Moderate Dementia  Geriatric Depression Scale - Short Form 2 Negative    Lamaj Metoyer V. Roseanne Reno PsyD, ABN Clinical Neuropsychologist

## 2020-10-30 NOTE — Progress Notes (Signed)
NEUROPSYCHOLOGICAL EVALUATION [facility]  Patient Name: Robyn [name] MRN: [id number] Date of Birth: 08/31/1934 Age: 86 y.o. Education: 16 years  Clinical Impressions  Robyn [name] is a 86 y.o., right-hand dominant, widowed with a history of formed visual hallucinations since at least 2016 and probably 2014. At first, these were once in a while and now they are nearly every day. Her behavior is substantially influenced by these delusions/hallucinations and she was previously calling the police to get them out of the house. The patient and her daughter were somewhat difficult historians regarding her cognitive symptoms but her daughter does notice that she will stare off for brief periods of time, as though she is "in a trance" and she can be forgetful. Her MRI shows disproportionate, odd shaped sulci with an almost entrapped appearance although there is no ventriculomegaly to suggest that this is the result of hydrocephalus. She has mild volume loss and mild areas of hypodensity suggestive of leukoaraiosis otherwise.   On neurospychological testing, Robyn [name] demonstrated impaired performance on measures of processing speed, executive function, and her visuospatial and constructional functioning fell at an unusually low level, suggesting some mild difficulties. She also had marked naming problems and diminished phonemic as compared to semantic fluency. Memory performance was notable for low encoding of information but she retained verbal information well and does not appear to have a frank storage problem at this time. She did well on measures of attention/working memory. Her CDR places her in the mild dementia range based on the history of provided and her objective test performance, although her daughter characterized her as functioning at a moderate dementia level. It is possible that movement or other issues are confounding her rating or perhaps the patient simply tests  well. She did score a 4/4 on the Mayo Fluctuations Questionnaire.  Robyn [name] a dementia level problem with primary deficits on measures of processing speed, executive functioning, and naming and she has some additional visuospatial/constructional issues. 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 Her test data are weakly supportive although her visuospatial functioning is less affected than might be expected. A mixed pathological picture is also possible, although her test data are equivocal for AD (has naming problems but no memory storage problem).   Diagnostic Impressions: Lewy Body Dementia with behavioral disturbance  Recommendations to be discussed with patient  Your performance and presentation on assessment today were consistent with impaired performance on measures of processing speed, executive function, naming (I.e., word retrieval) and you also had some more minor difficulties on measures of visuospatial and constructional functioning. You had problems with learning information but did not have any difficulty retaining information across time, which is good. I think that these findings, in the context of your clinical history, support a diagnosis of dementia.   Dementia refers to a group of syndromes where multiple areas of ability are damaged in the brain, such as memory, thinking, judgment, and behavior, and most commonly refers to age related causes of dementia that cause worsening in these abilities over time. Alzheimer's disease is the most common form of dementia in people over the age of 81. Not all dementias are Alzheimer's disease, but all Alzheimer's disease is dementia. When dementia is due to an underlying condition affecting the brain, such as Alzheimer's disease, there is progression over time, which typically proceeds gradually over  many years.   In your  case, you meet criteria for probably Lewy Body Dementia. This is a dementia condition in the Parkinson's disease spectrum, that can often involve movement changes such as we see in Parkinsons disease (e.g., shuffling, tremor, rigidity, falls). This dementia also frequently involves formed visual hallucinations, which you certainly have. This is a progressive, degenerative condition, which means that there will be progression over time.  At present, I would put your severity level in the mild stage, although your daughter rated your functioning as somewhat worse. These stages are not so much discrete stages as they are points on a continuum of severity. These stages govern the types of behavior you can expect, the level of care someone needs, and necessary supports. In the mild stages, difficulties are typically limited to cognitive problems, such as forgetfulness or difficulties coming up with the right word. Individuals are often able to remain at home, safely operate a motor vehicle, and changes may not be obvious to the casual observer. They may have difficulties with more complex things such as managing finances, with driving, and with work if they are still working. These individuals are often capable of living independently with minimal assistance, but do require some supervision with more complex tasks such as managing finances, arranging and remembering to take medications, and reporting symptoms at doctors appointments.   The mainstay of treatment for individuals with dementia is assistance as needed with activities of daily living. This usually includes complex things such as managing finances in the early stages. As the disease progresses, people may need increasing assistance with things such as venturing out into the community to get food or other necessary items, with meal planning, keeping of personal effects and the like. Individuals with dementia typically need assistance  with any memory dependent activities such as remembering to take medications, keeping track of doctors appointments, and other social engagements. The more these things can be built into routines that are simple, predictable and easy to follow, the better. Activities with a high risk of adverse outcomes (e.g., driving) need to be carefully monitored, because at some point in the disease progression, it becomes unsafe to drive.   Instead of medications, I recommend behavioral strategies for dealing with the behavioral and psychological issues that can accompany dementia. Things like agitation, wandering, and anxiety can often be improved or eliminated using the "three R's." Redirection (help distract your loved one by focusing their attention on something else, moving them to a new environment, or otherwise engaging them in something other than what is distressing to them), Reassurance (reassure them that you are there to take care of them and that there is nothing they need to be worried about), and Reconsidering (consider the situation from their perspective and try to identify if there is something about the situation or environment that may be triggering their reaction).  Oftentimes common sense, patience, and knowledge are the best tools for managing problematic behaviors in dementia. Every individual responds differently, but there are some general recommendations that may be helpful.  Provide assistance as needed with memory-dependent or other cognitively challenging activities (e.g., doctors appointments, medications, planning trips, complex chores, paperwork or other forms).  Use simple, clear words that are easy to understand. Simplify information, be clear and concise.  Provide reassurance rather than trying to reason or give explanations.  Suggest the word that the person may be looking for.  Help simplify steps of activities in daily life, such as writing down steps of a task. Eliminate tasks  that are  difficult and frustrating.  Give instructions one step at a time.  Consistency, praise, and encouragement are very important; do not use criticism or punishment.  Agitated or catastrophic reactions are often a reaction to something in the environment or task difficulty/failure; know functional limits; when noticing the person getting upset, help redirect them away from the task and/or move to a different environment.    You can return for reevaluation in 1 to 2 years as needed or clinically indicated. If you do not notice changes and the evaluation is not requested by a provider for a specific purpose, then you do not need to come back.   Test Findings  Test scores are summarized in additional documentation associated with this encounter. Test scores are relative to age, gender, and educational history as available and appropriate. There were no concerns about performance validity as all findings fell within normal expectations.   General Intellectual Functioning/Achievement:  Performance on single word reading was squarely within the average range.   Attention and Processing Efficiency: Indicators of attention and working memory showed average abilities. By contrast, her performance on timed indicators of processing speed was low. Timed number-symbol coding was extremely low and simple numeric sequencing was extremely low.  Language: Performance on language measures was notable for very impaired visual object confrontation naming. She had unusually low performance on generation of words in response to the letters F-A-S and generation of animals in one minute was low average.   Visuospatial Function: Performance was unusually low on visuospatial and constructional indicators. Figure copy was unusually low. Judgment of angular line orientations was weak, at the margin of the unusually low and low average range.   Learning and Memory: Performance on indicators of learning and memory was  suggestive of impaired learning yet good retention of information across time. This was particularly true for verbal information, where retention was 100%.   In the verbal realm, Robyn [name] learned 0, 3, and 6 words of a 12-item word list across three trials. She retained 4 of those words on short delayed recall, which is average. Long delayed recall was also average. Performance on recognition discriminability was not robust, with more false-positives than correct identifications.   In the verbal realm, Robyn [name] demonstrated unusually low figure copy.   Executive Functions: Performance on executive indicators was suggestive of impairment with low scores on nearly all measures. Generation of words in response to the letters F-A-S was unusually low. Alternating sequencing of numbers and letters of the alphabet was disctoninued at the time limit, generating an unusually low score. Reasoning with verbal information was extremely low on the Complex Ideational Material. Clock drawing was suggestive of "Moderate impairment" with missing numbers, added numbers, and somewhat weak hand placement.   Rating Scale(s): Robyn [name] was characterized as functioning at a moderate dementia level by her daughter. This is much more significant than her presentation of the patient's level of difficulty in the clinical interview, given that she reports she is still managing her medications and a number of her finances. I was able to rate a CDR for her and her sum of boxes and global score both fall in the mild dementia range.   Robyn Boeck Roseanne Reno, PsyD, ABN Clinical Neuropsychologist  Coding and Compliance  Billing below reflects technician time, my direct face-to-face time with the patient, time spent in test administration, and time spent in professional activities including but not limited to: neuropsychological test interpretation, integration of neuropsychological test data with clinical history, report preparation,  treatment planning, care coordination, and review of diagnostically pertinent medical history or studies.   Services associated with this encounter: Clinical Interview [phone]) plus 120 minutes (96132/96133; Neuropsychological Evaluation by Professional)  23 minutes (96136/96137; Test Administration by Professional) 90 minutes (96138/96139; Neuropsychological Testing by Technician)

## 2020-11-05 ENCOUNTER — Ambulatory Visit (INDEPENDENT_AMBULATORY_CARE_PROVIDER_SITE_OTHER): Payer: Medicare HMO | Admitting: Counselor

## 2020-11-05 ENCOUNTER — Encounter: Payer: Medicare HMO | Admitting: Counselor

## 2020-11-05 ENCOUNTER — Other Ambulatory Visit: Payer: Self-pay

## 2020-11-05 ENCOUNTER — Encounter: Payer: Self-pay | Admitting: Counselor

## 2020-11-05 DIAGNOSIS — F0281 Dementia in other diseases classified elsewhere with behavioral disturbance: Secondary | ICD-10-CM

## 2020-11-05 DIAGNOSIS — G3183 Dementia with Lewy bodies: Secondary | ICD-10-CM | POA: Diagnosis not present

## 2020-11-05 NOTE — Patient Instructions (Signed)
Your performance and presentation on assessment today were consistent with impaired performance on measures of processing speed, executive function, naming (I.e., word retrieval) and you also had some more minor difficulties on measures of visuospatial and constructional functioning. You had problems with learning information but did not have any difficulty retaining information across time, which is good. I think that these findings, in the context of your clinical history, support a diagnosis of dementia.   Dementia refers to a group of syndromes where multiple areas of ability are damaged in the brain, such as memory, thinking, judgment, and behavior, and most commonly refers to age related causes of dementia that cause worsening in these abilities over time. Alzheimer's disease is the most common form of dementia in people over the age of 75. Not all dementias are Alzheimer's disease, but all Alzheimer's disease is dementia. When dementia is due to an underlying condition affecting the brain, such as Alzheimer's disease, there is progression over time, which typically proceeds gradually over many years.   In your case, you meet criteria for probably Lewy Body Dementia. This is a dementia condition in the Parkinson's disease spectrum, that can often involve movement changes such as we see in Parkinsons disease (e.g., shuffling, tremor, rigidity, falls). This dementia also frequently involves formed visual hallucinations, which you certainly have. This is a progressive, degenerative condition, which means that there will be progression over time.   At present, I would put your severity level in the mild stage, although your daughter rated your functioning as somewhat worse. These stages are not so much discrete stages as they are points on a continuum of severity. These stages govern the types of behavior you can expect, the level of care someone needs, and necessary supports. In the mild stages,  difficulties are typically limited to cognitive problems, such as forgetfulness or difficulties coming up with the right word. Individuals are often able to remain at home, safely operate a motor vehicle, and changes may not be obvious to the casual observer. They may have difficulties with more complex things such as managing finances, with driving, and with work if they are still working. These individuals are often capable of living independently with minimal assistance, but do require some supervision with more complex tasks such as managing finances, arranging and remembering to take medications, and reporting symptoms at doctors appointments.   The mainstay of treatment for individuals with dementia is assistance as needed with activities of daily living. This usually includes complex things such as managing finances in the early stages. As the disease progresses, people may need increasing assistance with things such as venturing out into the community to get food or other necessary items, with meal planning, keeping of personal effects and the like. Individuals with dementia typically need assistance with any memory dependent activities such as remembering to take medications, keeping track of doctors appointments, and other social engagements. The more these things can be built into routines that are simple, predictable and easy to follow, the better. Activities with a high risk of adverse outcomes (e.g., driving) need to be carefully monitored, because at some point in the disease progression, it becomes unsafe to drive.   Instead of medications, I recommend behavioral strategies for dealing with the behavioral and psychological issues that can accompany dementia. Things like agitation, wandering, and anxiety can often be improved or eliminated using the "three R's." Redirection (help distract your loved one by focusing their attention on something else, moving them to a new environment,  or otherwise  engaging them in something other than what is distressing to them), Reassurance (reassure them that you are there to take care of them and that there is nothing they need to be worried about), and Reconsidering (consider the situation from their perspective and try to identify if there is something about the situation or environment that may be triggering their reaction).   Oftentimes common sense, patience, and knowledge are the best tools for managing problematic behaviors in dementia. Every individual responds differently, but there are some general recommendations that may be helpful. Provide assistance as needed with memory-dependent or other cognitively challenging activities (e.g., doctors appointments, medications, planning trips, complex chores, paperwork or other forms). Use simple, clear words that are easy to understand. Simplify information, be clear and concise. Provide reassurance rather than trying to reason or give explanations. Suggest the word that the person may be looking for. Help simplify steps of activities in daily life, such as writing down steps of a task. Eliminate tasks that are difficult and frustrating. Give instructions one step at a time. Consistency, praise, and encouragement are very important; do not use criticism or punishment. Agitated or catastrophic reactions are often a reaction to something in the environment or task difficulty/failure; know functional limits; when noticing the person getting upset, help redirect them away from the task and/or move to a different environment.    You can return for reevaluation in 1 to 2 years as needed or clinically indicated. If you do not notice changes and the evaluation is not requested by a provider for a specific purpose, then you do not need to come back.

## 2020-11-05 NOTE — Progress Notes (Signed)
NEUROPSYCHOLOGY FEEDBACK NOTE [facility]  Feedback Note: I met with Robyn [name] to review the findings resulting from her neuropsychological evaluation. Since the last appointment, she has been the same. She continues to have hallucinations, they have not noticed any decrease in frequency with the Risperdal. Time was spent reviewing the impressions and recommendations that are detailed in the evaluation report. We discussed impression of Lewy Body Dementia, ways to manage dementia behaviors, and behavioral strategies. I would recommend that if at all medically possible, behavioral interventions be prioritized and cognitively impairing medications such as antipsychotics be minimized. Other topics of discussion as reflected in the patient instructions. I took time to explain the findings and answer all the patient's questions. I encouraged Robyn [name] to contact me should she have any further questions or if further follow up is desired.   Current Medications and Medical History   Current Outpatient Medications  Medication Sig Dispense Refill   amLODipine (NORVASC) 10 MG tablet Take 10 mg by mouth daily.     atorvastatin (LIPITOR) 20 MG tablet Take 20 mg by mouth daily.  (Patient not taking: Reported on 06/05/2020)     cephALEXin (KEFLEX) 500 MG capsule  (Patient not taking: Reported on 06/05/2020)     ciclopirox (PENLAC) 8 % solution Apply topically at bedtime. Apply over nail and surrounding skin. Apply daily over previous coat. After seven (7) days, may remove with alcohol and continue cycle. (Patient not taking: Reported on 06/05/2020) 6.6 mL 2   COVID-19 mRNA Vac-TriS, Pfizer, (PFIZER-BIONT COVID-19 VAC-TRIS) SUSP injection Inject into the muscle. 0.3 mL 0   insulin aspart protamine-insulin aspart (NOVOLOG 70/30) (70-30) 100 UNIT/ML injection Inject 25 Units into the skin daily with breakfast. Has in the morning according to blood sugars.     meloxicam (MOBIC) 7.5 MG tablet  meloxicam 7.5 mg tablet (Patient not taking: Reported on 06/05/2020)     mupirocin ointment (BACTROBAN) 2 % Apply 1 application topically 2 (two) times daily. (Patient not taking: Reported on 06/05/2020) 30 g 2   NOVOLIN 70/30 RELION (70-30) 100 UNIT/ML injection      risperiDONE (RISPERDAL) 0.25 MG tablet Take 1 tablet every night for 1 week, then increase to 1 tablet twice a day 60 tablet 11   No current facility-administered medications for this visit.    Patient Active Problem List   Diagnosis Date Noted   General psychoses ([place]) 12/02/2014   Psychosis ([place]) 12/02/2014   Mild cognitive impairment 12/02/2014   Type 1 diabetes mellitus ([place]) 07/16/2008   HYPERLIPIDEMIA 07/16/2008   Anxiety state 07/16/2008   PHOBIA 07/16/2008   GLAUCOMA 07/16/2008   HYPERTENSION 07/16/2008   CORONARY ARTERY DISEASE, S/P PTCA 07/16/2008   ALLERGIC RHINITIS, SEASONAL 07/16/2008   OSTEOARTHRITIS 07/16/2008   HALLUCINATION 07/16/2008   Insomnia 07/16/2008   WEIGHT LOSS, ABNORMAL 07/16/2008   DYSPNEA 07/16/2008   PERSONAL HX TIA & CI W/O RESIDUAL DEFICITS 07/16/2008    Mental Status and Behavioral Observations  Robyn [name] presented on time to the present encounter and was alert and generally oriented. She was aware of the current and past president. Speech was normal in rate, rhythm, volume, and prosody. Self-reported mood was "good" and affect was euthymic. Thought process was logical and goal oriented and thought content was appropriate to the topics discussed. There were no safety concerns identified at today's encounter, such as thoughts of harming self or others.   Plan  Feedback provided regarding the patient's neuropsychological evaluation. She has LBD, we  reviewed ways to manage dementia behaviors, neuropsychological test performance, and the possibility that this could be more than one degenerative illness. Robyn [name] was encouraged to contact me if any questions arise or if  further follow up is desired.   Robyn Simas Nicole Kindred, PsyD, ABN Clinical Neuropsychologist  Service(s) Provided at This Encounter: 31 minutes [phone]; Conjoint therapy with patient present)

## 2020-11-16 ENCOUNTER — Ambulatory Visit: Payer: Medicare HMO | Admitting: Podiatry

## 2020-11-18 ENCOUNTER — Encounter: Payer: Self-pay | Admitting: Podiatry

## 2020-11-18 ENCOUNTER — Ambulatory Visit: Payer: Medicare HMO | Admitting: Podiatry

## 2020-11-18 ENCOUNTER — Other Ambulatory Visit: Payer: Self-pay

## 2020-11-18 DIAGNOSIS — B351 Tinea unguium: Secondary | ICD-10-CM | POA: Diagnosis not present

## 2020-11-18 DIAGNOSIS — M79675 Pain in left toe(s): Secondary | ICD-10-CM

## 2020-11-18 DIAGNOSIS — E1042 Type 1 diabetes mellitus with diabetic polyneuropathy: Secondary | ICD-10-CM | POA: Diagnosis not present

## 2020-11-18 DIAGNOSIS — M79674 Pain in right toe(s): Secondary | ICD-10-CM

## 2020-11-18 DIAGNOSIS — I519 Heart disease, unspecified: Secondary | ICD-10-CM | POA: Insufficient documentation

## 2020-11-18 DIAGNOSIS — E559 Vitamin D deficiency, unspecified: Secondary | ICD-10-CM | POA: Insufficient documentation

## 2020-11-18 NOTE — Progress Notes (Signed)
This patient returns to my office for at risk foot care.  This patient requires this care by a professional since this patient will be at risk due to having type 1 diabetes.  This patient is unable to cut nails herself since the patient cannot reach her nails.These nails are painful walking and wearing shoes. She presents to the office with her daughter.    This patient presents for at risk foot care today.  General Appearance  Alert, conversant and in no acute stress.  Vascular  Dorsalis pedis and posterior tibial  pulses are  weakly palpable  bilaterally.  Capillary return is within normal limits  bilaterally. Cold feet bilaterally. Absent digital hair  B/L.  Neurologic  Senn-Weinstein monofilament wire test within normal limits  bilaterally. Muscle power within normal limits bilaterally.  Nails Thick disfigured discolored nails with subungual debris  hallux  bilaterally. No evidence of bacterial infection or drainage bilaterally.  Orthopedic  No limitations of motion  feet .  No crepitus or effusions noted.  No bony pathology or digital deformities noted.  Skin  normotropic skin with no porokeratosis noted bilaterally.  No signs of infections or ulcers noted.     Onychomycosis  Pain in right toes  Pain in left toes  Consent was obtained for treatment procedures.   Mechanical debridement of nails 1-5  bilaterally performed with a nail nipper.  Filed with dremel without incident.    Return office visit    3 months                  Told patient to return for periodic foot care and evaluation due to potential at risk complications.   Robyn [name] DPM   

## 2020-11-24 ENCOUNTER — Other Ambulatory Visit: Payer: Self-pay | Admitting: *Deleted

## 2020-11-24 DIAGNOSIS — M8580 Other specified disorders of bone density and structure, unspecified site: Secondary | ICD-10-CM

## 2020-11-25 ENCOUNTER — Other Ambulatory Visit: Payer: Self-pay | Admitting: *Deleted

## 2020-11-25 DIAGNOSIS — M8580 Other specified disorders of bone density and structure, unspecified site: Secondary | ICD-10-CM

## 2020-12-16 ENCOUNTER — Ambulatory Visit: Payer: Medicare PPO | Admitting: Neurology

## 2021-01-26 ENCOUNTER — Ambulatory Visit: Payer: Medicare HMO | Admitting: Podiatry

## 2021-01-26 ENCOUNTER — Other Ambulatory Visit: Payer: Self-pay

## 2021-01-26 DIAGNOSIS — M79674 Pain in right toe(s): Secondary | ICD-10-CM | POA: Diagnosis not present

## 2021-01-26 DIAGNOSIS — M79675 Pain in left toe(s): Secondary | ICD-10-CM | POA: Diagnosis not present

## 2021-01-26 DIAGNOSIS — B351 Tinea unguium: Secondary | ICD-10-CM

## 2021-01-26 DIAGNOSIS — E1042 Type 1 diabetes mellitus with diabetic polyneuropathy: Secondary | ICD-10-CM

## 2021-01-28 NOTE — Progress Notes (Signed)
  Subjective:  Patient ID: Robyn [name], female    DOB: 01/12/1935,  MRN: [id number]  Chief Complaint  Patient presents with   Nail Problem    RFC nail trim bilateral nails 1-5. Pt also complains of [pain in left great toe due to nail digging in her toe. Possible nail fungus of right great toenail.     86 y.o. female she returns for follow-up doing well the nails have become thickened elongated brown-yellow discolored recently again  Objective:  Physical Exam: warm, good capillary refill, reduced left and +2 DP.  No recurrence of ulceration.  She has incurvated elongated and thickened toenails with yellow-brown discoloration x10  Assessment:   1. Pain due to onychomycosis of toenails of both feet   2. Type 1 diabetes mellitus with diabetic polyneuropathy (HCC)      Plan:  Patient was evaluated and treated and all questions answered.  Patient educated on diabetes. Discussed proper diabetic foot care and discussed risks and complications of disease. Educated patient in depth on reasons to return to the office immediately should he/she discover anything concerning or new on the feet. All questions answered. Discussed proper shoes as well.   Discussed the etiology and treatment options for the condition in detail with the patient. Educated patient on the topical and oral treatment options for mycotic nails. Recommended debridement of the nails today. Sharp and mechanical debridement performed of all painful and mycotic nails today. Nails debrided in length and thickness using a nail nipper to level of comfort. Discussed treatment options including appropriate shoe gear. Follow up as needed for painful nails.        Return in about 9 weeks (around 03/30/2021) for at risk diabetic foot care.

## 2021-02-03 ENCOUNTER — Encounter: Payer: Medicare HMO | Admitting: Counselor

## 2021-02-11 ENCOUNTER — Encounter: Payer: Medicare HMO | Admitting: Counselor

## 2021-02-24 ENCOUNTER — Ambulatory Visit: Payer: Medicare HMO | Admitting: Podiatry

## 2021-03-30 ENCOUNTER — Other Ambulatory Visit: Payer: Self-pay

## 2021-03-30 ENCOUNTER — Ambulatory Visit: Payer: Medicare HMO | Admitting: Podiatry

## 2021-03-30 DIAGNOSIS — B351 Tinea unguium: Secondary | ICD-10-CM

## 2021-03-30 DIAGNOSIS — E1042 Type 1 diabetes mellitus with diabetic polyneuropathy: Secondary | ICD-10-CM

## 2021-03-30 DIAGNOSIS — M79674 Pain in right toe(s): Secondary | ICD-10-CM

## 2021-03-30 DIAGNOSIS — M79675 Pain in left toe(s): Secondary | ICD-10-CM

## 2021-03-30 NOTE — Progress Notes (Addendum)
This patient returns to my office for at risk foot care.  This patient requires this care by a professional since this patient will be at risk due to having type 1 diabetes.  This patient is unable to cut nails herself since the patient cannot reach her nails.These nails are painful walking and wearing shoes. She presents to the office with her daughter. This patient presents for at risk foot care today.  General Appearance  Alert, conversant and in no acute stress.  Vascular  Dorsalis pedis and posterior tibial  pulses are  weakly palpable  bilaterally.  Capillary return is within normal limits  bilaterally. Cold feet bilaterally. Absent digital hair  B/L.  Neurologic  Senn-Weinstein monofilament wire test within normal limits  bilaterally. Muscle power within normal limits bilaterally.  Nails Thick disfigured discolored nails with subungual debris  hallux  bilaterally. No evidence of bacterial infection or drainage bilaterally.  Orthopedic  No limitations of motion  feet .  No crepitus or effusions noted.  No bony pathology or digital deformities noted.  Skin  normotropic skin with no porokeratosis noted bilaterally.  No signs of infections or ulcers noted.     Onychomycosis  Pain in right toes  Pain in left toes  Consent was obtained for treatment procedures.   Mechanical debridement of nails 1-5  bilaterally performed with a nail nipper.  Filed with dremel without incident.    Return office visit    10 weeks                  Told patient to return for periodic foot care and evaluation due to potential at risk complications.   Helane Gunther DPM

## 2021-04-08 ENCOUNTER — Ambulatory Visit: Payer: Medicare HMO | Admitting: Nurse Practitioner

## 2021-04-12 NOTE — Progress Notes (Signed)
Assessment/Plan:    Lewy Body Dementia with behavioral disturbance    Recommendations:  Discussed safety both in and out of the home.  Walker was recommended again.  She will need 24/7 monitoring at this point Discussed the importance of regular daily schedule with inclusion of crossword puzzles to maintain brain function.  Continue to monitor mood with Risperdal 0.25 mg bid, or as indicated by her geriatric PCP in the future. Stay active at least 30 minutes at least 3 times a week.  Naps should be scheduled and should be no longer than 60 minutes and should not occur after 2 PM.  Follow up in  6 months.   Case discussed with Dr. Karel Jarvis who agrees with the plan     Subjective:    Robyn [name] is a 86 y.o. right-handed female with a history of hyperlipidemia, diabetes, seen today in follow up for Lewy body dementia. She was first seen on 06/05/2020 at which time MMSE was 15/30. CT of the brain showed disproportionate, odd shaped sulci with an almost entrapped appearance although there is no ventriculomegaly to suggest that this is the result of hydrocephalus. She has mild volume loss and mild areas of hypodensity suggestive of leukokraurosis otherwise.  EEG was normal. Neurocognitive testing 10/28/2020 yielded a diagnosis of Lewy body dementia in view of visual hallucinations, palpitations, parkinsonism, cognitive impairment.  This patient is accompanied in the office by her daughter who supplements the history.  Previous records as well as any outside records available were reviewed prior to todays visit.  Patient is not on antidementia medications, as family feels that risks outweigh the benefits.  The main concern is her hallucinations, as she continues to see filiform images, dead relatives, which tell her "not to eat, not to drink, and not to take the medicines ".  The patient lives alone, and her daughter is very concerned about these.  She does not have any aides that stay  with her when her daughter cannot, and she is afraid for her safety.  The images are not frightening to her, and she carries conversations with this visual and auditory hallucinations.  She states that her "memory comes and goes ".  She denies repeating the same stories or asking the same questions, her daughter states "occasionally ".  She does not like to go outside.  At times, the door is closed as she looks at, she does not know how to open it.  She denies leaving objects in unusual places.  She does not drive.  When she sleeps, she has very vivid dreams, but denies sleepwalking.  As for paranoia, she states that she has "a strange feeling inside, and does not want to be by herself ".  No hygiene concerns, she needs mild assistance with bathing and dressing due to mobility issues.  Her daughter is in charge of the finances.  When she eats "if they let me ", denies trouble swallowing.  She continues to cook, but has forgotten common recipes.  She denies leaving the stove or the faucet on.  She has a walker to ambulate, but does not like to use it.  Denies any falls or head injuries.  She was physical therapy in the past, which has helped.  She denies any headaches, double vision, dizziness, focal numbness or tingling, unilateral weakness, tremors or anosmia.  No history of seizures.  She has some urine incontinence, does not use pads.  She denies constipation or diarrhea.     HISTORY  OF PRESENT ILLNESS 06/05/20: This is an 86 year old right-handed woman with a history of hypertension, hyperlipidmiea, diabetes, dementia, presenting for evaluation of dementia. On her PCP appointment in October 2021, daughter reported she is often disoriented to place and time, and hears voices telling her what to do. She lives alone and her daughters check on her multiple times per week and help with housework. She was evaluated by neurologist Dr. Epimenio Foot for similar symptoms in 2016. Symptoms had started around 2014. At that  time, hallucinations were primarily auditory but sometimes visual. She also reported sleep difficulty with the voices speaking to her when she tries to fall asleep. She had side effects of nausea on Donepezil and was prescribed Memantine and Seroquel. MOCA 26/30 in 11/2014. Etiology of symptoms at that time unclear, she did not have features of LBD or AD. She was started on hydroxyzine then was lost to follow-up.   Over the years, Robyn [name] reports that the auditory hallucinations have worsened. The voices would tell her to go outside sometimes, as far as [name] knows she has not wandered outside. She reports heaviness in her truncal region, that she has gained weight, Robyn [name] states she thinks the people are causing the heaviness behind her. The voices tell her that her laundry is not worth keeping. When they are walking, she wants to go a different way because the voices are telling her to go that way. She would offer food when she is eating. She goes to the bathroom and would be heard saying leave her alone. She would stand and stare, and when asked what she is doing, she says she is talking to them. Initially she stated she does not hear anything, then Robyn [name] reminds her it occurs all the time, she talks to them and waves while she is at [employer]. She states they are in a group, in her house, and can be scary for her, keeping her up at night. She only gets 1-2 hours of sleep at night and naps in the day. She recalls Seroquel (and maybe hydroxyzine) causing dizziness in the past. She says she took these for a long time, but was lost to follow-up.    Robyn [name] thinks her memory is okay. She continues to live alone. She states her memory is "sometimes not very well." She does not drive. She continues to manage her own medications and finances, they deny any issues. She denies leaving the stove on. She has occasional word-finding difficulties. There is no clear REM behavior disorder. She has occasional hand tremors. She always  feel like her balance is off, no falls. She denies any headaches, diplopia, dysarthria/dysphagia, neck pain, focal numbness/tingling/weakness, bowel/bladder dysfunction, anosmia. There is no family history of dementia, no history of significant head injuries or alcohol use.      Laboratory Data: EKG in 06/2019 showed a QTc of 460   I personally reviewed head CTs done over the years, most recently in 06/2019 which showed moderate diffuse atrophy, there appears to be more in the right frontal and occipital regions.  Neurocognitive testing 10/28/2020 Dr. Roseanne Reno Ms. Cuthrell demonstrated impaired performance on measures of processing speed, executive function, and her visuospatial and constructional functioning fell at an unusually low level, suggesting some mild difficulties. She also had marked naming problems and diminished phonemic as compared to semantic fluency. Memory performance was notable for low encoding of information but she retained verbal information well and does not appear to have a frank storage problem at this time. She did  well on measures of attention/working memory. Her CDR places her in the mild dementia range based on the history of provided and her objective test performance, although her daughter characterized her as functioning at a moderate dementia level. It is possible that movement or other issues are confounding her rating or perhaps the patient simply tests well. She did score a 4/4 on the Mayo Fluctuations Questionnaire.   Ms. Cullipher is thus demonstrating a dementia level problem with primary deficits on measures of processing speed, executive functioning, and naming and she has some additional visuospatial/constructional issues. 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 Her test data are weakly supportive although her visuospatial functioning is less  affected than might be expected. A mixed pathological picture is also possible, although her test data are equivocal for AD (has naming problems but no memory storage problem).    Diagnostic Impressions: Lewy Body Dementia with behavioral disturbance  PREVIOUS MEDICATIONS:   CURRENT MEDICATIONS:  Outpatient Encounter Medications as of 04/13/2021  Medication Sig   amLODipine (NORVASC) 10 MG tablet Take 10 mg by mouth daily.   atenolol (TENORMIN) 25 MG tablet Take 1 tablet by mouth daily.   COVID-19 mRNA Vac-TriS, Pfizer, (PFIZER-BIONT COVID-19 VAC-TRIS) SUSP injection Inject into the muscle.   insulin aspart protamine-insulin aspart (NOVOLOG 70/30) (70-30) 100 UNIT/ML injection Inject 25 Units into the skin daily with breakfast. Has in the morning according to blood sugars.   meloxicam (MOBIC) 7.5 MG tablet meloxicam 7.5 mg tablet  Take 1 tablet every day by oral route for 30 days.   NOVOLIN 70/30 RELION (70-30) 100 UNIT/ML injection    [DISCONTINUED] risperiDONE (RISPERDAL) 0.25 MG tablet Take 1 tablet every night for 1 week, then increase to 1 tablet twice a day   risperiDONE (RISPERDAL) 0.25 MG tablet Take 1 tablet  twice a day   No facility-administered encounter medications on file as of 04/13/2021.     Objective:     PHYSICAL EXAMINATION:    VITALS:   Vitals:   04/13/21 0850  BP: (!) 143/69  Pulse: 62  Resp: 18  SpO2: 98%  Weight: 130 lb (59 kg)  Height: 5\' 2"  (1.575 m)    GEN:  The patient appears stated age and is in NAD. HEENT:  Normocephalic, atraumatic.   Neurological examination:  General: NAD, well-groomed, appears stated age. Orientation: The patient is alert. Oriented to person, place and date Cranial nerves: There is good facial symmetry.The speech is fluent and clear. No aphasia or dysarthria. Fund of knowledge is reduced. Recent and remote memory are impaired. Attention and concentration are reduced.  Able to name objects and repeat phrases.  Hearing is  intact to conversational tone.    Sensation: Sensation is intact to light touch throughout Motor: Strength is at least antigravity x4. Tremors: Occasional resting tremor in the right index finger, no postural or action tremors were seen DTR's 1/4 in UE/LE     Montreal Cognitive Assessment  12/02/2014  Visuospatial/ Executive (0/5) 3  Naming (0/3) 3  Attention: Read list of digits (0/2) 2  Attention: Read list of letters (0/1) 1  Attention: Serial 7 subtraction starting at 100 (0/3) 3  Language: Repeat phrase (0/2) 2  Language : Fluency (0/1) 0  Abstraction (0/2) 2  Delayed Recall (0/5) 3  Orientation (0/6) 6  Total 25  Adjusted Score (based on education) 25   MMSE - Mini Mental State Exam  10/28/2020 06/05/2020  Orientation to time 5 5  Orientation to Place 5 2  Registration 3 0  Attention/ Calculation 0 0  Recall 1 0  Language- name 2 objects 2 2  Language- repeat 1 1  Language- follow 3 step command 3 3  Language- read & follow direction 1 1  Write a sentence 1 1  Copy design 1 0  Total score 23 15    No flowsheet data found.     Movement examination: Tone: There is normal tone in the UE/LE Abnormal movements:  no tremor.  No myoclonus.  No asterixis.   Coordination:  There is no decremation with RAM's. Normal finger to nose  Gait and Station: The patient has no difficulty arising out of a deep-seated chair without the use of the hands. The patient's stride length is good.  Gait is cautious and narrow.        Total time spent on today's visit was minutes, including both face-to-face time and nonface-to-face time. Time included that spent on review of records (prior notes available to me/labs/imaging if pertinent), discussing treatment and goals, answering patient's questions and coordinating care.  Cc:  Marva Panda, NP Marlowe Kays, PA-C

## 2021-04-13 ENCOUNTER — Other Ambulatory Visit: Payer: Self-pay

## 2021-04-13 ENCOUNTER — Encounter: Payer: Self-pay | Admitting: Physician Assistant

## 2021-04-13 ENCOUNTER — Ambulatory Visit: Payer: Medicare HMO | Admitting: Physician Assistant

## 2021-04-13 VITALS — BP 143/69 | HR 62 | Resp 18 | Ht 62.0 in | Wt 130.0 lb

## 2021-04-13 DIAGNOSIS — G3183 Dementia with Lewy bodies: Secondary | ICD-10-CM

## 2021-04-13 DIAGNOSIS — F02818 Dementia in other diseases classified elsewhere, unspecified severity, with other behavioral disturbance: Secondary | ICD-10-CM | POA: Diagnosis not present

## 2021-04-13 MED ORDER — RISPERIDONE 0.25 MG PO TABS: ORAL_TABLET | ORAL | 11 refills | Status: DC

## 2021-04-13 NOTE — Patient Instructions (Signed)
   4 Continue Risperdal  1 tablet twice a day  5. Recommend increased supervision at home. Needs 24/7 supervision   6. Follow-up in 6 months, call for any changes

## 2021-04-14 ENCOUNTER — Other Ambulatory Visit: Payer: Self-pay

## 2021-04-14 ENCOUNTER — Ambulatory Visit: Payer: Medicare HMO | Attending: Internal Medicine

## 2021-04-14 ENCOUNTER — Other Ambulatory Visit (HOSPITAL_BASED_OUTPATIENT_CLINIC_OR_DEPARTMENT_OTHER): Payer: Self-pay

## 2021-04-14 DIAGNOSIS — Z23 Encounter for immunization: Secondary | ICD-10-CM

## 2021-04-14 MED ORDER — PFIZER COVID-19 VAC BIVALENT 30 MCG/0.3ML IM SUSP
INTRAMUSCULAR | 0 refills | Status: DC
  Filled 2021-04-14: qty 0.3, 1d supply, fill #0

## 2021-04-14 NOTE — Progress Notes (Signed)
   Covid-19 Vaccination Clinic  Name:  Denai Caba    MRN: [id number] DOB: 17-Oct-1934  04/14/2021  Ms. Adler was observed post Covid-19 immunization for 15 minutes without incident. She was provided with Vaccine Information Sheet and instruction to access the V-Safe system.   Ms. Walls was instructed to call 911 with any severe reactions post vaccine: Difficulty breathing  Swelling of face and throat  A fast heartbeat  A bad rash all over body  Dizziness and weakness   Immunizations Administered     Name Date Dose VIS Date Route   Pfizer Covid-19 Vaccine Bivalent Booster 04/14/2021 10:59 AM 0.3 mL 01/20/2021 Intramuscular   Manufacturer: [employer], [employer]   Lot: [id number]   NDC: [id number]

## 2021-04-27 ENCOUNTER — Encounter: Payer: Self-pay | Admitting: Family Medicine

## 2021-04-27 ENCOUNTER — Other Ambulatory Visit: Payer: Self-pay

## 2021-04-27 ENCOUNTER — Ambulatory Visit (INDEPENDENT_AMBULATORY_CARE_PROVIDER_SITE_OTHER): Payer: Medicare HMO | Admitting: Family Medicine

## 2021-04-27 VITALS — BP 134/82 | HR 59 | Temp 96.0°F | Ht 62.0 in | Wt 129.4 lb

## 2021-04-27 DIAGNOSIS — F02818 Dementia in other diseases classified elsewhere, unspecified severity, with other behavioral disturbance: Secondary | ICD-10-CM

## 2021-04-27 DIAGNOSIS — Z794 Long term (current) use of insulin: Secondary | ICD-10-CM

## 2021-04-27 DIAGNOSIS — G3184 Mild cognitive impairment, so stated: Secondary | ICD-10-CM

## 2021-04-27 DIAGNOSIS — F411 Generalized anxiety disorder: Secondary | ICD-10-CM

## 2021-04-27 DIAGNOSIS — R443 Hallucinations, unspecified: Secondary | ICD-10-CM

## 2021-04-27 DIAGNOSIS — G3183 Dementia with Lewy bodies: Secondary | ICD-10-CM

## 2021-04-27 DIAGNOSIS — Z23 Encounter for immunization: Secondary | ICD-10-CM | POA: Diagnosis not present

## 2021-04-27 DIAGNOSIS — I10 Essential (primary) hypertension: Secondary | ICD-10-CM

## 2021-04-27 DIAGNOSIS — E1140 Type 2 diabetes mellitus with diabetic neuropathy, unspecified: Secondary | ICD-10-CM

## 2021-04-27 NOTE — Progress Notes (Signed)
Provider:  Jacalyn Lefevre, MD  Careteam: Patient Care Team: Frederica Kuster, MD as PCP - General (Family Medicine) Van Clines, MD as Consulting Physician (Neurology) McDonald, Rachelle Hora, DPM as Consulting Physician (Podiatry)  PLACE OF SERVICE:  [facility]  Advanced Directive information Does Patient Have a Medical Advance Directive?: No, Would patient like information on creating a medical advance directive?: No - Patient declined  No Known Allergies  Chief Complaint  Patient presents with   New Patient (Initial Visit)    Patient presents today for a new patient appointment     HPI: Patient is a 86 y.o. female patient is referred from neurology office with help in following medical problems including Lewy body dementia, diabetes, hypertension, Lives alone. Hears voices which tell her to do or not do(take her meds) problem worsening. Now takes risperadal Results for orders placed or performed during the hospital encounter of 06/24/19  CBC with Differential  Result Value Ref Range   WBC 11.2 (H) 4.0 - 10.5 K/uL   RBC 4.47 3.87 - 5.11 MIL/uL   Hemoglobin 13.1 12.0 - 15.0 g/dL   HCT 19.6 22.2 - 97.9 %   MCV 90.4 80.0 - 100.0 fL   MCH 29.3 26.0 - 34.0 pg   MCHC 32.4 30.0 - 36.0 g/dL   RDW 89.2 11.9 - 41.7 %   Platelets 282 150 - 400 K/uL   nRBC 0.0 0.0 - 0.2 %   Neutrophils Relative % 77 %   Neutro Abs 8.6 (H) 1.7 - 7.7 K/uL   Lymphocytes Relative 16 %   Lymphs Abs 1.8 0.7 - 4.0 K/uL   Monocytes Relative 5 %   Monocytes Absolute 0.6 0.1 - 1.0 K/uL   Eosinophils Relative 1 %   Eosinophils Absolute 0.1 0.0 - 0.5 K/uL   Basophils Relative 0 %   Basophils Absolute 0.0 0.0 - 0.1 K/uL   Immature Granulocytes 1 %   Abs Immature Granulocytes 0.07 0.00 - 0.07 K/uL  Basic metabolic panel  Result Value Ref Range   Sodium 137 135 - 145 mmol/L   Potassium 4.0 3.5 - 5.1 mmol/L   Chloride 104 98 - 111 mmol/L   CO2 23 22 - 32 mmol/L   Glucose, Bld 133 (H) 70 - 99 mg/dL    BUN 22 8 - 23 mg/dL   Creatinine, Ser 4.08 0.44 - 1.00 mg/dL   Calcium 9.6 8.9 - 14.4 mg/dL   GFR calc non Af Amer 52 (L) >60 mL/min   GFR calc Af Amer >60 >60 mL/min   Anion gap 10 5 - 15  Urinalysis, Routine w reflex microscopic  Result Value Ref Range   Color, Urine STRAW (A) YELLOW   APPearance CLEAR CLEAR   Specific Gravity, Urine 1.010 1.005 - 1.030   pH 7.0 5.0 - 8.0   Glucose, UA NEGATIVE NEGATIVE mg/dL   Hgb urine dipstick MODERATE (A) NEGATIVE   Bilirubin Urine NEGATIVE NEGATIVE   Ketones, ur NEGATIVE NEGATIVE mg/dL   Protein, ur NEGATIVE NEGATIVE mg/dL   Nitrite NEGATIVE NEGATIVE   Leukocytes,Ua NEGATIVE NEGATIVE   RBC / HPF 0-5 0 - 5 RBC/hpf   WBC, UA 0-5 0 - 5 WBC/hpf   Bacteria, UA NONE SEEN NONE SEEN   Squamous Epithelial / LPF 0-5 0 - 5  CBG monitoring, ED  Result Value Ref Range   Glucose-Capillary 145 (H) 70 - 99 mg/dL  POC CBG, ED  Result Value Ref Range   Glucose-Capillary 61 (L)  70 - 99 mg/dL  CBG monitoring, ED  Result Value Ref Range   Glucose-Capillary 67 (L) 70 - 99 mg/dL  CBG monitoring, ED  Result Value Ref Range   Glucose-Capillary 113 (H) 70 - 99 mg/dL   Problem seems to be worsening.   Review of Systems:  Review of Systems  Constitutional: Negative.   HENT: Negative.    Eyes: Negative.   Respiratory: Negative.    Cardiovascular: Negative.   Gastrointestinal: Negative.   Genitourinary: Negative.   Psychiatric/Behavioral:  Positive for hallucinations and memory loss.   All other systems reviewed and are negative.  Past Medical History:  Diagnosis Date   Coronary artery disease    Dementia (HCC)    Diabetes mellitus    Glaucoma    Hallucination    Heart disease    History of heart artery stent 2009   Hypertension    Memory loss    just started on aricept   Mild cognitive impairment    Osteoarthritis    Phobia    Psychosis (HCC)    Stroke (HCC)    Vision abnormalities    Past Surgical History:  Procedure Laterality  Date   CORONARY ANGIOPLASTY WITH STENT PLACEMENT     CORONARY STENT PLACEMENT     Social History:   reports that she quit smoking about 57 years ago. Her smoking use included cigarettes. She has never used smokeless tobacco. She reports that she does not drink alcohol and does not use drugs.  Family History  Problem Relation Age of Onset   Hypertension Mother    Diabetes Mother    Hypertension Father    Diabetes Father    Hypertension Sister    Diabetes Sister    Kidney disease Sister    Liver disease Brother    Hypertension Daughter    Fibromyalgia Daughter    Rheum arthritis Daughter    Breast cancer Daughter    Hypertension Son    Seizures Granddaughter    Epilepsy Granddaughter     Medications: Patient's Medications  New Prescriptions   No medications on file  Previous Medications   AMLODIPINE (NORVASC) 10 MG TABLET    Take 10 mg by mouth daily.   ATENOLOL (TENORMIN) 25 MG TABLET    Take 1 tablet by mouth daily.   INSULIN ASPART PROTAMINE-INSULIN ASPART (NOVOLOG 70/30) (70-30) 100 UNIT/ML INJECTION    Inject 25 Units into the skin daily with breakfast. Has in the morning according to blood sugars.   NOVOLIN 70/30 RELION (70-30) 100 UNIT/ML INJECTION       RISPERIDONE (RISPERDAL) 0.25 MG TABLET    Take 1 tablet  twice a day  Modified Medications   No medications on file  Discontinued Medications   No medications on file    Physical Exam:  Vitals:   04/27/21 1320  BP: 134/82  Pulse: (!) 59  Temp: (!) 96 F (35.6 C)  SpO2: 98%  Weight: 129 lb 6.4 oz (58.7 kg)  Height: 5\' 2"  (1.575 m)   Body mass index is 23.67 kg/m. Wt Readings from Last 3 Encounters:  04/27/21 129 lb 6.4 oz (58.7 kg)  04/13/21 130 lb (59 kg)  06/05/20 124 lb (56.2 kg)    Physical Exam Vitals and nursing note reviewed.  Constitutional:      Appearance: Normal appearance.  Cardiovascular:     Rate and Rhythm: Normal rate and regular rhythm.  Pulmonary:     Effort: Pulmonary effort  is normal.  Breath sounds: Normal breath sounds.  Abdominal:     General: Abdomen is flat. Bowel sounds are normal.     Palpations: Abdomen is soft.  Musculoskeletal:     Comments: Unsteady on feet but does not use walker  Neurological:     General: No focal deficit present.     Mental Status: She is alert and oriented to person, place, and time.  Psychiatric:        Mood and Affect: Mood normal.        Behavior: Behavior normal.    Labs reviewed: Basic Metabolic Panel: No results for input(s): NA, K, CL, CO2, GLUCOSE, BUN, CREATININE, CALCIUM, MG, PHOS, TSH in the last 8760 hours. Liver Function Tests: No results for input(s): AST, ALT, ALKPHOS, BILITOT, PROT, ALBUMIN in the last 8760 hours. No results for input(s): LIPASE, AMYLASE in the last 8760 hours. No results for input(s): AMMONIA in the last 8760 hours. CBC: No results for input(s): WBC, NEUTROABS, HGB, HCT, MCV, PLT in the last 8760 hours. Lipid Panel: No results for input(s): CHOL, HDL, LDLCALC, TRIG, CHOLHDL, LDLDIRECT in the last 8760 hours. TSH: No results for input(s): TSH in the last 8760 hours. A1C: No results found for: HGBA1C   Assessment/Plan  1. Need for influenza vaccination  - Flu Vaccine QUAD High Dose(Fluad)  2. Type 2 diabetes mellitus with diabetic neuropathy, with long-term current use of insulin (HCC) Encouraged regular use BID - Hemoglobin A1c  3. Dementia, Lewy body with behavior disturbance ([place]) Risperadal is new med; will observe for effectiveness - Ambulatory referral to [place]  4. Anxiety state Using Risperadal   5. HALLUCINATION Same as above  6. Essential hypertension BP good on amlodipine and atenolol  7. Mild cognitive impairment Responds appropriately to questions; knows day/date and recalls what she ate last night   Alain Honey, MD [place] [phone]

## 2021-04-28 LAB — HEMOGLOBIN A1C
Hgb A1c MFr Bld: 7 % of total Hgb — ABNORMAL HIGH (ref <5.7)
Mean Plasma Glucose: 154 mg/dL
eAG (mmol/L): 8.5 mmol/L

## 2021-04-28 NOTE — Addendum Note (Signed)
Addended by: Margo Common on: 04/28/2021 09:34 AM   Modules accepted: Orders

## 2021-05-03 ENCOUNTER — Telehealth: Payer: Self-pay | Admitting: *Deleted

## 2021-05-03 NOTE — Telephone Encounter (Signed)
Robyn [name] with [facility] called requesting verbal orders for Nursing 1X3weeks, 1 every 2 weeks x 2, 1X2 weeks and 1 every 2weeks x 2.  Verbal orders given.

## 2021-05-11 ENCOUNTER — Ambulatory Visit: Payer: Medicare HMO | Admitting: Nurse Practitioner

## 2021-05-22 ENCOUNTER — Encounter (HOSPITAL_COMMUNITY): Payer: Self-pay | Admitting: Emergency Medicine

## 2021-05-22 ENCOUNTER — Emergency Department (HOSPITAL_COMMUNITY)
Admission: EM | Admit: 2021-05-22 | Discharge: 2021-05-23 | Disposition: A | Discharge status: Home / Self Care | Payer: Medicare HMO | Attending: Emergency Medicine | Admitting: Emergency Medicine

## 2021-05-22 ENCOUNTER — Other Ambulatory Visit: Payer: Self-pay

## 2021-05-22 ENCOUNTER — Emergency Department (HOSPITAL_COMMUNITY): Payer: Medicare HMO

## 2021-05-22 DIAGNOSIS — R443 Hallucinations, unspecified: Secondary | ICD-10-CM | POA: Diagnosis not present

## 2021-05-22 DIAGNOSIS — F0390 Unspecified dementia without behavioral disturbance: Secondary | ICD-10-CM | POA: Insufficient documentation

## 2021-05-22 DIAGNOSIS — I10 Essential (primary) hypertension: Secondary | ICD-10-CM | POA: Diagnosis not present

## 2021-05-22 DIAGNOSIS — Z794 Long term (current) use of insulin: Secondary | ICD-10-CM | POA: Insufficient documentation

## 2021-05-22 DIAGNOSIS — R8290 Unspecified abnormal findings in urine: Secondary | ICD-10-CM | POA: Diagnosis not present

## 2021-05-22 DIAGNOSIS — Z87891 Personal history of nicotine dependence: Secondary | ICD-10-CM | POA: Diagnosis not present

## 2021-05-22 DIAGNOSIS — Z79899 Other long term (current) drug therapy: Secondary | ICD-10-CM | POA: Diagnosis not present

## 2021-05-22 DIAGNOSIS — I2510 Atherosclerotic heart disease of native coronary artery without angina pectoris: Secondary | ICD-10-CM | POA: Insufficient documentation

## 2021-05-22 DIAGNOSIS — E1140 Type 2 diabetes mellitus with diabetic neuropathy, unspecified: Secondary | ICD-10-CM | POA: Insufficient documentation

## 2021-05-22 DIAGNOSIS — R4182 Altered mental status, unspecified: Secondary | ICD-10-CM

## 2021-05-22 LAB — CBG MONITORING, ED: Glucose-Capillary: 172 mg/dL — ABNORMAL HIGH (ref 70–99)

## 2021-05-22 NOTE — ED Triage Notes (Signed)
Pt BIB [facility] from home, family reports pt was hallucinating and confused, hx dementia. At this time, pt answering questions appropriately, denies complaints.

## 2021-05-22 NOTE — ED Provider Notes (Signed)
[facility] EMERGENCY DEPARTMENT Provider Note   CSN: [id number] Arrival date & time: 05/22/21  2259     History Chief Complaint  Patient presents with   Hallucinations    Robyn [name] is a 86 y.o. female.  Patient here with chief complaint of reported hallucination and confusion at home per family.  She has history of dementia.  Patient responds to questions appropriately now.  She states that she recently had cough and pneumonia.  She also states that she has had some dysuria.  She denies any other symptoms.   Per daughter, Robyn [name] - [phone], who states that the patient lives by her self.  She reports that earlier today, the patient was hearing voices (not unusual for her), but that the voices were telling her to protect herself.  She asked the daughter how to get a gun permit.  Daughter is currently working with social work to get power of attorney.  Patient has home health that comes in once a week, but reportedly, neurology recommends full-time assisted living.    The history is provided by the patient. No language interpreter was used.      Past Medical History:  Diagnosis Date   Coronary artery disease    Dementia (HCC)    Diabetes mellitus    Glaucoma    Hallucination    Heart disease    History of heart artery stent 2009   Hypertension    Memory loss    just started on aricept   Mild cognitive impairment    Osteoarthritis    Phobia    Psychosis (HCC)    Stroke [facility])    Vision abnormalities     Patient Active Problem List   Diagnosis Date Noted   Type 2 diabetes mellitus with diabetic neuropathy, unspecified (HCC) 04/27/2021   Heart disease 11/18/2020   Vitamin D deficiency 11/18/2020   Dementia, Lewy body with behavior disturbance (HCC) 11/05/2020   General psychoses (HCC) 12/02/2014   Psychosis (HCC) 12/02/2014   Mild cognitive impairment 12/02/2014   HYPERLIPIDEMIA 07/16/2008   Anxiety state 07/16/2008    PHOBIA 07/16/2008   GLAUCOMA 07/16/2008   Essential hypertension 07/16/2008   CORONARY ARTERY DISEASE, S/P PTCA 07/16/2008   ALLERGIC RHINITIS, SEASONAL 07/16/2008   OSTEOARTHRITIS 07/16/2008   HALLUCINATION 07/16/2008   Insomnia 07/16/2008   WEIGHT LOSS, ABNORMAL 07/16/2008   DYSPNEA 07/16/2008   PERSONAL HX TIA & CI W/O RESIDUAL DEFICITS 07/16/2008    Past Surgical History:  Procedure Laterality Date   CORONARY ANGIOPLASTY WITH STENT PLACEMENT     CORONARY STENT PLACEMENT       OB History   No obstetric history on file.     Family History  Problem Relation Age of Onset   Hypertension Mother    Diabetes Mother    Hypertension Father    Diabetes Father    Hypertension Sister    Diabetes Sister    Kidney disease Sister    Liver disease Brother    Hypertension Daughter    Fibromyalgia Daughter    Rheum arthritis Daughter    Breast cancer Daughter    Hypertension Son    Seizures Granddaughter    Epilepsy Granddaughter     Social History   Tobacco Use   Smoking status: Former    Types: Cigarettes    Quit date: 10/08/1963    Years since quitting: 57.6   Smokeless tobacco: Never  Vaping Use   Vaping Use: Never used  Substance Use  Topics   Alcohol use: No   Drug use: No    Home Medications Prior to Admission medications   Medication Sig Start Date End Date Taking? Authorizing Provider  amLODipine (NORVASC) 10 MG tablet Take 10 mg by mouth daily. 05/15/19   [provider]  atenolol (TENORMIN) 25 MG tablet Take 1 tablet by mouth daily. 08/08/11   [provider]  insulin aspart protamine-insulin aspart (NOVOLOG 70/30) (70-30) 100 UNIT/ML injection Inject 25 Units into the skin daily with breakfast. Has in the morning according to blood sugars.    [provider]  NOVOLIN 70/30 RELION (70-30) 100 UNIT/ML injection  02/17/20   [provider]  risperiDONE (RISPERDAL) 0.25 MG tablet Take 1 tablet  twice a day 04/13/21   Rondel Jumbo, PA-C    Allergies    Patient has no known allergies.  Review of Systems   Review of Systems  All other systems reviewed and are negative.  Physical Exam Updated Vital Signs BP (!) 144/62    Pulse 67    Temp 98.9 F (37.2 C) (Oral)    Resp 18    SpO2 99%   Physical Exam Vitals and nursing note reviewed.  Constitutional:      General: She is not in acute distress.    Appearance: She is well-developed.  HENT:     Head: Normocephalic and atraumatic.  Eyes:     Conjunctiva/sclera: Conjunctivae normal.  Cardiovascular:     Rate and Rhythm: Normal rate and regular rhythm.     Heart sounds: No murmur heard. Pulmonary:     Effort: Pulmonary effort is normal. No respiratory distress.     Breath sounds: Normal breath sounds.  Abdominal:     Palpations: Abdomen is soft.     Tenderness: There is no abdominal tenderness.  Musculoskeletal:        General: No swelling.     Cervical back: Neck supple.  Skin:    General: Skin is warm and dry.     Capillary Refill: Capillary refill takes less than 2 seconds.  Neurological:     Mental Status: She is alert.     Comments: Alert, oriented to person, time, and place Answers questions appropriately  Psychiatric:        Mood and Affect: Mood normal.    ED Results / Procedures / Treatments   Labs (all labs ordered are listed, but only abnormal results are displayed) Labs Reviewed  URINALYSIS, ROUTINE W REFLEX MICROSCOPIC  CBC WITH DIFFERENTIAL/PLATELET  BASIC METABOLIC PANEL  CBG MONITORING, ED    EKG None  Radiology No results found.  Procedures Procedures   Medications Ordered in ED Medications - No data to display  ED Course  I have reviewed the triage vital signs and the nursing notes.  Pertinent labs & imaging results that were available during my care of the patient were reviewed by me and considered in my medical decision making (see chart for details).    MDM Rules/Calculators/A&P                           Patient here with history of dementia.  She was sent to the emergency department by her daughter.  Daughter states that she has been hearing voices, which not new for her.  She states that she was acting more confused than normal today.  On my assessment, patient responds to questions appropriately.  She is calm and not agitated.  The patient states that she has had some discomfort when she urinates.  I discussed with the daughter that infection could cause worsening confusion.  She is not febrile.  Urinalysis is abnormal, will cover for UTI with Keflex.  Old chest x-ray shows no evidence of pneumonia.  Discussed with family that she should follow-up with her doctor for new or worsening symptoms, return to the emergency department as needed.    Final Clinical Impression(s) / ED Diagnoses Final diagnoses:  Hallucinations  Abnormal urinalysis    Rx / DC Orders ED Discharge Orders     None        Montine Circle, PA-C 05/23/21 [id number]    Mesner, Corene Cornea, MD 05/23/21 [phone]

## 2021-05-23 LAB — CBC WITH DIFFERENTIAL/PLATELET
Abs Immature Granulocytes: 0.01 10*3/uL (ref 0.00–0.07)
Basophils Absolute: 0 10*3/uL (ref 0.0–0.1)
Basophils Relative: 0 %
Eosinophils Absolute: 0.1 10*3/uL (ref 0.0–0.5)
Eosinophils Relative: 2 %
HCT: 35.6 % — ABNORMAL LOW (ref 36.0–46.0)
Hemoglobin: 11.7 g/dL — ABNORMAL LOW (ref 12.0–15.0)
Immature Granulocytes: 0 %
Lymphocytes Relative: 33 %
Lymphs Abs: 1.9 10*3/uL (ref 0.7–4.0)
MCH: 29.3 pg (ref 26.0–34.0)
MCHC: 32.9 g/dL (ref 30.0–36.0)
MCV: 89 fL (ref 80.0–100.0)
Monocytes Absolute: 0.6 10*3/uL (ref 0.1–1.0)
Monocytes Relative: 9 %
Neutro Abs: 3.3 10*3/uL (ref 1.7–7.7)
Neutrophils Relative %: 56 %
Platelets: 239 10*3/uL (ref 150–400)
RBC: 4 MIL/uL (ref 3.87–5.11)
RDW: 13.1 % (ref 11.5–15.5)
WBC: 5.9 10*3/uL (ref 4.0–10.5)
nRBC: 0 % (ref 0.0–0.2)

## 2021-05-23 LAB — BASIC METABOLIC PANEL
Anion gap: 6 (ref 5–15)
BUN: 19 mg/dL (ref 8–23)
CO2: 22 mmol/L (ref 22–32)
Calcium: 9.3 mg/dL (ref 8.9–10.3)
Chloride: 108 mmol/L (ref 98–111)
Creatinine, Ser: 1.07 mg/dL — ABNORMAL HIGH (ref 0.44–1.00)
GFR, Estimated: 51 mL/min — ABNORMAL LOW (ref >60)
Glucose, Bld: 151 mg/dL — ABNORMAL HIGH (ref 70–99)
Potassium: 4 mmol/L (ref 3.5–5.1)
Sodium: 136 mmol/L (ref 135–145)

## 2021-05-23 LAB — URINALYSIS, ROUTINE W REFLEX MICROSCOPIC
Bacteria, UA: NONE SEEN
Bilirubin Urine: NEGATIVE
Glucose, UA: NEGATIVE mg/dL
Hgb urine dipstick: NEGATIVE
Ketones, ur: NEGATIVE mg/dL
Nitrite: NEGATIVE
Protein, ur: NEGATIVE mg/dL
Specific Gravity, Urine: 1.019 (ref 1.005–1.030)
pH: 5 (ref 5.0–8.0)

## 2021-05-23 MED ORDER — CEPHALEXIN 250 MG PO CAPS
500.0000 mg | ORAL_CAPSULE | Freq: Once | ORAL | Status: AC
  Administered 2021-05-23: 500 mg via ORAL
  Filled 2021-05-23: qty 2

## 2021-05-23 MED ORDER — LORAZEPAM 2 MG/ML IJ SOLN: 0.0000 mg | Freq: Four times a day (QID) | INTRAMUSCULAR | Status: DC

## 2021-05-23 MED ORDER — THIAMINE HCL 100 MG/ML IJ SOLN: 100.0000 mg | Freq: Every day | INTRAMUSCULAR | Status: DC

## 2021-05-23 MED ORDER — LORAZEPAM 1 MG PO TABS: 0.0000 mg | ORAL_TABLET | Freq: Four times a day (QID) | ORAL | Status: DC

## 2021-05-23 MED ORDER — CEPHALEXIN 500 MG PO CAPS: 500.0000 mg | ORAL_CAPSULE | Freq: Two times a day (BID) | ORAL | 0 refills | Status: DC

## 2021-05-23 MED ORDER — THIAMINE HCL 100 MG PO TABS: 100.0000 mg | ORAL_TABLET | Freq: Every day | ORAL | Status: DC

## 2021-05-23 MED ORDER — LORAZEPAM 2 MG/ML IJ SOLN: 0.0000 mg | Freq: Two times a day (BID) | INTRAMUSCULAR | Status: DC

## 2021-05-23 MED ORDER — LORAZEPAM 1 MG PO TABS: 0.0000 mg | ORAL_TABLET | Freq: Two times a day (BID) | ORAL | Status: DC

## 2021-05-23 NOTE — ED Notes (Signed)
Provider bedside.

## 2021-05-27 ENCOUNTER — Encounter: Payer: Self-pay | Admitting: Nurse Practitioner

## 2021-05-27 ENCOUNTER — Other Ambulatory Visit: Payer: Self-pay

## 2021-05-27 ENCOUNTER — Ambulatory Visit (INDEPENDENT_AMBULATORY_CARE_PROVIDER_SITE_OTHER): Payer: Medicare HMO | Admitting: Nurse Practitioner

## 2021-05-27 VITALS — BP 110/70 | HR 63 | Temp 97.0°F | Ht 62.0 in | Wt 132.2 lb

## 2021-05-27 DIAGNOSIS — N898 Other specified noninflammatory disorders of vagina: Secondary | ICD-10-CM | POA: Diagnosis not present

## 2021-05-27 DIAGNOSIS — R490 Dysphonia: Secondary | ICD-10-CM

## 2021-05-27 DIAGNOSIS — N3000 Acute cystitis without hematuria: Secondary | ICD-10-CM

## 2021-05-27 NOTE — Patient Instructions (Addendum)
To get the 3 day MONOSTAT kit for itching/possible yeast  Low sugar yogurt daily   To start probiotic twice daily   Plain nasal saline spray throughout the day as needed May use tylenol 500 mg 2 tablets every 8 hours as needed aches and pains or sore throat humidifier in the home to help with the dry air Mucinex DM by mouth twice daily as needed for cough and chest congestion with full glass of water  Keep well hydrated Proper nutrition  Vit c 1000 mg daily Vit d 2000 units daily  Zinc 50 mg daily

## 2021-05-27 NOTE — Progress Notes (Signed)
Careteam: Patient Care Team: Wardell Honour, MD as PCP - General (Family Medicine) Cameron Sprang, MD as Consulting Physician (Neurology) Sherryle Lis, Stephan Minister, DPM as Consulting Physician (Podiatry)  PLACE OF SERVICE:  [place] Directive information    No Known Allergies  Chief Complaint  Patient presents with   Acute Visit    Patient has UTI. She went to ED over weekend because she was having hallucinations. Patient is here with daughter today. Patient is taking Keflex for UTI. Patient is having itching in vaginal area and would like to know about getting medication for yeast infection. Patient has shortness of breath every now and then.     HPI: Patient is a 87 y.o. female due to vaginal itching. Wellness nurse came today and she talked about her itching.  Nurse stated she should start on medication for itching/yeast.  She is currently taking antibiotic for UTI after visit to the ED- she went to ED due to having severe hallucinations.  These have improved.    Reports scratchy throat. No cough. Some shortness of breath at night. Feels really dry. Reports this has been ongoing for 6 months. Daughter states she keeps the house really warm.  Daughter reports she sounds like she is going hoarse.    Review of Systems:  Review of Systems  Constitutional:  Negative for chills, fever and malaise/fatigue.  HENT:  Negative for congestion, ear discharge, ear pain and sore throat.   Respiratory:  Negative for cough and shortness of breath.   Cardiovascular:  Negative for chest pain and palpitations.  Genitourinary:        Vaginal itching  Skin:  Positive for itching (occasional vaginal itching). Negative for rash.  Psychiatric/Behavioral:  Positive for memory loss.    Past Medical History:  Diagnosis Date   Coronary artery disease    Dementia ([place])    Diabetes mellitus    Glaucoma    Hallucination    Heart disease    History of heart artery stent 2009    Hypertension    Memory loss    just started on aricept   Mild cognitive impairment    Osteoarthritis    Phobia    Psychosis ([place])    Stroke ([place])    Vision abnormalities    Past Surgical History:  Procedure Laterality Date   CORONARY ANGIOPLASTY WITH STENT PLACEMENT     CORONARY STENT PLACEMENT     Social History:   reports that she quit smoking about 57 years ago. Her smoking use included cigarettes. She has never used smokeless tobacco. She reports that she does not drink alcohol and does not use drugs.  Family History  Problem Relation Age of Onset   Hypertension Mother    Diabetes Mother    Hypertension Father    Diabetes Father    Hypertension Sister    Diabetes Sister    Kidney disease Sister    Liver disease Brother    Hypertension Daughter    Fibromyalgia Daughter    Rheum arthritis Daughter    Breast cancer Daughter    Hypertension Son    Seizures Granddaughter    Epilepsy Granddaughter     Medications: Patient's Medications  New Prescriptions   No medications on file  Previous Medications   AMLODIPINE (NORVASC) 10 MG TABLET    Take 10 mg by mouth daily.   CEPHALEXIN (KEFLEX) 500 MG CAPSULE    Take 1 capsule (500 mg total) by mouth 2 (two)  times daily.   NOVOLIN 70/30 RELION (70-30) 100 UNIT/ML INJECTION    25 Units.   RISPERIDONE (RISPERDAL) 0.25 MG TABLET    Take 1 tablet  twice a day  Modified Medications   No medications on file  Discontinued Medications   ATENOLOL (TENORMIN) 25 MG TABLET    Take 1 tablet by mouth daily.   INSULIN ASPART PROTAMINE-INSULIN ASPART (NOVOLOG 70/30) (70-30) 100 UNIT/ML INJECTION    Inject 25 Units into the skin daily with breakfast. Has in the morning according to blood sugars.    Physical Exam:  Vitals:   05/27/21 1421  BP: 110/70  Pulse: 63  Temp: (!) 97 F (36.1 C)  SpO2: 97%  Weight: 132 lb 3.2 oz (60 kg)  Height: _0  (1.575 m)   Body mass index is 24.18 kg/m. Wt Readings from Last 3 Encounters:   05/27/21 132 lb 3.2 oz (60 kg)  04/27/21 129 lb 6.4 oz (58.7 kg)  04/13/21 130 lb (59 kg)    Physical Exam Constitutional:      General: She is not in acute distress.    Appearance: She is well-developed. She is not diaphoretic.  HENT:     Head: Normocephalic and atraumatic.     Right Ear: Tympanic membrane and ear canal normal.     Left Ear: Tympanic membrane and ear canal normal.     Nose: No congestion or rhinorrhea.     Mouth/Throat:     Pharynx: No oropharyngeal exudate.  Eyes:     Conjunctiva/sclera: Conjunctivae normal.     Pupils: Pupils are equal, round, and reactive to light.  Cardiovascular:     Rate and Rhythm: Normal rate and regular rhythm.     Heart sounds: Normal heart sounds.  Pulmonary:     Effort: Pulmonary effort is normal.     Breath sounds: Normal breath sounds.  Abdominal:     General: Bowel sounds are normal.     Palpations: Abdomen is soft.  Musculoskeletal:     Cervical back: Normal range of motion and neck supple.     Right lower leg: No edema.     Left lower leg: No edema.  Skin:    General: Skin is warm and dry.  Neurological:     Mental Status: She is alert. Mental status is at baseline.  Psychiatric:        Mood and Affect: Mood normal.    Labs reviewed: Basic Metabolic Panel: Recent Labs    05/23/21 0107  NA 136  K 4.0  CL 108  CO2 22  GLUCOSE 151*  BUN 19  CREATININE 1.07*  CALCIUM 9.3   Liver Function Tests: No results for input(s): AST, ALT, ALKPHOS, BILITOT, PROT, ALBUMIN in the last 8760 hours. No results for input(s): LIPASE, AMYLASE in the last 8760 hours. No results for input(s): AMMONIA in the last 8760 hours. CBC: Recent Labs    05/23/21 0107  WBC 5.9  NEUTROABS 3.3  HGB 11.7*  HCT 35.6*  MCV 89.0  PLT 239   Lipid Panel: No results for input(s): CHOL, HDL, LDLCALC, TRIG, CHOLHDL, LDLDIRECT in the last 8760 hours. TSH: No results for input(s): TSH in the last 8760 hours. A1C: Lab Results  Component  Value Date   HGBA1C 7.0 (H) 04/27/2021     Assessment/Plan 1. Vaginal itching She is currently being treated for UTI with antibiotics will have her start OTC monostat 3 day kit for yeast. To follow up if symptoms fail to improve  or worsen   2. Hoarseness -possibly early signs of URI. Supportive care recommendations given.   3. Acute cystitis without hematuria Continues on keflex as prescribed, symptoms improved.    Next appt: 07/27/2021 as scheduled, sooner if needed  Illianna Paschal K. [place], [facility] [phone]

## 2021-06-04 ENCOUNTER — Encounter: Payer: Self-pay | Admitting: Podiatry

## 2021-06-04 ENCOUNTER — Ambulatory Visit: Payer: Medicare HMO | Admitting: Podiatry

## 2021-06-04 ENCOUNTER — Other Ambulatory Visit: Payer: Self-pay

## 2021-06-04 DIAGNOSIS — B351 Tinea unguium: Secondary | ICD-10-CM | POA: Diagnosis not present

## 2021-06-04 DIAGNOSIS — M79675 Pain in left toe(s): Secondary | ICD-10-CM

## 2021-06-04 DIAGNOSIS — M79674 Pain in right toe(s): Secondary | ICD-10-CM | POA: Diagnosis not present

## 2021-06-04 DIAGNOSIS — E1042 Type 1 diabetes mellitus with diabetic polyneuropathy: Secondary | ICD-10-CM | POA: Diagnosis not present

## 2021-06-04 NOTE — Progress Notes (Signed)
This patient returns to my office for at risk foot care.  This patient requires this care by a professional since this patient will be at risk due to having type 1 diabetes.  This patient is unable to cut nails herself since the patient cannot reach her nails.These nails are painful walking and wearing shoes. She presents to the office with her daughter. This patient presents for at risk foot care today.  General Appearance  Alert, conversant and in no acute stress.  Vascular  Dorsalis pedis and posterior tibial  pulses are  weakly palpable  bilaterally.  Capillary return is within normal limits  bilaterally. Cold feet bilaterally. Absent digital hair  B/L.  Neurologic  Senn-Weinstein monofilament wire test within normal limits  bilaterally. Muscle power within normal limits bilaterally.  Nails Thick disfigured discolored nails with subungual debris  hallux  bilaterally. No evidence of bacterial infection or drainage bilaterally.  Orthopedic  No limitations of motion  feet .  No crepitus or effusions noted.  No bony pathology or digital deformities noted.  Skin  normotropic skin with no porokeratosis noted bilaterally.  No signs of infections or ulcers noted.     Onychomycosis  Pain in right toes  Pain in left toes  Consent was obtained for treatment procedures.   Mechanical debridement of nails 1-5  bilaterally performed with a nail nipper.  Filed with dremel without incident.    Return office visit    10 weeks                  Told patient to return for periodic foot care and evaluation due to potential at risk complications.   Blanchard Willhite DPM   

## 2021-06-07 ENCOUNTER — Ambulatory Visit: Payer: Medicare HMO | Admitting: Podiatry

## 2021-06-28 ENCOUNTER — Other Ambulatory Visit: Payer: Self-pay

## 2021-06-28 ENCOUNTER — Encounter: Payer: Self-pay | Admitting: Physician Assistant

## 2021-06-28 ENCOUNTER — Ambulatory Visit: Payer: Medicare HMO | Admitting: Physician Assistant

## 2021-06-28 VITALS — BP 141/68 | HR 63 | Ht 59.0 in | Wt 133.6 lb

## 2021-06-28 DIAGNOSIS — G3183 Dementia with Lewy bodies: Secondary | ICD-10-CM

## 2021-06-28 DIAGNOSIS — F02818 Dementia in other diseases classified elsewhere, unspecified severity, with other behavioral disturbance: Secondary | ICD-10-CM | POA: Diagnosis not present

## 2021-06-28 MED ORDER — DIVALPROEX SODIUM 125 MG PO DR TAB: 125.0000 mg | DELAYED_RELEASE_TABLET | Freq: Every evening | ORAL | 3 refills | Status: DC

## 2021-06-28 NOTE — Progress Notes (Signed)
Assessment/Plan:    Lewy Body Dementia with behavioral disturbance   Recommendations:  Discussed safety both in and out of the home.  Walker was recommended again.  She will need 24/7 monitoring at this point Referral to Social Work for possible placement  Discussed the importance of regular daily schedule to maintain brain function.  Continue to monitor mood, will start Depakote 125 mg nightly, may need to be increased accordingly. Stay active at least 30 minutes at least 3 times a week.  Naps should be scheduled and should be no longer than 60 minutes and should not occur after 2 PM.  Follow up in 3 months.   Case discussed with Dr. Karel JarvisAquino who agrees with the plan   Subjective:    Wilnette Kaleshelma Mardi MainlandRebecca Grandt is a 87 y.o. right-handed female with a history of hyperlipidemia, diabetes, seen today in follow up for Lewy body dementia. She was first seen on 06/05/2020 at which time MMSE was 15/30. CT of the brain showed disproportionate, odd shaped sulci with an almost entrapped appearance although there is no ventriculomegaly to suggest that this is the result of hydrocephalus. She has mild volume loss and mild areas of hypodensity suggestive of leukokraurosis otherwise.  EEG was normal. Neurocognitive testing 10/28/2020 yielded a diagnosis of Lewy body dementia in view of visual hallucinations, palpitations, parkinsonism, cognitive impairment.  She was last seen on 04/13/2021.  This patient is accompanied in the office by her daughter who supplements the history.   Previous records as well as any outside records available were reviewed prior to todays visit.  Patient is not on antidementia medications, as family feels that risks outweigh the benefits.  The main concern is her hallucinations, as she continues to see filiform images, dead relatives, which tell her "not to eat, not to drink, and not to take the medicines ".  The patient lives alone and her daughter is very concerned about this.  She  does not have any aides that stay with her when her daughter cannot, and she is afraid for her safety.  The images are not frightening to her, and she carries conversations with this visual and auditory hallucinations.  Seroquel was initially tried, "it did not help ".  She has not been on any antipsychotic for the last month.  On 05/22/2021, she was seen at the emergency department with hallucinations, and hearing voices, "the voices says were telling me to protect myself ".  She even asked the daughter to get a gun permit.  Sometimes she yells at the TV.  Her daughter reports that her "memory comes and goes ".  She denies repeating the same stories or asking the same questions. She does not like to go outside.  At times, the door is closed as she looks at, she does not know how to open it.  She denies leaving objects in unusual places.  She no longer drives.   She reports very vivid dreams when sleeping, but denies sleepwalking.  She reports paranoia "a strange feeling inside, and does not want to be by myself".  No hygiene concerns, she needs mild assistance with bathing and dressing due to mobility issues.  Her daughter is in charge of the finances.  When she eats "if they let me ", denies trouble swallowing. She admits to not drinking enough water.  She does not cook.  She has a walker to ambulate, but does not like to use it.  Denies any falls or head injuries.  She was physical  therapy in the past, which has helped.  She denies any headaches, double vision, dizziness, focal numbness or tingling, unilateral weakness, tremors or anosmia.  No history of seizures.  She has some urine incontinence, does not use pads. She had a UTI during the ER presentations in 12/21.  She denies constipation or diarrhea.     HISTORY OF PRESENT ILLNESS 06/05/20: This is an 87 year old right-handed woman with a history of hypertension, hyperlipidemia, diabetes, dementia, presenting for evaluation of dementia. On her PCP  appointment in October 2021, daughter reported she is often disoriented to place and time, and hears voices telling her what to do. She lives alone and her daughters check on her multiple times per week and help with housework. She was evaluated by neurologist Dr. Epimenio Foot for similar symptoms in 2016. Symptoms had started around 2014. At that time, hallucinations were primarily auditory but sometimes visual. She also reported sleep difficulty with the voices speaking to her when she tries to fall asleep. She had side effects of nausea on Donepezil and was prescribed Memantine and Seroquel. MOCA 26/30 in 11/2014. Etiology of symptoms at that time unclear, she did not have features of LBD or AD. She was started on hydroxyzine then was lost to follow-up.   Over the years, [name] reports that the auditory hallucinations have worsened. The voices would tell her to go outside sometimes, as far as [name] knows she has not wandered outside. She reports heaviness in her truncal region, that she has gained weight, [name] states she thinks the people are causing the heaviness behind her. The voices tell her that her laundry is not worth keeping. When they are walking, she wants to go a different way because the voices are telling her to go that way. She would offer food when she is eating. She goes to the bathroom and would be heard saying leave her alone. She would stand and stare, and when asked what she is doing, she says she is talking to them. Initially she stated she does not hear anything, then [name] reminds her it occurs all the time, she talks to them and waves while she is at [employer]. She states they are in a group, in her house, and can be scary for her, keeping her up at night. She only gets 1-2 hours of sleep at night and naps in the day. She recalls Seroquel (and maybe hydroxyzine) causing dizziness in the past. She says she took these for a long time, but was lost to follow-up.    [name] thinks her memory is okay. She  continues to live alone. She states her memory is "sometimes not very well." She does not drive. She continues to manage her own medications and finances, they deny any issues. She denies leaving the stove on. She has occasional word-finding difficulties. There is no clear REM behavior disorder. She has occasional hand tremors. She always feel like her balance is off, no falls. She denies any headaches, diplopia, dysarthria/dysphagia, neck pain, focal numbness/tingling/weakness, bowel/bladder dysfunction, anosmia. There is no family history of dementia, no history of significant head injuries or alcohol use.      Laboratory Data: EKG in 06/2019 showed a QTc of 460   I personally reviewed head CTs done over the years, most recently in 06/2019 which showed moderate diffuse atrophy, there appears to be more in the right frontal and occipital regions.  Neurocognitive testing 10/28/2020 Dr. Roseanne Reno Ms. Laduke demonstrated impaired performance on measures of processing speed, executive function,  and her visuospatial and constructional functioning fell at an unusually low level, suggesting some mild difficulties. She also had marked naming problems and diminished phonemic as compared to semantic fluency. Memory performance was notable for low encoding of information but she retained verbal information well and does not appear to have a frank storage problem at this time. She did well on measures of attention/working memory. Her CDR places her in the mild dementia range based on the history of provided and her objective test performance, although her daughter characterized her as functioning at a moderate dementia level. It is possible that movement or other issues are confounding her rating or perhaps the patient simply tests well. She did score a 4/4 on the Mayo Fluctuations Questionnaire.   Ms. Dorrough is thus demonstrating a dementia level problem with primary deficits on measures of processing speed, executive  functioning, and naming and she has some additional visuospatial/constructional issues. 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 Her test data are weakly supportive although her visuospatial functioning is less affected than might be expected. A mixed pathological picture is also possible, although her test data are equivocal for AD (has naming problems but no memory storage problem).    Diagnostic Impressions: Lewy Body Dementia with behavioral disturbance  PREVIOUS MEDICATIONS:   CURRENT MEDICATIONS:  Outpatient Encounter Medications as of 06/28/2021  Medication Sig   amLODipine (NORVASC) 10 MG tablet Take 10 mg by mouth daily.   NOVOLIN 70/30 RELION (70-30) 100 UNIT/ML injection 25 Units.   risperiDONE (RISPERDAL) 0.25 MG tablet Take 1 tablet  twice a day   [DISCONTINUED] cephALEXin (KEFLEX) 500 MG capsule Take 1 capsule (500 mg total) by mouth 2 (two) times daily.   No facility-administered encounter medications on file as of 06/28/2021.     Objective:     PHYSICAL EXAMINATION:    VITALS:   Vitals:   06/28/21 1302  BP: (!) 141/68  Pulse: 63  SpO2: 98%  Weight: 133 lb 9.6 oz (60.6 kg)  Height: 4\' 11"  (1.499 m)     GEN:  The patient appears stated age and is in NAD. HEENT:  Normocephalic, atraumatic.   Neurological examination:  General: NAD, well-groomed, appears stated age. Orientation: The patient is alert. Not oriented to  place and date, but to person Cranial nerves: There is good facial symmetry.The speech is tangential and clear. No aphasia or dysarthria. Fund of knowledge is reduced. Recent and remote memory are impaired. Attention and concentration are reduced.  Able to name objects and repeat phrases.  Hearing is intact to conversational tone.    Sensation: Sensation is intact to light touch throughout Motor: Strength is at least antigravity  x4. Tremors: Occasional resting tremor in the right index finger, no postural or action tremors were seen DTR's 1/4 in UE/LE     Montreal Cognitive Assessment  12/02/2014  Visuospatial/ Executive (0/5) 3  Naming (0/3) 3  Attention: Read list of digits (0/2) 2  Attention: Read list of letters (0/1) 1  Attention: Serial 7 subtraction starting at 100 (0/3) 3  Language: Repeat phrase (0/2) 2  Language : Fluency (0/1) 0  Abstraction (0/2) 2  Delayed Recall (0/5) 3  Orientation (0/6) 6  Total 25  Adjusted Score (based on education) 25   MMSE - Mini Mental State Exam 10/28/2020 06/05/2020  Orientation to time 5 5  Orientation to Place 5 2  Registration 3 0  Attention/ Calculation 0 0  Recall 1 0  Language- name 2 objects 2 2  Language- repeat 1 1  Language- follow 3 step command 3 3  Language- read & follow direction 1 1  Write a sentence 1 1  Copy design 1 0  Total score 23 15    No flowsheet data found.     Movement examination: Tone: There is normal tone in the UE/LE Abnormal movements:  no tremor.  No myoclonus.  No asterixis.   Coordination:  There is no decremation with RAM's. Normal finger to nose  Gait and Station: The patient has no difficulty arising out of a deep-seated chair without the use of the hands. The patient's stride length is good.  Gait is cautious and narrow.     Total time spent on today's visit was  30 minutes, including both face-to-face time and nonface-to-face time. Time included that spent on review of records (prior notes available to me/labs/imaging if pertinent), discussing treatment and goals, answering patient's questions and coordinating care.  Cc:  Frederica Kuster, MD Marlowe Kays, PA-C

## 2021-06-28 NOTE — Patient Instructions (Addendum)
°  Start Depakote 125 mg at night, can increase in 1-2 weeks to 2 times a day   5. Recommend increased supervision at home. Needs 24/7 supervision  Will obtain Social Work appt referral  6. Follow-up in 3 months, call for any changes  Drink water !

## 2021-07-01 ENCOUNTER — Institutional Professional Consult (permissible substitution): Payer: Medicare HMO | Admitting: Licensed Clinical Social Worker

## 2021-07-08 ENCOUNTER — Ambulatory Visit: Payer: Medicare HMO | Admitting: Licensed Clinical Social Worker

## 2021-07-08 ENCOUNTER — Other Ambulatory Visit: Payer: Self-pay

## 2021-07-08 DIAGNOSIS — F02818 Dementia in other diseases classified elsewhere, unspecified severity, with other behavioral disturbance: Secondary | ICD-10-CM

## 2021-07-08 NOTE — Patient Instructions (Signed)
Caregiver to review the educational material .  In addition, to follow up with Robyn [name] with DSS regarding special assistance Medicaid for long term care placement.  Also follow up with list that was provided if available spot in a memory care.  Finally if no current availability to follow up with [facility] for comprehensive care for Pt and for family to make on plan on safety and coverage of Pt.

## 2021-07-08 NOTE — BH Specialist Note (Signed)
Integrated Behavioral Health Initial In-Person Visit  MRN: [id number] Name: Robyn [name]  Number of Integrated Behavioral Health Clinician visits: 1- Initial Visit  Session Start time: 1100    Session End time: 1130  Total time in minutes: 30   Types of Service: Collaborative care and Health Promotion  Interpretor:No. Interpretor Name and Language: NA   Warm Hand Off Completed.        Subjective: Robyn [name] is a 87 y.o. female accompanied by  Pt's Daughter  Robyn [name] Patient was referred by Marlowe Kays PA-C for understanding the changes with Pt, provide education on DX and resources in the community. Patient reports the following symptoms/concerns: Decline Cognitively, having hallucination due to DX of LBD.  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 bathing eating by herself but does need reminders  Life Changes: Pt has Dx of LBD and has periods of hallucinations  Patient and/or Family's Strengths/Protective Factors: Social connections  Goals Addressed: Patient will: Reduce symptoms of:  instability, caregiver stress   Increase knowledge and/or ability of: coping skills and Improve quality of life for Pt and increase safety in the home    Demonstrate ability to: Increase healthy adjustment to current life circumstances and Increase adequate support systems for patient/family  Progress towards Goals: Ongoing  Interventions: Interventions utilized: Link to [employer]  Standardized Assessments completed: Not Needed  Patient and/or Family Response: Pt and family open to education provided about  LBD and the need safety precautions.    Patient Centered Plan: Patient is on the following Treatment Plan(s):  To follow up with Medicaid worker and look at Special Assistance Medicaid for Assisted Living.     Assessment: Patient currently experiencing Realization that she is hearing voices and Caregiver concern about safety of Pt.  .   Patient may benefit from Assisted Living facility that can provide the coverage and care needed for Pt.  If Assisted Living is not an option for day center and night coverage of family and or personal CNA care in the Pt's home.    Plan: Follow up with behavioral health clinician on : As needed with any questions or follow up assistance  Behavioral recommendations: For Caregiver to reviewed educational material and follow with resources of special assistance medicaid for long to care placement.  Referral(s):  DSS Lynnell Jude for medicaid follow up.  Assisted living facilities for memory care unit and if needed [facility].   "From scale of 1-10, how likely are you to follow plan?": 10  Jackie Littlejohn A Taylor-Paladino, LCSW

## 2021-07-27 ENCOUNTER — Other Ambulatory Visit: Payer: Self-pay

## 2021-07-27 ENCOUNTER — Ambulatory Visit (INDEPENDENT_AMBULATORY_CARE_PROVIDER_SITE_OTHER): Payer: Medicare HMO | Admitting: Family Medicine

## 2021-07-27 ENCOUNTER — Encounter: Payer: Self-pay | Admitting: Family Medicine

## 2021-07-27 VITALS — BP 136/82 | HR 63 | Temp 95.9°F | Ht 59.0 in | Wt 130.4 lb

## 2021-07-27 DIAGNOSIS — G3183 Dementia with Lewy bodies: Secondary | ICD-10-CM

## 2021-07-27 DIAGNOSIS — E1140 Type 2 diabetes mellitus with diabetic neuropathy, unspecified: Secondary | ICD-10-CM | POA: Diagnosis not present

## 2021-07-27 DIAGNOSIS — Z794 Long term (current) use of insulin: Secondary | ICD-10-CM

## 2021-07-27 DIAGNOSIS — I10 Essential (primary) hypertension: Secondary | ICD-10-CM | POA: Diagnosis not present

## 2021-07-27 DIAGNOSIS — R443 Hallucinations, unspecified: Secondary | ICD-10-CM

## 2021-07-27 DIAGNOSIS — F02818 Dementia in other diseases classified elsewhere, unspecified severity, with other behavioral disturbance: Secondary | ICD-10-CM

## 2021-07-27 NOTE — Progress Notes (Signed)
? ? ?Provider:  ?Jacalyn Lefevre, MD ? ?Careteam: ?Patient Care Team: ?Frederica Kuster, MD as PCP - General (Family Medicine) ?Van Clines, MD as Consulting Physician (Neurology) ?Edwin Cap, DPM as Consulting Physician (Podiatry) ?Elwyn Reach (Neurology) ? ?PLACE OF SERVICE:  ?[facility]  ?Advanced Directive information ?  ? ?No Known Allergies ? ?Chief Complaint  ?Patient presents with  ? Medical Management of Chronic Issues  ?  Patient presents today for a 3 month follow up.  ? Quality Metric Gaps  ?  Urine microalbumin, eye exam, TDAP, zoster, pneumonia  ? ? ? ?HPI: Patient is a 87 y.o. female routine 80-month follow-up for chronic problems including Lewy body dementia, diabetes, and hypertension.  Since her last visit with me she has seen neurology.  There are efforts on her behalf to get her qualified for Medicaid when she reaches the point that she is no longer safe to live by herself and moved to memory care or assisted living.  Medicines have been changed from risperidone to Depakote.  Daughter, who accompanies her today, does not see any difference in these medicines.  She still has hallucinations that tell her what to eat, whether to bathe or not, will whether to go outside etc.  Consultants have said that she probably is not safe to live alone. ? ?Review of Systems:  ?Review of Systems  ?Constitutional:  Positive for weight loss.  ?Respiratory: Negative.    ?Cardiovascular: Negative.   ?Genitourinary: Negative.   ?Musculoskeletal: Negative.   ?Psychiatric/Behavioral:  Positive for memory loss. The patient is nervous/anxious.   ?All other systems reviewed and are negative. ? ?Past Medical History:  ?Diagnosis Date  ? Coronary artery disease   ? Dementia (HCC)   ? Diabetes mellitus   ? Glaucoma   ? Hallucination   ? Heart disease   ? History of heart artery stent 2009  ? Hypertension   ? Memory loss   ? just started on aricept  ? Mild cognitive impairment   ? Osteoarthritis   ? Phobia    ? Psychosis (HCC)   ? Stroke [facility])   ? Vision abnormalities   ? ?Past Surgical History:  ?Procedure Laterality Date  ? CORONARY ANGIOPLASTY WITH STENT PLACEMENT    ? CORONARY STENT PLACEMENT    ? ?Social History: ?  reports that she quit smoking about 57 years ago. Her smoking use included cigarettes. She has never used smokeless tobacco. She reports that she does not drink alcohol and does not use drugs. ? ?Family History  ?Problem Relation Age of Onset  ? Hypertension Mother   ? Diabetes Mother   ? Hypertension Father   ? Diabetes Father   ? Hypertension Sister   ? Diabetes Sister   ? Kidney disease Sister   ? Liver disease Brother   ? Hypertension Daughter   ? Fibromyalgia Daughter   ? Rheum arthritis Daughter   ? Breast cancer Daughter   ? Hypertension Son   ? Seizures Granddaughter   ? Epilepsy Granddaughter   ? ? ?Medications: ?Patient's Medications  ?New Prescriptions  ? No medications on file  ?Previous Medications  ? AMLODIPINE (NORVASC) 10 MG TABLET    Take 10 mg by mouth daily.  ? DIVALPROEX (DEPAKOTE) 125 MG DR TABLET    Take 1 tablet (125 mg total) by mouth at bedtime.  ? NOVOLIN 70/30 RELION (70-30) 100 UNIT/ML INJECTION    25 Units.  ?Modified Medications  ? No medications on  file  ?Discontinued Medications  ? No medications on file  ? ? ?Physical Exam: ? ?Vitals:  ? 07/27/21 1003  ?BP: 136/82  ?Pulse: 63  ?Temp: (!) 95.9 ?F (35.5 ?C)  ?SpO2: 98%  ?Weight: 130 lb 6.4 oz (59.1 kg)  ?Height: 4\' 11"  (1.499 m)  ? ?Body mass index is 26.34 kg/m?. ?Wt Readings from Last 3 Encounters:  ?07/27/21 130 lb 6.4 oz (59.1 kg)  ?06/28/21 133 lb 9.6 oz (60.6 kg)  ?05/27/21 132 lb 3.2 oz (60 kg)  ? ? ?Physical Exam ?Vitals and nursing note reviewed.  ?Constitutional:   ?   Appearance: Normal appearance.  ?Cardiovascular:  ?   Rate and Rhythm: Normal rate and regular rhythm.  ?Pulmonary:  ?   Effort: Pulmonary effort is normal.  ?   Breath sounds: Normal breath sounds.  ?Musculoskeletal:     ?   General: Normal range  of motion.  ?   Comments: Observation of her gait reveals some unsteadiness.  We have encouraged her to use an aid such as cane or walker but she declines  ?Neurological:  ?   General: No focal deficit present.  ?   Mental Status: She is alert.  ?   Comments: Oriented to person and place ?She easily responds appropriately to questions  ?Psychiatric:     ?   Mood and Affect: Mood normal.  ?   Comments: Continues with hallucinations which are very influential in her behaviors and actions  ? ? ?Labs reviewed: ?Basic Metabolic Panel: ?Recent Labs  ?  05/23/21 ?0107  ?NA 136  ?K 4.0  ?CL 108  ?CO2 22  ?GLUCOSE 151*  ?BUN 19  ?CREATININE 1.07*  ?CALCIUM 9.3  ? ?Liver Function Tests: ?No results for input(s): AST, ALT, ALKPHOS, BILITOT, PROT, ALBUMIN in the last 8760 hours. ?No results for input(s): LIPASE, AMYLASE in the last 8760 hours. ?No results for input(s): AMMONIA in the last 8760 hours. ?CBC: ?Recent Labs  ?  05/23/21 ?0107  ?WBC 5.9  ?NEUTROABS 3.3  ?HGB 11.7*  ?HCT 35.6*  ?MCV 89.0  ?PLT 239  ? ?Lipid Panel: ?No results for input(s): CHOL, HDL, LDLCALC, TRIG, CHOLHDL, LDLDIRECT in the last 8760 hours. ?TSH: ?No results for input(s): TSH in the last 8760 hours. ?A1C: ?Lab Results  ?Component Value Date  ? HGBA1C 7.0 (H) 04/27/2021  ? ? ? ?Assessment/Plan ? ?1. Dementia, Lewy body with behavior disturbance (HCC) ?Continue with Depakote.  Important issue is making the decision to move her to assisted living or memory care.  Will depend greatly on Medicaid eligibility ? ?2. Essential hypertension ?Blood pressure is good on amlodipine 10 mg ? ?3. HALLUCINATION ?Result of LBD.  Continue on Depakote ? ?4. Type 2 diabetes mellitus with diabetic neuropathy, with long-term current use of insulin (HCC) ?Gives herself injections of Novolin 70/30, 25 units every morning.  Last A1c was 7.0.  Has eye doctor appointment next month ? ? ?07-11-1969, MD ?[facility] ?[phone]  ? ?

## 2021-08-16 ENCOUNTER — Encounter: Payer: Self-pay | Admitting: Podiatry

## 2021-08-16 ENCOUNTER — Ambulatory Visit (INDEPENDENT_AMBULATORY_CARE_PROVIDER_SITE_OTHER): Payer: Medicare HMO | Admitting: Podiatry

## 2021-08-16 ENCOUNTER — Other Ambulatory Visit: Payer: Self-pay

## 2021-08-16 DIAGNOSIS — M79674 Pain in right toe(s): Secondary | ICD-10-CM

## 2021-08-16 DIAGNOSIS — E1042 Type 1 diabetes mellitus with diabetic polyneuropathy: Secondary | ICD-10-CM

## 2021-08-16 DIAGNOSIS — B351 Tinea unguium: Secondary | ICD-10-CM

## 2021-08-16 DIAGNOSIS — M79675 Pain in left toe(s): Secondary | ICD-10-CM

## 2021-08-16 NOTE — Progress Notes (Signed)
This patient returns to my office for at risk foot care.  This patient requires this care by a professional since this patient will be at risk due to having type 1 diabetes.  This patient is unable to cut nails herself since the patient cannot reach her nails.These nails are painful walking and wearing shoes. She presents to the office with her daughter. This patient presents for at risk foot care today.  General Appearance  Alert, conversant and in no acute stress.  Vascular  Dorsalis pedis and posterior tibial  pulses are  weakly palpable  bilaterally.  Capillary return is within normal limits  bilaterally. Cold feet bilaterally. Absent digital hair  B/L.  Neurologic  Senn-Weinstein monofilament wire test within normal limits  bilaterally. Muscle power within normal limits bilaterally.  Nails Thick disfigured discolored nails with subungual debris  hallux  bilaterally. No evidence of bacterial infection or drainage bilaterally.  Orthopedic  No limitations of motion  feet .  No crepitus or effusions noted.  No bony pathology or digital deformities noted.  Skin  normotropic skin with no porokeratosis noted bilaterally.  No signs of infections or ulcers noted.     Onychomycosis  Pain in right toes  Pain in left toes  Consent was obtained for treatment procedures.   Mechanical debridement of nails 1-5  bilaterally performed with a nail nipper.  Filed with dremel without incident.    Return office visit    10 weeks                  Told patient to return for periodic foot care and evaluation due to potential at risk complications.   Anner Baity DPM   

## 2021-09-06 ENCOUNTER — Ambulatory Visit: Payer: Medicare HMO | Admitting: Neurology

## 2021-09-25 NOTE — Progress Notes (Incomplete)
? ?Assessment/Plan:  ? ? ?Lewy Body Dementia with behavioral disturbance ? ? Recommendations:  ?Discussed safety both in and out of the home.  Walker was recommended again.  She will need 24/7 monitoring at this point ?Referral to Social Work for possible placement  ?Discussed the importance of regular daily schedule to maintain brain function.  ?Continue Depakote 125 mg nightly, may need to be increased accordingly. ?Stay active at least 30 minutes at least 3 times a week.  ?Naps should be scheduled and should be no longer than 60 minutes and should not occur after 2 PM.  ?Follow up in 6 months. ? ? ?Case discussed with Dr. Karel Jarvis who agrees with the plan ? ? ?Subjective:  ? ? ?Robyn [name] is a 87 y.o. right-handed female with a history of hyperlipidemia, diabetes, seen today in follow up for Lewy body dementia. She was first seen on 06/05/2020 at which time MMSE was 15/30. CT of the brain showed disproportionate, odd shaped sulci with an almost entrapped appearance although there is no ventriculomegaly to suggest that this is the result of hydrocephalus. She has mild volume loss and mild areas of hypodensity suggestive of leukokraurosis otherwise.  EEG was normal. Neurocognitive testing 10/28/2020 yielded a diagnosis of Lewy body dementia in view of visual hallucinations, palpitations, parkinsonism, cognitive impairment.  She was last seen on 04/13/2021.  This patient is accompanied in the office by her daughter who supplements the history. ?  Previous records as well as any outside records available were reviewed prior to todays visit.  Patient is not on antidementia medications, as family feels that risks outweigh the benefits.  The main concern is her hallucinations, as she continues to see filiform images, dead relatives, which tell her "not to eat, not to drink, and not to take the medicines ".  The patient lives alone and her daughter is very concerned about this.  She does not have any aides that  stay with her when her daughter cannot, and she is afraid for her safety.  The images are not frightening to her, and she carries conversations with this visual and auditory hallucinations.  Seroquel was initially tried, "it did not help ".  She has not been on any antipsychotic for the last month.  On 05/22/2021, she was seen at the ED  with hallucinations, and hearing voices, "the voices says were telling me to protect myself ".  She even asked the daughter to get a gun permit.  Sometimes she yells at the TV. ? ?Her daughter reports that her "memory comes and goes ".  She denies repeating the same stories or asking the same questions. She does not like to go outside.  At times, the door is closed as she looks at, she does not know how to open it.  She denies leaving objects in unusual places.  She no longer drives.   She reports very vivid dreams when sleeping, but denies sleepwalking.  She reports paranoia "a strange feeling inside, and does not want to be by myself".  No hygiene concerns, she needs mild assistance with bathing and dressing due to mobility issues.  Her daughter is in charge of the finances.  When she eats "if they let me ", denies trouble swallowing. She admits to not drinking enough water.  She does not cook.  She has a walker to ambulate, but does not like to use it.  Denies any falls or head injuries.  She was physical therapy in the past, which  has helped.  She denies any headaches, double vision, dizziness, focal numbness or tingling, unilateral weakness, tremors or anosmia.  No history of seizures.  She has some urine incontinence, does not use pads. She had a UTI during the ER presentations in 12/21.  She denies constipation or diarrhea.  ? ?  ?HISTORY OF PRESENT ILLNESS 06/05/20: ?This is an 87 year old right-handed woman with a history of hypertension, hyperlipidemia, diabetes, dementia, presenting for evaluation of dementia. On her PCP appointment in October 2021, daughter reported she  is often disoriented to place and time, and hears voices telling her what to do. She lives alone and her daughters check on her multiple times per week and help with housework. She was evaluated by neurologist Dr. Epimenio Foot for similar symptoms in 2016. Symptoms had started around 2014. At that time, hallucinations were primarily auditory but sometimes visual. She also reported sleep difficulty with the voices speaking to her when she tries to fall asleep. She had side effects of nausea on Donepezil and was prescribed Memantine and Seroquel. MOCA 26/30 in 11/2014. Etiology of symptoms at that time unclear, she did not have features of LBD or AD. She was started on hydroxyzine then was lost to follow-up. ?  ?Over the years, Robyn [name] reports that the auditory hallucinations have worsened. The voices would tell her to go outside sometimes, as far as [name] knows she has not wandered outside. She reports heaviness in her truncal region, that she has gained weight, Robyn [name] states she thinks the people are causing the heaviness behind her. The voices tell her that her laundry is not worth keeping. When they are walking, she wants to go a different way because the voices are telling her to go that way. She would offer food when she is eating. She goes to the bathroom and would be heard saying leave her alone. She would stand and stare, and when asked what she is doing, she says she is talking to them. Initially she stated she does not hear anything, then Robyn [name] reminds her it occurs all the time, she talks to them and waves while she is at [employer]. She states they are in a group, in her house, and can be scary for her, keeping her up at night. She only gets 1-2 hours of sleep at night and naps in the day. She recalls Seroquel (and maybe hydroxyzine) causing dizziness in the past. She says she took these for a long time, but was lost to follow-up.  ?  ?Robyn [name] thinks her memory is okay. She continues to live alone. She states her memory is  "sometimes not very well." She does not drive. She continues to manage her own medications and finances, they deny any issues. She denies leaving the stove on. She has occasional word-finding difficulties. There is no clear REM behavior disorder. She has occasional hand tremors. She always feel like her balance is off, no falls. She denies any headaches, diplopia, dysarthria/dysphagia, neck pain, focal numbness/tingling/weakness, bowel/bladder dysfunction, anosmia. There is no family history of dementia, no history of significant head injuries or alcohol use.  ?  ?  ?Laboratory Data: ?EKG in 06/2019 showed a QTc of 460 ?  ?I personally reviewed head CTs done over the years, most recently in 06/2019 which showed moderate diffuse atrophy, there appears to be more in the right frontal and occipital regions. ? ?Neurocognitive testing 10/28/2020 Dr. Roseanne Reno Robyn [name] demonstrated impaired performance on measures of processing speed, executive function, and her visuospatial and constructional  functioning fell at an unusually low level, suggesting some mild difficulties. She also had marked naming problems and diminished phonemic as compared to semantic fluency. Memory performance was notable for low encoding of information but she retained verbal information well and does not appear to have a frank storage problem at this time. She did well on measures of attention/working memory. Her CDR places her in the mild dementia range based on the history of provided and her objective test performance, although her daughter characterized her as functioning at a moderate dementia level. It is possible that movement or other issues are confounding her rating or perhaps the patient simply tests well. She did score a 4/4 on the Mayo Fluctuations Questionnaire. ?  ?Robyn [name] is thus demonstrating a dementia level problem with primary deficits on measures of processing speed, executive functioning, and naming and she has some additional  visuospatial/constructional issues. 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 He

## 2021-09-27 ENCOUNTER — Ambulatory Visit: Payer: Medicare HMO | Admitting: Physician Assistant

## 2021-09-28 ENCOUNTER — Encounter: Payer: Self-pay | Admitting: Family Medicine

## 2021-09-28 ENCOUNTER — Ambulatory Visit: Payer: Medicare HMO | Admitting: Family Medicine

## 2021-09-28 ENCOUNTER — Ambulatory Visit (INDEPENDENT_AMBULATORY_CARE_PROVIDER_SITE_OTHER): Payer: Medicare HMO | Admitting: Family Medicine

## 2021-09-28 VITALS — BP 110/68 | HR 66 | Temp 96.0°F

## 2021-09-28 DIAGNOSIS — F02818 Dementia in other diseases classified elsewhere, unspecified severity, with other behavioral disturbance: Secondary | ICD-10-CM

## 2021-09-28 DIAGNOSIS — E1140 Type 2 diabetes mellitus with diabetic neuropathy, unspecified: Secondary | ICD-10-CM | POA: Diagnosis not present

## 2021-09-28 DIAGNOSIS — J301 Allergic rhinitis due to pollen: Secondary | ICD-10-CM | POA: Diagnosis not present

## 2021-09-28 DIAGNOSIS — Z794 Long term (current) use of insulin: Secondary | ICD-10-CM

## 2021-09-28 DIAGNOSIS — G3183 Dementia with Lewy bodies: Secondary | ICD-10-CM | POA: Diagnosis not present

## 2021-09-28 NOTE — Patient Instructions (Signed)
For sinus symptoms, however recommend Flonase once a day and Mucinex (plain) twice daily ?

## 2021-09-28 NOTE — Progress Notes (Signed)
? ? ?Provider:  ?Jacalyn Lefevre, MD ? ?Careteam: ?Patient Care Team: ?Robyn Kuster, MD as PCP - General (Family Medicine) ?Robyn Clines, MD as Consulting Physician (Neurology) ?Robyn [name], DPM as Consulting Physician (Podiatry) ?Robyn [name] (Neurology) ? ?PLACE OF SERVICE:  ?[facility]  ?Advanced Directive information ?  ? ?No Known Allergies ? ?No chief complaint on file. ? ? ? ?HPI: Patient is a 87 y.o. female .  Patient is here today to follow-up Lewy body dementia, diabetes, and hypertension.  In addition today she is complaining of some sinus drainage and hoarseness and cough.  Not attending any fever.  Cough is nonproductive.  She is on no medication for symptomatic relief ?Daughter, who accompanies her today, has a court date in early June to try and get power of attorney.  She is noting some progression of her dementia.  She is scheduled to see neurology next week. ? ?Review of Systems:  ?Review of Systems  ?HENT:  Positive for congestion and sinus pain.   ?Respiratory:  Positive for cough.   ?Neurological: Negative.   ?Psychiatric/Behavioral:  Positive for hallucinations and memory loss.   ?All other systems reviewed and are negative. ? ?Past Medical History:  ?Diagnosis Date  ? Coronary artery disease   ? Dementia (HCC)   ? Diabetes mellitus   ? Glaucoma   ? Hallucination   ? Heart disease   ? History of heart artery stent 2009  ? Hypertension   ? Memory loss   ? just started on aricept  ? Mild cognitive impairment   ? Osteoarthritis   ? Phobia   ? Psychosis (HCC)   ? Stroke [facility])   ? Vision abnormalities   ? ?Past Surgical History:  ?Procedure Laterality Date  ? CORONARY ANGIOPLASTY WITH STENT PLACEMENT    ? CORONARY STENT PLACEMENT    ? ?Social History: ?  reports that she quit smoking about 58 years ago. Her smoking use included cigarettes. She has never used smokeless tobacco. She reports that she does not drink alcohol and does not use drugs. ? ?Family History  ?Problem  Relation Age of Onset  ? Hypertension Mother   ? Diabetes Mother   ? Hypertension Father   ? Diabetes Father   ? Hypertension Sister   ? Diabetes Sister   ? Kidney disease Sister   ? Liver disease Brother   ? Hypertension Daughter   ? Fibromyalgia Daughter   ? Rheum arthritis Daughter   ? Breast cancer Daughter   ? Hypertension Son   ? Seizures Granddaughter   ? Epilepsy Granddaughter   ? ? ?Medications: ?Patient's Medications  ?New Prescriptions  ? No medications on file  ?Previous Medications  ? AMLODIPINE (NORVASC) 10 MG TABLET    Take 10 mg by mouth daily.  ? DIVALPROEX (DEPAKOTE) 125 MG DR TABLET    Take 1 tablet (125 mg total) by mouth at bedtime.  ? NOVOLIN 70/30 RELION (70-30) 100 UNIT/ML INJECTION    25 Units.  ?Modified Medications  ? No medications on file  ?Discontinued Medications  ? No medications on file  ? ? ?Physical Exam: ? ?There were no vitals filed for this visit. ?There is no height or weight on file to calculate BMI. ?Wt Readings from Last 3 Encounters:  ?07/27/21 130 lb 6.4 oz (59.1 kg)  ?06/28/21 133 lb 9.6 oz (60.6 kg)  ?05/27/21 132 lb 3.2 oz (60 kg)  ? ? ?Physical Exam ?Vitals and nursing note reviewed.  ?  Constitutional:   ?   Appearance: Normal appearance.  ?HENT:  ?   Right Ear: Tympanic membrane normal.  ?   Left Ear: Tympanic membrane normal.  ?   Mouth/Throat:  ?   Mouth: Mucous membranes are moist.  ?   Pharynx: Oropharynx is clear.  ?Cardiovascular:  ?   Rate and Rhythm: Normal rate and regular rhythm.  ?Pulmonary:  ?   Effort: Pulmonary effort is normal.  ?   Breath sounds: Normal breath sounds.  ?Neurological:  ?   General: No focal deficit present.  ?   Mental Status: She is alert and oriented to person, place, and time.  ?Psychiatric:     ?   Mood and Affect: Mood normal.  ? ? ?Labs reviewed: ?Basic Metabolic Panel: ?Recent Labs  ?  05/23/21 ?0107  ?NA 136  ?K 4.0  ?CL 108  ?CO2 22  ?GLUCOSE 151*  ?BUN 19  ?CREATININE 1.07*  ?CALCIUM 9.3  ? ?Liver Function Tests: ?No results  for input(s): AST, ALT, ALKPHOS, BILITOT, PROT, ALBUMIN in the last 8760 hours. ?No results for input(s): LIPASE, AMYLASE in the last 8760 hours. ?No results for input(s): AMMONIA in the last 8760 hours. ?CBC: ?Recent Labs  ?  05/23/21 ?0107  ?WBC 5.9  ?NEUTROABS 3.3  ?HGB 11.7*  ?HCT 35.6*  ?MCV 89.0  ?PLT 239  ? ?Lipid Panel: ?No results for input(s): CHOL, HDL, LDLCALC, TRIG, CHOLHDL, LDLDIRECT in the last 8760 hours. ?TSH: ?No results for input(s): TSH in the last 8760 hours. ?A1C: ?Lab Results  ?Component Value Date  ? HGBA1C 7.0 (H) 04/27/2021  ? ? ? ?Assessment/Plan ? ?1. Seasonal allergic rhinitis due to pollen ?We will recommend Flonase and Mucinex for symptomatic relief ? ?2. Dementia, Lewy body with behavior disturbance (HCC) ?Patient continues with Depakote at bedtime ? ?3. Type 2 diabetes mellitus with diabetic neuropathy, with long-term current use of insulin (HCC) ?Last A1c was 7.0 on Novolin 70/30 ? ? ?Jacalyn Lefevre, MD ?[facility] ?[phone]  ? ?

## 2021-09-29 LAB — BASIC METABOLIC PANEL WITH GFR
BUN/Creatinine Ratio: 20 (calc) (ref 6–22)
BUN: 20 mg/dL (ref 7–25)
CO2: 26 mmol/L (ref 20–32)
Calcium: 9.7 mg/dL (ref 8.6–10.4)
Chloride: 103 mmol/L (ref 98–110)
Creat: 1 mg/dL — ABNORMAL HIGH (ref 0.60–0.95)
Glucose, Bld: 144 mg/dL — ABNORMAL HIGH (ref 65–99)
Potassium: 4.5 mmol/L (ref 3.5–5.3)
Sodium: 138 mmol/L (ref 135–146)
eGFR: 55 mL/min/{1.73_m2} — ABNORMAL LOW (ref >=60)

## 2021-09-29 LAB — HEMOGLOBIN A1C
Hgb A1c MFr Bld: 7.8 % of total Hgb — ABNORMAL HIGH (ref <5.7)
Mean Plasma Glucose: 177 mg/dL
eAG (mmol/L): 9.8 mmol/L

## 2021-10-06 ENCOUNTER — Encounter: Payer: Self-pay | Admitting: Physician Assistant

## 2021-10-06 ENCOUNTER — Ambulatory Visit: Payer: Medicare HMO | Admitting: Physician Assistant

## 2021-10-06 VITALS — BP 135/74 | HR 66 | Resp 18 | Ht 61.0 in | Wt 130.0 lb

## 2021-10-06 DIAGNOSIS — G3183 Dementia with Lewy bodies: Secondary | ICD-10-CM

## 2021-10-06 DIAGNOSIS — F02818 Dementia in other diseases classified elsewhere, unspecified severity, with other behavioral disturbance: Secondary | ICD-10-CM

## 2021-10-06 MED ORDER — DIVALPROEX SODIUM 125 MG PO DR TAB: 125.0000 mg | DELAYED_RELEASE_TABLET | Freq: Every evening | ORAL | 3 refills | Status: DC

## 2021-10-06 NOTE — Progress Notes (Signed)
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  Overall, the patient is doing well from the cognitive standpoint.  Behaviorally, her mood is controlled. She is on Depakote 125 mg daily, tolerating well.  As of June 1st 2023, her daughter is to be her guardian.   Recommendations:   Continue Depakote 125 mg daily Follow up in 6 months  Follow-up with behavioral health for mood Referral for assisted living facility for memory care unit and  PACE program has been placed.   Case discussed with Dr. Karel Jarvis who agrees with the plan     Subjective:    Robyn [name] is a very pleasant 87 y.o. RH female  seen today in follow up for memory loss. This patient is accompanied in the office by her daughter who supplements the history.  Previous records as well as any outside records available were reviewed prior to todays visit.  Patient was last seen at our office on 06/28/2021. She is not on antidementia meds at this time.    Any changes in memory since last visit? Denies, her daughter reports that her memory is "good" Patient lives alone repeats oneself? Occasionally Disoriented when walking into a room?  Patient denies   Leaving objects in unusual places?  Patient denies.  She likes to collect masks, and likes to play sending her pocket book.  She also likes to collect plastic bags. Ambulates  with difficulty?   Patient denies   Recent falls?  Patient denies   Any head injuries?  Patient denies   History of seizures?   Patient denies   Wandering behavior?  Patient denies   Patient drives?   Patient no longer drives    Any mood changes such irritability agitation?  Mood is well controlled with Depakote 125 mg daily. Any history of depression?:  Patient denies   Hallucinations?  Patient denies that the daughter reports that she "talks to people".  "Nothing frightening  ". Paranoia?  Patient denies   Patient reports that he sleeps well  with vivid dreams, without REM behavior or sleepwalking    History of sleep apnea?  Patient denies   Any hygiene concerns?  Patient denies   Independent of bathing and dressing?  Endorsed  Does the patient needs help with medications?  Her daughter monitors. Who is in charge of the finances?  Her daughter is in charge.   Any changes in appetite?  Patient denies   Patient have trouble swallowing? Patient denies   Does the patient cook?  Patient denies   Any kitchen accidents such as leaving the stove on? Patient denies   Any headaches?  Patient denies   The double vision? Patient denies   Any focal numbness or tingling?  Patient denies   Chronic back pain Patient denies   Unilateral weakness?  Patient denies   Any tremors?  Patient denies   Any history of anosmia?  Patient denies   Any incontinence of urine?  Patient uses pads for stress incontinence Any bowel dysfunction?   Patient denies           HISTORY OF PRESENT ILLNESS 06/05/20: This is an 87 year old right-handed woman with a history of hypertension, hyperlipidemia, diabetes, dementia, presenting for evaluation of dementia. On her PCP appointment in October 2021, daughter reported she is often disoriented to place and time, and hears voices telling her what to do. She lives  alone and her daughters check on her multiple times per week and help with housework. She was evaluated by neurologist Dr. Epimenio Foot for similar symptoms in 2016. Symptoms had started around 2014. At that time, hallucinations were primarily auditory but sometimes visual. She also reported sleep difficulty with the voices speaking to her when she tries to fall asleep. She had side effects of nausea on Donepezil and was prescribed Memantine and Seroquel. MOCA 26/30 in 11/2014. Etiology of symptoms at that time unclear, she did not have features of LBD or AD. She was started on hydroxyzine then was lost  to follow-up.   Over the years, Robyn [name] reports that the auditory hallucinations have worsened. The voices would tell her to go outside sometimes, as far as Robyn [name] knows she has not wandered outside. She reports heaviness in her truncal region, that she has gained weight, Robyn [name] states she thinks the people are causing the heaviness behind her. The voices tell her that her laundry is not worth keeping. When they are walking, she wants to go a different way because the voices are telling her to go that way. She would offer food when she is eating. She goes to the bathroom and would be heard saying leave her alone. She would stand and stare, and when asked what she is doing, she says she is talking to them. Initially she stated she does not hear anything, then Robyn [name] reminds her it occurs all the time, she talks to them and waves while she is at Robyn [name]. She states they are in a group, in her house, and can be scary for her, keeping her up at night. She only gets 1-2 hours of sleep at night and naps in the day. She recalls Seroquel (and maybe hydroxyzine) causing dizziness in the past. She says she took these for a long time, but was lost to follow-up.    Robyn [name] thinks her memory is okay. She continues to live alone. She states her memory is "sometimes not very well." She does not drive. She continues to manage her own medications and finances, they deny any issues. She denies leaving the stove on. She has occasional word-finding difficulties. There is no clear REM behavior disorder. She has occasional hand tremors. She always feel like her balance is off, no falls. She denies any headaches, diplopia, dysarthria/dysphagia, neck pain, focal numbness/tingling/weakness, bowel/bladder dysfunction, anosmia. There is no family history of dementia, no history of significant head injuries or alcohol use.      Laboratory Data: EKG in 06/2019 showed a QTc of 460   I personally reviewed head CTs done over the years, most recently  in 06/2019 which showed moderate diffuse atrophy, there appears to be more in the right frontal and occipital regions.   Neurocognitive testing 10/28/2020 Dr. Roseanne [name] Robyn [name] demonstrated impaired performance on measures of processing speed, executive function, and her visuospatial and constructional functioning fell at an unusually low level, suggesting some mild difficulties. She also had marked naming problems and diminished phonemic as compared to semantic fluency. Memory performance was notable for low encoding of information but she retained verbal information well and does not appear to have a frank storage problem at this time. She did well on measures of attention/working memory. Her CDR places her in the mild dementia range based on the history of provided and her objective test performance, although her daughter characterized her as functioning at a moderate dementia level. It is possible that movement or other issues are confounding her rating or  perhaps the patient simply tests well. She did score a 4/4 on the Mayo Fluctuations Questionnaire.   Robyn [name] is thus demonstrating a dementia level problem with primary deficits on measures of processing speed, executive functioning, and naming and she has some additional visuospatial/constructional issues. 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 Her test data are weakly supportive although her visuospatial functioning is less affected than might be expected. A mixed pathological picture is also possible, although her test data are equivocal for AD (has naming problems but no memory storage problem).    Diagnostic Impressions: Lewy Body Dementia with behavioral disturbance   PREVIOUS MEDICATIONS:   CURRENT MEDICATIONS:  Outpatient Encounter Medications as of 10/06/2021  Medication Sig   amLODipine (NORVASC) 10 MG tablet  Take 10 mg by mouth daily.   divalproex (DEPAKOTE) 125 MG DR tablet Take 1 tablet (125 mg total) by mouth at bedtime.   NOVOLIN 70/30 RELION (70-30) 100 UNIT/ML injection 25 Units.   No facility-administered encounter medications on file as of 10/06/2021.       10/28/2020    3:00 PM 06/05/2020   10:00 AM  MMSE - Mini Mental State Exam  Orientation to time 5 5  Orientation to Place 5 2  Registration 3 0  Attention/ Calculation 0 0  Recall 1 0  Language- name 2 objects 2 2  Language- repeat 1 1  Language- follow 3 step command 3 3  Language- read & follow direction 1 1  Write a sentence 1 1  Copy design 1 0  Total score 23 15      12/02/2014   10:00 AM  Montreal Cognitive Assessment   Visuospatial/ Executive (0/5) 3  Naming (0/3) 3  Attention: Read list of digits (0/2) 2  Attention: Read list of letters (0/1) 1  Attention: Serial 7 subtraction starting at 100 (0/3) 3  Language: Repeat phrase (0/2) 2  Language : Fluency (0/1) 0  Abstraction (0/2) 2  Delayed Recall (0/5) 3  Orientation (0/6) 6  Total 25  Adjusted Score (based on education) 25    Objective:     PHYSICAL EXAMINATION:    VITALS:   Vitals:   10/06/21 1430  BP: 135/74  Pulse: 66  Resp: 18  SpO2: 98%  Weight: 130 lb (59 kg)  Height: 5\' 1"  (1.549 m)    GEN:  The patient appears stated age and is in NAD. HEENT:  Normocephalic, atraumatic.   Neurological examination:  General: NAD, well-groomed, appears stated age. Orientation: The patient is alert. Oriented to person, place and date. Able to spell WORLD backwards. Delayed recall 2/3 Cranial nerves: There is good facial symmetry.The speech is fluent and clear. No aphasia or dysarthria. Fund of knowledge is appropriate. Recent memory impaired, remote memory normal.  Attention and concentration are normal.  Able to name objects and repeat phrases.  Hearing is intact to conversational tone.    Sensation: Sensation is intact to light touch  throughout Motor: Strength is at least antigravity x4. Tremors: none  DTR's 2/4 in UE/LE     Movement examination: Tone: There is normal tone in the UE/LE Abnormal movements:  no tremor.  No myoclonus.  No asterixis.   Coordination:  There is no decremation with RAM's. Normal finger to nose  Gait and Station: The patient has no difficulty arising out of a deep-seated chair without the use of the hands. The patient's stride length  is good.  Gait is cautious and narrow.   Thank you for allowing Robyn [name] the opportunity to participate in the care of this nice patient. Please do not hesitate to contact Robyn [name] for any questions or concerns.   Total time spent on today's visit was  minutes dedicated to this patient today, preparing to see patient, examining the patient, ordering tests and/or medications and counseling the patient, documenting clinical information in the EHR or other health record, independently interpreting results and communicating results to the patient/family, discussing treatment and goals, answering patient's questions and coordinating care.  Cc:  Frederica KusterMiller, Stephen M, MD  Marlowe KaysSara Sabrena Gavitt 10/07/2021 7:32 AM    Cc:  Frederica KusterMiller, Stephen M, MD Marlowe KaysSara Joslynne Klatt, PA-C

## 2021-10-06 NOTE — Patient Instructions (Addendum)
It was a pleasure to see you today at our office.  ? ?Recommendations: ? ?Follow up in  6 months ?Depakote 125 mg daily ? ?  ? ?Whom to call: ? ?Memory  decline, memory medications: Call out office [phone]  ? ?For psychiatric meds, mood meds: Please have your primary care physician manage these medications.  ? ?Counseling regarding caregiver distress, including caregiver depression, anxiety and issues regarding community resources, adult day care programs, adult living facilities, or memory care questions:   Feel free to contact Misty Lisabeth Register, Social Worker at [phone] ?  ?For assessment of decision of mental capacity and competency:  Call Dr. Erick Blinks, geriatric psychiatrist at [phone] ? ?For guidance in geriatric dementia issues please call [facility] [phone] ? ? ?If you have any severe symptoms of a stroke, or other severe issues such as confusion,severe chills or fever, etc call 911 or go to the ER as you may need to be evaluate further ? ? ?Feel free to visit Facebook page " [employer]" for tips of how to care for people with memory problems.  ?  ? ? ?RECOMMENDATIONS FOR ALL PATIENTS WITH MEMORY PROBLEMS: ?1. Continue to exercise (Recommend 30 minutes of walking everyday, or 3 hours every week) ?2. Increase social interactions - continue going to [place] and enjoy social gatherings with friends and family ?3. Eat healthy, avoid fried foods and eat more fruits and vegetables ?4. Maintain adequate blood pressure, blood sugar, and blood cholesterol level. Reducing the risk of stroke and cardiovascular disease also helps promoting better memory. ?5. Avoid stressful situations. Live a simple life and avoid aggravations. Organize your time and prepare for the next day in anticipation. ?6. Sleep well, avoid any interruptions of sleep and avoid any distractions in the bedroom that may interfere with adequate sleep quality ?7. Avoid sugar, avoid sweets as there is a strong link  between excessive sugar intake, diabetes, and cognitive impairment ?We discussed the Mediterranean diet, which has been shown to help patients reduce the risk of progressive memory disorders and reduces cardiovascular risk. This includes eating fish, eat fruits and green leafy vegetables, nuts like almonds and hazelnuts, walnuts, and also use olive oil. Avoid fast foods and fried foods as much as possible. Avoid sweets and sugar as sugar use has been linked to worsening of memory function. ? ?There is always a concern of gradual progression of memory problems. If this is the case, then we may need to adjust level of care according to patient needs. Support, both to the patient and caregiver, should then be put into place.  ? ? ?The Alzheimer?s Association is here all day, every day for people facing Alzheimer?s disease through our free 24/7 Helpline: [phone]. The Helpline provides reliable information and support to all those who need assistance, such as individuals living with memory loss, Alzheimer's or other dementia, caregivers, health care professionals and the public.  ?Our highly trained and knowledgeable staff can help you with: ?Understanding memory loss, dementia and Alzheimer's  ?Medications and other treatment options  ?General information about aging and brain health  ?Skills to provide quality care and to find the best care from professionals  ?Legal, financial and living-arrangement decisions ?Our Helpline also features: ?Confidential care consultation provided by master's level clinicians who can help with decision-making support, crisis assistance and education on issues families face every day  ?Help in a caller's preferred language using our translation service that features more than 200 languages and dialects  ?Referrals to  local community programs, services and ongoing support ? ? ? ? ?FALL PRECAUTIONS: Be cautious when walking. Scan the area for obstacles that may increase the risk of  trips and falls. When getting up in the mornings, sit up at the edge of the bed for a few minutes before getting out of bed. Consider elevating the bed at the head end to avoid drop of blood pressure when getting up. Walk always in a well-lit room (use night lights in the walls). Avoid area rugs or power cords from appliances in the middle of the walkways. Use a walker or a cane if necessary and consider physical therapy for balance exercise. Get your eyesight checked regularly. ? ?FINANCIAL OVERSIGHT: Supervision, especially oversight when making financial decisions or transactions is also recommended. ? ?HOME SAFETY: Consider the safety of the kitchen when operating appliances like stoves, microwave oven, and blender. Consider having supervision and share cooking responsibilities until no longer able to participate in those. Accidents with firearms and other hazards in the house should be identified and addressed as well. ? ? ?ABILITY TO BE LEFT ALONE: If patient is unable to contact 911 operator, consider using Life[facility], or when the need is there, arrange for someone to stay with patients. Smoking is a fire hazard, consider supervision or cessation. Risk of wandering should be assessed by caregiver and if detected at any point, supervision and safe proof recommendations should be instituted. ? ?MEDICATION SUPERVISION: Inability to self-administer medication needs to be constantly addressed. Implement a mechanism to ensure safe administration of the medications. ? ? ?DRIVING: Regarding driving, in patients with progressive memory problems, driving will be impaired. We advise to have someone else do the driving if trouble finding directions or if minor accidents are reported. Independent driving assessment is available to determine safety of driving. ? ? ?If you are interested in the driving assessment, you can contact the following: ? ?[employer] in [place] [phone] ? ?[facility] [phone] ? ?[facility] [phone] ? ?[facility] [phone] or [phone] ? ?  ? ? ?Mediterranean Diet ?A Mediterranean diet refers to food and lifestyle choices that are based on the traditions of countries located on the [place]. This way of eating has been shown to help prevent certain conditions and improve outcomes for people who have chronic diseases, like kidney disease and heart disease. ?What are tips for following this plan? ?Lifestyle  ?Cook and eat meals together with your family, when possible. ?Drink enough fluid to keep your urine clear or pale yellow. ?Be physically active every day. This includes: ?Aerobic exercise like running or swimming. ?Leisure activities like gardening, walking, or housework. ?Get 7-8 hours of sleep each night. ?If recommended by your health care provider, drink red wine in moderation. This means 1 glass a day for nonpregnant women and 2 glasses a day for men. A glass of wine equals 5 oz (150 mL). ?Reading food labels  ?Check the serving size of packaged foods. For foods such as rice and pasta, the serving size refers to the amount of cooked product, not dry. ?Check the total fat in packaged foods. Avoid foods that have saturated fat or trans fats. ?Check the ingredients list for added sugars, such as corn syrup. ?Shopping  ?At the grocery store, buy most of your food from the areas near the walls of the store. This includes: ?Fresh fruits and vegetables (produce). ?Grains, beans, nuts, and seeds. Some of these may be available in unpackaged forms or large  amounts (in bulk). ?Fresh seafood. ?Poultry and eggs. ?Low-fat dairy products. ?Buy whole ingredients instead of prepackaged foods. ?Buy fresh fruits and vegetables in-season from local farmers markets. ?Buy frozen fruits and vegetables in resealable bags. ?If you do not have access to quality fresh seafood, buy precooked frozen shrimp or canned fish, such as tuna, salmon, or  sardines. ?Buy small amounts of raw or cooked vegetables, salads, or olives from the deli or salad bar at your store. ?Stock your pantry so you always have certain foods on hand, such as olive oil, canned tuna,

## 2021-10-11 ENCOUNTER — Ambulatory Visit: Payer: Medicare HMO | Admitting: Physician Assistant

## 2021-10-20 ENCOUNTER — Telehealth: Payer: Self-pay | Admitting: *Deleted

## 2021-10-20 ENCOUNTER — Encounter: Payer: Self-pay | Admitting: Family Medicine

## 2021-10-20 ENCOUNTER — Encounter: Payer: Self-pay | Admitting: *Deleted

## 2021-10-20 NOTE — Telephone Encounter (Signed)
Patient daughter, [name] called and stated that patient has a Proceeding the Lawyer tomorrow and the [employer] is requesting a letter from Dr. Hyacinth Meeker regarding patient's Mental state and competency.   Stated that she will be by here in the morning to pick up that letter. Stated that she told Dr. Hyacinth Meeker at patient's appointment on 09/28/2021 that she needed this letter.   Please Advise.

## 2021-10-21 ENCOUNTER — Encounter: Payer: Self-pay | Admitting: Family Medicine

## 2021-10-21 NOTE — Telephone Encounter (Signed)
Letter written and signed and placed up front for pick up. Daughter will pick up around 9am 10/21/2021.

## 2021-10-21 NOTE — Progress Notes (Signed)
  To whom it may concern  Re: [name]        DOB: [birth year]  This letter will serve to document the mental status and competency of Robyn [name].  Historically, this patient began having cognitive changes dating back to 2014.  She is currently being seen by our office, [facility], as well as offices of neurology.  Her problem relates to a diagnosis of Lewy body dementia.  With this disease, she has intermittent disorientation and auditory hallucinations.  Providers in both offices have recommended she requires monitoring, 24/7.  She is not able to live alone.  Hallucinations are the source and reason of her greatest problem.  She endorses and has been said to see and hear dead relatives, who advised her to engage in unsafe behaviors such as not taking her medicines and not eating.  She has also ask about acquiring a gun according to this advice from dead relatives.  The purpose of the gun is to protect herself.  She has had formal cognitive testing such as the MMSE which is a 30 point standardized screening tool that assesses orientation, attention, short-term recall, naming, repetition, and simple verbal and written commands.  She scored 15/30.  Scores below 23 have proved to be sensitive for the diagnosis of dementia.  If I can be of further service, please do not hesitate to call.  Sincerely,  Bertram Millard. Hyacinth Meeker, MD, mph

## 2021-11-09 ENCOUNTER — Ambulatory Visit: Payer: Medicare HMO | Admitting: Family Medicine

## 2021-11-15 ENCOUNTER — Encounter: Payer: Self-pay | Admitting: Family

## 2021-11-15 ENCOUNTER — Ambulatory Visit (INDEPENDENT_AMBULATORY_CARE_PROVIDER_SITE_OTHER): Payer: Medicare HMO | Admitting: Family

## 2021-11-15 VITALS — BP 104/60 | HR 61 | Temp 96.9°F | Resp 16 | Ht 61.0 in | Wt 131.0 lb

## 2021-11-15 DIAGNOSIS — H6123 Impacted cerumen, bilateral: Secondary | ICD-10-CM

## 2021-11-15 DIAGNOSIS — R220 Localized swelling, mass and lump, head: Secondary | ICD-10-CM

## 2021-11-15 NOTE — Progress Notes (Signed)
Provider: Shantanu Strauch FNP-C  Frederica Kuster, MD  Patient Care Team: Frederica Kuster, MD as PCP - General (Family Medicine) Van Clines, MD as Consulting Physician (Neurology) Edwin Cap, DPM as Consulting Physician (Podiatry) Gwynneth Munson Corrie Dandy (Neurology)  Extended Emergency Contact Information Primary Emergency Contact: Earlie Server, [place] Home Phone: [phone] Relation: Daughter Secondary Emergency Contact: Lovie Chol, [place] Home Phone: [phone] Relation: Daughter  Code Status:  DNR Goals of care: Advanced Directive information    11/15/2021    2:57 PM  Advanced Directives  Does Patient Have a Medical Advance Directive? No  Would patient like information on creating a medical advance directive? No - Patient declined     Chief Complaint  Patient presents with   Acute Visit    Patient is here for swelling of the jaw and light headedness.     HPI:  Pt is a 87 y.o. female seen today for an acute visit for evaluation of swelling of the jaw and lightheadedness x 3 days.She denies any toothache or abscess.She does wear dentures denies any fever or chills.She is here with her daughter who also thinks that her jaws are swollen on both sides. B/p in the 130's she denies any lightheadedness or dizziness during visit   Past Medical History:  Diagnosis Date   Coronary artery disease    Dementia (HCC)    Diabetes mellitus    Glaucoma    Hallucination    Heart disease    History of heart artery stent 2009   Hypertension    Memory loss    just started on aricept   Mild cognitive impairment    Osteoarthritis    Phobia    Psychosis (HCC)    Stroke (HCC)    Vision abnormalities    Past Surgical History:  Procedure Laterality Date   CORONARY ANGIOPLASTY WITH STENT PLACEMENT     CORONARY STENT PLACEMENT      No Known Allergies  Outpatient Encounter  Medications as of 11/15/2021  Medication Sig   amLODipine (NORVASC) 10 MG tablet Take 10 mg by mouth daily.   divalproex (DEPAKOTE) 125 MG DR tablet Take 1 tablet (125 mg total) by mouth at bedtime.   NOVOLIN 70/30 RELION (70-30) 100 UNIT/ML injection 25 Units.   No facility-administered encounter medications on file as of 11/15/2021.    Review of Systems  Constitutional:  Negative for appetite change, chills, fatigue, fever and unexpected weight change.  HENT:  Negative for congestion, ear discharge, ear pain, facial swelling, hearing loss, nosebleeds, postnasal drip, rhinorrhea, sinus pressure, sinus pain, sneezing, sore throat, tinnitus and trouble swallowing.        Wears dentures  Eyes:  Negative for pain, discharge, redness, itching and visual disturbance.  Respiratory:  Negative for cough, chest tightness, shortness of breath and wheezing.   Cardiovascular:  Negative for chest pain, palpitations and leg swelling.  Gastrointestinal:  Negative for abdominal distention, abdominal pain, constipation, diarrhea, nausea and vomiting.  Musculoskeletal:  Positive for gait problem. Negative for arthralgias, back pain, joint swelling, myalgias, neck pain and neck stiffness.  Skin:  Negative for color change, pallor, rash and wound.  Neurological:  Negative for dizziness, syncope, speech difficulty, weakness, light-headedness, numbness and headaches.    Immunization History  Administered Date(s) Administered   Fluad Quad(high Dose 65+) 04/27/2021  PFIZER Comirnaty(Gray Top)Covid-19 Tri-Sucrose Vaccine 10/12/2020   PFIZER(Purple Top)SARS-COV-2 Vaccination 09/05/2019, 09/30/2019   Pfizer Covid-19 Vaccine Bivalent Booster 24yrs & up 04/14/2021   Pertinent  Health Maintenance Due  Topic Date Due   OPHTHALMOLOGY EXAM  Never done   URINE MICROALBUMIN  Never done   FOOT EXAM  11/18/2021   INFLUENZA VACCINE  12/21/2021   HEMOGLOBIN A1C  03/31/2022   DEXA SCAN  Completed      05/27/2021     2:21 PM 06/28/2021    1:09 PM 07/27/2021   10:14 AM 10/06/2021    2:31 PM 11/15/2021    2:58 PM  Fall Risk  Falls in the past year? 0 0 0 0 0  Was there an injury with Fall? 0 0 0 0 0  Fall Risk Category Calculator 0 0 0 0 0  Fall Risk Category Low Low Low Low Low  Patient Fall Risk Level Low fall risk Moderate fall risk Low fall risk Low fall risk   Patient at Risk for Falls Due to No Fall Risks  No Fall Risks  No Fall Risks  Fall risk Follow up Falls evaluation completed  Falls evaluation completed;Education provided;Falls prevention discussed  Falls evaluation completed   Functional Status Survey:    Vitals:   11/15/21 1511  BP: 104/60  Pulse: 61  Resp: 16  Temp: (!) 96.9 F (36.1 C)  TempSrc: Temporal  SpO2: 98%  Weight: 131 lb (59.4 kg)  Height: 5\' 1"  (1.549 m)   Body mass index is 24.75 kg/m. Physical Exam Vitals reviewed.  Constitutional:      General: She is not in acute distress.    Appearance: Normal appearance. She is normal weight. She is not ill-appearing or diaphoretic.  HENT:     Head: Normocephalic.     Comments: No swelling or tenderness noted on the bilateral jaw     Right Ear: There is impacted cerumen.     Left Ear: There is impacted cerumen.     Ears:     Comments: TM not visualized due to cerumen impaction on both ears    Nose: Nose normal. No congestion or rhinorrhea.     Mouth/Throat:     Mouth: Mucous membranes are moist.     Pharynx: Oropharynx is clear. No oropharyngeal exudate or posterior oropharyngeal erythema.  Eyes:     General: No scleral icterus.       Right eye: No discharge.        Left eye: No discharge.     Extraocular Movements: Extraocular movements intact.     Conjunctiva/sclera: Conjunctivae normal.     Pupils: Pupils are equal, round, and reactive to light.  Neck:     Vascular: No carotid bruit.  Cardiovascular:     Rate and Rhythm: Normal rate and regular rhythm.     Pulses: Normal pulses.     Heart sounds: Normal heart  sounds. No murmur heard.    No friction rub. No gallop.  Pulmonary:     Effort: Pulmonary effort is normal. No respiratory distress.     Breath sounds: Normal breath sounds. No wheezing, rhonchi or rales.  Chest:     Chest wall: No tenderness.  Abdominal:     General: Bowel sounds are normal. There is no distension.     Palpations: Abdomen is soft. There is no mass.     Tenderness: There is no abdominal tenderness. There is no right CVA tenderness, left CVA tenderness, guarding or rebound.  Musculoskeletal:  General: No swelling or tenderness. Normal range of motion.     Cervical back: Normal range of motion. No rigidity or tenderness.     Right lower leg: No edema.     Left lower leg: No edema.  Lymphadenopathy:     Cervical: No cervical adenopathy.  Skin:    General: Skin is warm and dry.     Coloration: Skin is not pale.     Findings: No bruising, erythema, lesion or rash.  Neurological:     Mental Status: She is alert. Mental status is at baseline.     Cranial Nerves: No cranial nerve deficit.     Sensory: No sensory deficit.     Motor: No weakness.     Coordination: Coordination normal.     Gait: Gait abnormal.  Psychiatric:        Mood and Affect: Mood normal.        Speech: Speech normal.        Behavior: Behavior normal.     Labs reviewed: Recent Labs    05/23/21 0107 09/28/21 0954  NA 136 138  K 4.0 4.5  CL 108 103  CO2 22 26  GLUCOSE 151* 144*  BUN 19 20  CREATININE 1.07* 1.00*  CALCIUM 9.3 9.7   No results for input(s): "AST", "ALT", "ALKPHOS", "BILITOT", "PROT", "ALBUMIN" in the last 8760 hours. Recent Labs    05/23/21 0107  WBC 5.9  NEUTROABS 3.3  HGB 11.7*  HCT 35.6*  MCV 89.0  PLT 239   No results found for: "TSH" Lab Results  Component Value Date   HGBA1C 7.8 (H) 09/28/2021   No results found for: "CHOL", "HDL", "LDLCALC", "LDLDIRECT", "TRIG", "CHOLHDL"  Significant Diagnostic Results in last 30 days:  No results  found.  Assessment/Plan 1. Bilateral impacted cerumen Bilateral TM not visualized due to cerumen impaction. We will refer to ENT for further evaluation and removal of cerumen. - Ambulatory referral to ENT  2. Jaw swelling No swelling or tenderness noted on exam.  Will refer to ENT for further evaluation - Ambulatory referral to ENT  Family/ staff Communication: Reviewed plan of care with patient and daughter verbalized understanding  Labs/tests ordered: None   Next Appointment: Return if symptoms worsen or fail to improve.   Caesar Bookman, NP

## 2021-11-17 ENCOUNTER — Ambulatory Visit (INDEPENDENT_AMBULATORY_CARE_PROVIDER_SITE_OTHER): Payer: Medicare HMO | Admitting: Podiatry

## 2021-11-17 ENCOUNTER — Encounter: Payer: Self-pay | Admitting: Podiatry

## 2021-11-17 DIAGNOSIS — L89609 Pressure ulcer of unspecified heel, unspecified stage: Secondary | ICD-10-CM | POA: Insufficient documentation

## 2021-11-17 DIAGNOSIS — E1042 Type 1 diabetes mellitus with diabetic polyneuropathy: Secondary | ICD-10-CM | POA: Diagnosis not present

## 2021-11-17 DIAGNOSIS — B351 Tinea unguium: Secondary | ICD-10-CM | POA: Diagnosis not present

## 2021-11-17 DIAGNOSIS — M79675 Pain in left toe(s): Secondary | ICD-10-CM | POA: Diagnosis not present

## 2021-11-17 DIAGNOSIS — L89619 Pressure ulcer of right heel, unspecified stage: Secondary | ICD-10-CM | POA: Diagnosis not present

## 2021-11-17 DIAGNOSIS — M79674 Pain in right toe(s): Secondary | ICD-10-CM | POA: Diagnosis not present

## 2021-11-17 NOTE — Progress Notes (Signed)
This patient returns to my office for at risk foot care.  This patient requires this care by a professional since this patient will be at risk due to having type 1 diabetes.  This patient is unable to cut nails herself since the patient cannot reach her nails.These nails are painful walking and wearing shoes. She presents to the office with her daughter.   Her daughter points out sore which has developed on the outside of her right heelThis patient presents for at risk foot care today.  General Appearance  Alert, conversant and in no acute stress.  Vascular  Dorsalis pedis and posterior tibial  pulses are  weakly palpable  bilaterally.  Capillary return is within normal limits  bilaterally. Cold feet bilaterally. Absent digital hair  B/L.  Neurologic  Senn-Weinstein monofilament wire test within normal limits  bilaterally. Muscle power within normal limits bilaterally.  Nails Thick disfigured discolored nails with subungual debris  hallux  bilaterally. No evidence of bacterial infection or drainage bilaterally.  Orthopedic  No limitations of motion  feet .  No crepitus or effusions noted.  No bony pathology or digital deformities noted.  Skin  normotropic skin with no porokeratosis noted bilaterally.  No signs of infections or ulcers noted.     Onychomycosis  Pain in right toes  Pain in left toes  Consent was obtained for treatment procedures.   Mechanical debridement of nails 1-5  bilaterally performed with a nail nipper.  Filed with dremel without incident. Debride pressure sore with dremel tool.  Told her to use pillows at night.   Return office visit    10 weeks                  Told patient to return for periodic foot care and evaluation due to potential at risk complications.   Helane Gunther DPM

## 2021-12-03 ENCOUNTER — Other Ambulatory Visit: Payer: Self-pay

## 2021-12-03 MED ORDER — AMLODIPINE BESYLATE 10 MG PO TABS: 10.0000 mg | ORAL_TABLET | Freq: Every day | ORAL | 2 refills | Status: DC

## 2021-12-08 ENCOUNTER — Ambulatory Visit: Payer: Medicare HMO | Admitting: Family Medicine

## 2021-12-29 ENCOUNTER — Ambulatory Visit (INDEPENDENT_AMBULATORY_CARE_PROVIDER_SITE_OTHER): Payer: Medicare HMO | Admitting: Family Medicine

## 2021-12-29 ENCOUNTER — Encounter: Payer: Self-pay | Admitting: Family Medicine

## 2021-12-29 VITALS — BP 128/72 | HR 63 | Temp 97.7°F | Ht 61.0 in | Wt 129.4 lb

## 2021-12-29 DIAGNOSIS — G3183 Dementia with Lewy bodies: Secondary | ICD-10-CM | POA: Diagnosis not present

## 2021-12-29 DIAGNOSIS — R443 Hallucinations, unspecified: Secondary | ICD-10-CM | POA: Diagnosis not present

## 2021-12-29 DIAGNOSIS — Z794 Long term (current) use of insulin: Secondary | ICD-10-CM

## 2021-12-29 DIAGNOSIS — E1140 Type 2 diabetes mellitus with diabetic neuropathy, unspecified: Secondary | ICD-10-CM | POA: Diagnosis not present

## 2021-12-29 DIAGNOSIS — F02818 Dementia in other diseases classified elsewhere, unspecified severity, with other behavioral disturbance: Secondary | ICD-10-CM | POA: Diagnosis not present

## 2021-12-29 DIAGNOSIS — I10 Essential (primary) hypertension: Secondary | ICD-10-CM | POA: Diagnosis not present

## 2021-12-29 NOTE — Patient Instructions (Signed)
Use you walker to prevent falls

## 2021-12-29 NOTE — Progress Notes (Signed)
Provider:  Jacalyn Lefevre, MD  Careteam: Patient Care Team: Frederica Kuster, MD as PCP - General (Family Medicine) Van Clines, MD as Consulting Physician (Neurology) Lilian Kapur Rachelle Hora, DPM as Consulting Physician (Podiatry) Gwynneth Munson, Corrie Dandy (Neurology)  PLACE OF SERVICE:  [facility]  Advanced Directive information    No Known Allergies  Chief Complaint  Patient presents with   Medical Management of Chronic Issues    Patient presents today for a 5 month follow-up   Quality Metric Gaps    Foot & eye exam, urine microalbumin, TDAP, zoster, pneumonia     HPI: Patient is a 87 y.o. Robyn [name] . Patient is here to follow-up dementia hypertension and diabetes.  She is accompanied by her daughter today who tells me that she thinks her mother's memory is worse.  She continues to have some voices speaking to her.  She has been diagnosed with Lewy body dementia and is also followed by neurology.  She continues to live alone and does minimal cooking.  There have been no falls but the daughter and the patient say that her gait is unstable.  She is resistant to using a walker which all interested parties have recommended to her.  Review of Systems:  Review of Systems  Constitutional: Negative.   HENT: Negative.    Eyes: Negative.   Respiratory: Negative.    Cardiovascular: Negative.   Gastrointestinal: Negative.   Genitourinary: Negative.   Musculoskeletal: Negative.   Skin: Negative.   Neurological: Negative.   Psychiatric/Behavioral:  Positive for hallucinations and memory loss.   All other systems reviewed and are negative.   Past Medical History:  Diagnosis Date   Coronary artery disease    Dementia (HCC)    Diabetes mellitus    Glaucoma    Hallucination    Heart disease    History of heart artery stent 2009   Hypertension    Memory loss    just started on aricept   Mild cognitive impairment    Osteoarthritis    Phobia    Psychosis (HCC)    Stroke (HCC)     Vision abnormalities    Past Surgical History:  Procedure Laterality Date   CORONARY ANGIOPLASTY WITH STENT PLACEMENT     CORONARY STENT PLACEMENT     Social History:   reports that she quit smoking about 58 years ago. Her smoking use included cigarettes. She has never used smokeless tobacco. She reports that she does not drink alcohol and does not use drugs.  Family History  Problem Relation Age of Onset   Hypertension Mother    Diabetes Mother    Hypertension Father    Diabetes Father    Hypertension Sister    Diabetes Sister    Kidney disease Sister    Liver disease Brother    Hypertension Daughter    Fibromyalgia Daughter    Rheum arthritis Daughter    Breast cancer Daughter    Hypertension Son    Seizures Granddaughter    Epilepsy Granddaughter     Medications: Patient's Medications  New Prescriptions   No medications on file  Previous Medications   AMLODIPINE (NORVASC) 10 MG TABLET    Take 1 tablet (10 mg total) by mouth daily.   DIVALPROEX (DEPAKOTE) 125 MG DR TABLET    Take 1 tablet (125 mg total) by mouth at bedtime.   NOVOLIN 70/30 RELION (70-30) 100 UNIT/ML INJECTION    25 Units.  Modified Medications   No medications  on file  Discontinued Medications   No medications on file    Physical Exam:  Vitals:   12/29/21 0958  BP: 128/72  Pulse: 63  Temp: 97.7 F (36.5 C)  SpO2: 98%  Weight: 129 lb 6.4 oz (58.7 kg)  Height: 5\' 1"  (1.549 m)   Body mass index is 24.45 kg/m. Wt Readings from Last 3 Encounters:  12/29/21 129 lb 6.4 oz (58.7 kg)  11/15/21 131 lb (59.4 kg)  10/06/21 130 lb (59 kg)    Physical Exam Vitals and nursing note reviewed.  Constitutional:      Appearance: Normal appearance.  Cardiovascular:     Rate and Rhythm: Normal rate and regular rhythm.  Pulmonary:     Effort: Pulmonary effort is normal.     Breath sounds: Normal breath sounds.  Musculoskeletal:     Comments: Gait is unsure and unsteady  Neurological:      General: No focal deficit present.     Mental Status: She is alert and oriented to person, place, and time.     Labs reviewed: Basic Metabolic Panel: Recent Labs    05/23/21 0107 09/28/21 0954  NA 136 138  K 4.0 4.5  CL 108 103  CO2 22 26  GLUCOSE 151* 144*  BUN 19 20  CREATININE 1.07* 1.00*  CALCIUM 9.3 9.7   Liver Function Tests: No results for input(s): "AST", "ALT", "ALKPHOS", "BILITOT", "PROT", "ALBUMIN" in the last 8760 hours. No results for input(s): "LIPASE", "AMYLASE" in the last 8760 hours. No results for input(s): "AMMONIA" in the last 8760 hours. CBC: Recent Labs    05/23/21 0107  WBC 5.9  NEUTROABS 3.3  HGB 11.7*  HCT 35.6*  MCV 89.0  PLT 239   Lipid Panel: No results for input(s): "CHOL", "HDL", "LDLCALC", "TRIG", "CHOLHDL", "LDLDIRECT" in the last 8760 hours. TSH: No results for input(s): "TSH" in the last 8760 hours. A1C: Lab Results  Component Value Date   HGBA1C 7.8 (H) 09/28/2021     Assessment/Plan 1. Type 2 diabetes mellitus with diabetic neuropathy, with long-term current use of insulin (HCC) She is currently on Novolin 70/30 25 units.  She tells me that she remembers to take her insulin by there is some question regarding this from her daughter.  Plan A1c today to reassess  2. Dementia, Lewy body with behavior disturbance (HCC) There is some progression of memory loss and hallucinations per the daughter but things really seem to be stable from my perspective  3. Essential hypertension Blood pressure is good today at 128/72.  She takes amlodipine 10 mg  4. HALLUCINATION  Taking Depakote 125 mg at bedtime.  That appears to be effective 07-11-1969, MD [facility] [phone]

## 2021-12-30 LAB — HEMOGLOBIN A1C
Hgb A1c MFr Bld: 7.3 % of total Hgb — ABNORMAL HIGH (ref <5.7)
Mean Plasma Glucose: 163 mg/dL
eAG (mmol/L): 9 mmol/L

## 2022-02-16 ENCOUNTER — Encounter: Payer: Self-pay | Admitting: Podiatry

## 2022-02-16 ENCOUNTER — Ambulatory Visit: Payer: Medicare HMO | Admitting: Podiatry

## 2022-02-16 DIAGNOSIS — E1042 Type 1 diabetes mellitus with diabetic polyneuropathy: Secondary | ICD-10-CM

## 2022-02-16 DIAGNOSIS — M79674 Pain in right toe(s): Secondary | ICD-10-CM

## 2022-02-16 DIAGNOSIS — B351 Tinea unguium: Secondary | ICD-10-CM

## 2022-02-16 DIAGNOSIS — M79675 Pain in left toe(s): Secondary | ICD-10-CM

## 2022-02-16 DIAGNOSIS — L89619 Pressure ulcer of right heel, unspecified stage: Secondary | ICD-10-CM

## 2022-02-16 NOTE — Progress Notes (Signed)
This patient returns to my office for at risk foot care.  This patient requires this care by a professional since this patient will be at risk due to having type 1 diabetes.  This patient is unable to cut nails herself since the patient cannot reach her nails.These nails are painful walking and wearing shoes. She presents to the office with her daughter.   Her daughter points out sore which has developed on the outside of her right heelThis patient presents for at risk foot care today.  General Appearance  Alert, conversant and in no acute stress.  Vascular  Dorsalis pedis and posterior tibial  pulses are  weakly palpable  bilaterally.  Capillary return is within normal limits  bilaterally. Cold feet bilaterally. Absent digital hair  B/L.  Neurologic  Senn-Weinstein monofilament wire test within normal limits  bilaterally. Muscle power within normal limits bilaterally.  Nails Thick disfigured discolored nails with subungual debris  hallux  bilaterally. No evidence of bacterial infection or drainage bilaterally.  Orthopedic  No limitations of motion  feet .  No crepitus or effusions noted.  No bony pathology or digital deformities noted.  Skin  normotropic skin with no porokeratosis noted bilaterally.  No signs of infections or ulcers noted.     Onychomycosis  Pain in right toes  Pain in left toes  Consent was obtained for treatment procedures.   Mechanical debridement of nails 1-5  bilaterally performed with a nail nipper.  Filed with dremel without incident.    Return office visit    10 weeks                  Told patient to return for periodic foot care and evaluation due to potential at risk complications.   Gardiner Barefoot DPM

## 2022-03-09 ENCOUNTER — Other Ambulatory Visit: Payer: Self-pay | Admitting: Family Medicine

## 2022-04-08 ENCOUNTER — Ambulatory Visit: Payer: Medicare HMO | Admitting: Physician Assistant

## 2022-04-08 VITALS — BP 136/78 | HR 69 | Resp 18 | Wt 126.0 lb

## 2022-04-08 DIAGNOSIS — F02818 Dementia in other diseases classified elsewhere, unspecified severity, with other behavioral disturbance: Secondary | ICD-10-CM

## 2022-04-08 DIAGNOSIS — G3183 Dementia with Lewy bodies: Secondary | ICD-10-CM | POA: Diagnosis not present

## 2022-04-08 MED ORDER — DIVALPROEX SODIUM 125 MG PO DR TAB: 125.0000 mg | DELAYED_RELEASE_TABLET | Freq: Two times a day (BID) | ORAL | 4 refills | Status: DC

## 2022-04-08 NOTE — Progress Notes (Signed)
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 RH female with a history of diabetes, hyperlipidemia, Lewy body disease with behavioral disturbance.  Seen today in follow up for memory loss. Patient is currently on Depakote 125 mg daily for mood control but her daughter reports that there are some changes during the day.  As for her memory, she is not on dementia medication and today, her MMSE is 30/30.  She is overall doing well from the cognitive standpoint..      Follow up in  6 months. Increase Depakote 125 to twice daily due to increasing hallucinations, if needed, this can be going up in dose to 250 mg.  Side effects discussed  continue to control cardiovascular risk factors. Recommend attending senior center for increasing    Subjective:    This patient is accompanied in the office by her daughter who supplements the history.  Previous records as well as any outside records available were reviewed prior to todays visit.  She was last seen on 10/06/2021.  Last MMSE on 10/28/2020 was 23/30   Any changes in memory since last visit? Her daughter reports that her memory is "good ".  Her daughter reports that the patient does not believe that she has any memory problems.   She is able to remember conversations and names.  She continues to do crossword puzzles.  Sometimes, she believes that when watching TV, the personalities may be real.  repeats oneself?  Denies  Disoriented when walking into a room?  Patient denies   Leaving objects in unusual places?  Patient denies Likes to collect dependent objects and hiding them in her pocketbook.  Ambulates  with difficulty?   Patient denies   Recent falls?  Patient denies   Any head injuries?  Patient denies   History of seizures?   Patient denies   Wandering behavior?  Daughter reports that sometimes she goes to the door, trying to go outside, when her  daughter is not there.  Her daughter is in the process of placing some cameras to make sure that her mother is safe, as when she is trying to go out, is usually when "the voices tell me ". Patient drives?   Patient no longer drives  Any mood changes since last visit?  "Sometimes moodier than others "however, she is usually very good disposition.   Any worsening depression?:  Patient denies   Hallucinations? Daughter reports that "she talks to people ", but that these "nothing frightening ".  Recently, she saw a "firefighter at the sidewalk ", also, there "people tell her that want to kill her, and she tells them to talk to Jesus " Paranoia?  Patient denies   Sometimes scared at night not recently.  "Someone says come outside " Patient reports that sleeps well with vivid dreams, one time she was fighting with her cane during her dream.  There is no other reported REM behavior or sleepwalking   History of sleep apnea?  Patient denies   Any hygiene concerns?  "She watches all 4 veins, but does not want to take a shower " Independent of bathing and dressing?  Endorsed  Does the patient needs help with medications?  Daughter in charge Who is in charge of the finances?  Daughter is in charge   Any changes in appetite? Sometimes "she doesn't like to eat, she may forget to do so sometimes " Patient  have trouble swallowing? Patient denies   Does the patient cook?  Cooks but no incidents.  Any kitchen accidents such as leaving the stove on?  Denies Any headaches?  Patient denies   Double vision? Patient denies but has cataracts. Any focal numbness or tingling?  Patient denies   Chronic back pain Patient denies   Unilateral weakness?  Patient denies   Any tremors?  Not often, due to the med  Any history of anosmia?  Patient denies   Any incontinence of urine?  She has stress incontinence but does not wear diapers Any bowel dysfunction?   Patient denies      Patient lives with: trying to get approval  from [facility] for memory care. For now she lives by herself and her daughter follows closely.     HISTORY OF PRESENT ILLNESS 06/05/20: This is an 87 year old right-handed woman with a history of hypertension, hyperlipidemia, diabetes, dementia, presenting for evaluation of dementia. On her PCP appointment in October 2021, daughter reported she is often disoriented to place and time, and hears voices telling her what to do. She lives alone and her daughters check on her multiple times per week and help with housework. She was evaluated by neurologist Dr. Epimenio Foot for similar symptoms in 2016. Symptoms had started around 2014. At that time, hallucinations were primarily auditory but sometimes visual. She also reported sleep difficulty with the voices speaking to her when she tries to fall asleep. She had side effects of nausea on Donepezil and was prescribed Memantine and Seroquel. MOCA 26/30 in 11/2014. Etiology of symptoms at that time unclear, she did not have features of LBD or AD. She was started on hydroxyzine then was lost to follow-up.   Over the years, [name] reports that the auditory hallucinations have worsened. The voices would tell her to go outside sometimes, as far as [name] knows she has not wandered outside. She reports heaviness in her truncal region, that she has gained weight, [name] states she thinks the people are causing the heaviness behind her. The voices tell her that her laundry is not worth keeping. When they are walking, she wants to go a different way because the voices are telling her to go that way. She would offer food when she is eating. She goes to the bathroom and would be heard saying leave her alone. She would stand and stare, and when asked what she is doing, she says she is talking to them. Initially she stated she does not hear anything, then [name] reminds her it occurs all the time, she talks to them and waves while she is at [employer]. She states they are in a group, in her house,  and can be scary for her, keeping her up at night. She only gets 1-2 hours of sleep at night and naps in the day. She recalls Seroquel (and maybe hydroxyzine) causing dizziness in the past. She says she took these for a long time, but was lost to follow-up.    [name] thinks her memory is okay. She continues to live alone. She states her memory is "sometimes not very well." She does not drive. She continues to manage her own medications and finances, they deny any issues. She denies leaving the stove on. She has occasional word-finding difficulties. There is no clear REM behavior disorder. She has occasional hand tremors. She always feel like her balance is off, no falls. She denies any headaches, diplopia, dysarthria/dysphagia, neck pain, focal numbness/tingling/weakness, bowel/bladder dysfunction, anosmia. There is  no family history of dementia, no history of significant head injuries or alcohol use.      Laboratory Data: EKG in 06/2019 showed a QTc of 460   I personally reviewed head CTs done over the years, most recently in 06/2019 which showed moderate diffuse atrophy, there appears to be more in the right frontal and occipital regions.   Neurocognitive testing 10/28/2020 Dr. Roseanne Reno Ms. Fenstermaker demonstrated impaired performance on measures of processing speed, executive function, and her visuospatial and constructional functioning fell at an unusually low level, suggesting some mild difficulties. She also had marked naming problems and diminished phonemic as compared to semantic fluency. Memory performance was notable for low encoding of information but she retained verbal information well and does not appear to have a frank storage problem at this time. She did well on measures of attention/working memory. Her CDR places her in the mild dementia range based on the history of provided and her objective test performance, although her daughter characterized her as functioning at a moderate dementia level. It  is possible that movement or other issues are confounding her rating or perhaps the patient simply tests well. She did score a 4/4 on the Mayo Fluctuations Questionnaire.   Ms. Colledge is thus demonstrating a dementia level problem with primary deficits on measures of processing speed, executive functioning, and naming and she has some additional visuospatial/constructional issues. 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 Her test data are weakly supportive although her visuospatial functioning is less affected than might be expected. A mixed pathological picture is also possible, although her test data are equivocal for AD (has naming problems but no memory storage problem).    PREVIOUS MEDICATIONS:   CURRENT MEDICATIONS:  Outpatient Encounter Medications as of 04/08/2022  Medication Sig   amLODipine (NORVASC) 10 MG tablet Take 1 tablet by mouth once daily   NOVOLIN 70/30 RELION (70-30) 100 UNIT/ML injection 25 Units.   [DISCONTINUED] divalproex (DEPAKOTE) 125 MG DR tablet Take 1 tablet (125 mg total) by mouth at bedtime.   divalproex (DEPAKOTE) 125 MG DR tablet Take 1 tablet (125 mg total) by mouth 2 (two) times daily.   No facility-administered encounter medications on file as of 04/08/2022.       04/08/2022    1:00 PM 10/28/2020    3:00 PM 06/05/2020   10:00 AM  MMSE - Mini Mental State Exam  Orientation to time 5 5 5   Orientation to Place 5 5 2   Registration 3 3 0  Attention/ Calculation 5 0 0  Recall 3 1 0  Language- name 2 objects 2 2 2   Language- repeat 1 1 1   Language- follow 3 step command 3 3 3   Language- read & follow direction 1 1 1   Write a sentence 1 1 1   Copy design 1 1 0  Total score 30 23 15       12/02/2014   10:00 AM  Montreal Cognitive Assessment   Visuospatial/ Executive (0/5) 3  Naming (0/3) 3  Attention: Read list of digits (0/2) 2   Attention: Read list of letters (0/1) 1  Attention: Serial 7 subtraction starting at 100 (0/3) 3  Language: Repeat phrase (0/2) 2  Language : Fluency (0/1) 0  Abstraction (0/2) 2  Delayed Recall (0/5) 3  Orientation (0/6) 6  Total 25  Adjusted Score (based on education) 25    Objective:     PHYSICAL  EXAMINATION:    VITALS:   Vitals:   04/08/22 1259  BP: 136/78  Pulse: 69  Resp: 18  SpO2: 98%  Weight: 126 lb (57.2 kg)    GEN:  The patient appears stated age and is in NAD. HEENT:  Normocephalic, atraumatic.   Neurological examination:  General: NAD, well-groomed, appears stated age. Orientation: The patient is alert. Oriented to person, place and date Cranial nerves: There is good facial symmetry.The speech is fluent and clear. No aphasia or dysarthria. Fund of knowledge is appropriate. Recent and remote memory are impaired. Attention and concentration are reduced.  Able to name objects and repeat phrases.  Hearing is intact to conversational tone.    Sensation: Sensation is intact to light touch throughout Motor: Strength is at least antigravity x4. Tremors: minimal tremor L hand DTR's 2/4 in UE/LE     Movement examination: Tone: There is normal tone in the UE/LE. No cogwheel. Abnormal movements:   No myoclonus.  No asterixis.   Coordination:  There is no decremation with RAM's. Normal finger to nose  Gait and Station: The patient has no difficulty arising out of a deep-seated chair without the use of the hands. The patient's stride length is good.  Gait is cautious and narrow.    Thank you for allowing [place] the opportunity to participate in the care of this nice patient. Please do not hesitate to contact us for any questions or concerns.   Total time spent on today's visit was 30 minutes dedicated to this patient today, preparing to see patient, examining the patient, ordering tests and/or medications and counseling the patient, documenting clinical information in the  EHR or other health record, independently interpreting results and communicating results to the patient/family, discussing treatment and goals, answering patient's questions and coordinating care.  Cc:  Frederica Kuster, MD  Marlowe Kays 04/08/2022 2:00 PM

## 2022-04-08 NOTE — Patient Instructions (Signed)
It was a pleasure to see you today at our office.   Recommendations:  Follow up in  6 months  Increase Depakote 125 mg two times a day   Consider [facility]  [address], [place] [address] [phone]  Hours of Operation Mondays to Thursdays: 8 am to 8 pm,Fridays: 9 am to 8 pm, Saturdays: 9 am to 1 pm Sundays: Closed  [email or web address]      Whom to call:  Memory  decline, memory medications: Call out office [phone]   For psychiatric meds, mood meds: Please have your primary care physician manage these medications.      For assessment of decision of mental capacity and competency:  Call Dr. Erick Blinks, geriatric psychiatrist at [phone]  For guidance in geriatric dementia issues please call [facility] [phone]   If you have any severe symptoms of a stroke, or other severe issues such as confusion,severe chills or fever, etc call 911 or go to the ER as you may need to be evaluate further   Feel free to visit Facebook page " [email or web address]" for tips of how to care for people with memory problems.      RECOMMENDATIONS FOR ALL PATIENTS WITH MEMORY PROBLEMS: 1. Continue to exercise (Recommend 30 minutes of walking everyday, or 3 hours every week) 2. Increase social interactions - continue going to [place] and enjoy social gatherings with friends and family 3. Eat healthy, avoid fried foods and eat more fruits and vegetables 4. Maintain adequate blood pressure, blood sugar, and blood cholesterol level. Reducing the risk of stroke and cardiovascular disease also helps promoting better memory. 5. Avoid stressful situations. Live a simple life and avoid aggravations. Organize your time and prepare for the next day in anticipation. 6. Sleep well, avoid any interruptions of sleep and avoid any distractions in the bedroom that may interfere with adequate sleep  quality 7. Avoid sugar, avoid sweets as there is a strong link between excessive sugar intake, diabetes, and cognitive impairment We discussed the Mediterranean diet, which has been shown to help patients reduce the risk of progressive memory disorders and reduces cardiovascular risk. This includes eating fish, eat fruits and green leafy vegetables, nuts like almonds and hazelnuts, walnuts, and also use olive oil. Avoid fast foods and fried foods as much as possible. Avoid sweets and sugar as sugar use has been linked to worsening of memory function.  There is always a concern of gradual progression of memory problems. If this is the case, then we may need to adjust level of care according to patient needs. Support, both to the patient and caregiver, should then be put into place.    The Alzheimer's Association is here all day, every day for people facing Alzheimer's disease through our free 24/7 Helpline: ([phone]. The Helpline provides reliable information and support to all those who need assistance, such as individuals living with memory loss, Alzheimer's or other dementia, caregivers, health care professionals and the public.  Our highly trained and knowledgeable staff can help you with: Understanding memory loss, dementia and Alzheimer's  Medications and other treatment options  General information about aging and brain health  Skills to provide quality care and to find the best care from professionals  Legal, financial and living-arrangement decisions Our Helpline also features: Confidential care consultation provided by master's level clinicians who can help with decision-making support, crisis assistance and education on issues families face every day  Help in a caller's preferred language using our translation  service that features more than 200 languages and dialects  Referrals to local community programs, services and ongoing support     FALL PRECAUTIONS: Be cautious when  walking. Scan the area for obstacles that may increase the risk of trips and falls. When getting up in the mornings, sit up at the edge of the bed for a few minutes before getting out of bed. Consider elevating the bed at the head end to avoid drop of blood pressure when getting up. Walk always in a well-lit room (use night lights in the walls). Avoid area rugs or power cords from appliances in the middle of the walkways. Use a walker or a cane if necessary and consider physical therapy for balance exercise. Get your eyesight checked regularly.  FINANCIAL OVERSIGHT: Supervision, especially oversight when making financial decisions or transactions is also recommended.  HOME SAFETY: Consider the safety of the kitchen when operating appliances like stoves, microwave oven, and blender. Consider having supervision and share cooking responsibilities until no longer able to participate in those. Accidents with firearms and other hazards in the house should be identified and addressed as well.   ABILITY TO BE LEFT ALONE: If patient is unable to contact 911 operator, consider using LifeLine, or when the need is there, arrange for someone to stay with patients. Smoking is a fire hazard, consider supervision or cessation. Risk of wandering should be assessed by caregiver and if detected at any point, supervision and safe proof recommendations should be instituted.  MEDICATION SUPERVISION: Inability to self-administer medication needs to be constantly addressed. Implement a mechanism to ensure safe administration of the medications.   DRIVING: Regarding driving, in patients with progressive memory problems, driving will be impaired. We advise to have someone else do the driving if trouble finding directions or if minor accidents are reported. Independent driving assessment is available to determine safety of driving.   If you are interested in the driving assessment, you can contact the following:  The  [employer] in [place]  [place] [phone] or [phone]      [place] refers to food and lifestyle choices that are based on the traditions of countries located on the [employer]. This way of eating has been shown to help prevent certain conditions and improve outcomes for people who have chronic diseases, like kidney disease and heart disease. What are tips for following this plan? Lifestyle  Cook and eat meals together with your family, when possible. Drink enough fluid to keep your urine clear or pale yellow. Be physically active every day. This includes: Aerobic exercise like running or swimming. Leisure activities like gardening, walking, or housework. Get 7-8 hours of sleep each night. If recommended by your health care provider, drink red wine in moderation. This means 1 glass a day for nonpregnant women and 2 glasses a day for men. A glass of wine equals 5 oz (150 mL). Reading food labels  Check the serving size of packaged foods. For foods such as rice and pasta, the serving size refers to the amount of cooked product, not dry. Check the total fat in packaged foods. Avoid foods that have saturated fat or trans fats. Check the ingredients list for added sugars, such as corn syrup. Shopping  At the grocery store, buy most of your food from the areas near the walls of the store. This includes: Fresh fruits and vegetables (produce). Grains, beans, nuts, and  seeds. Some of these may be available in unpackaged forms or large amounts (in bulk). Fresh seafood. Poultry and eggs. Low-fat dairy products. Buy whole ingredients instead of prepackaged foods. Buy fresh fruits and vegetables in-season from local farmers markets. Buy frozen fruits and vegetables in resealable bags. If you do not have access to quality fresh seafood,  buy precooked frozen shrimp or canned fish, such as tuna, salmon, or sardines. Buy small amounts of raw or cooked vegetables, salads, or olives from the deli or salad bar at your store. Stock your pantry so you always have certain foods on hand, such as olive oil, canned tuna, canned tomatoes, rice, pasta, and beans. Cooking  Cook foods with extra-virgin olive oil instead of using butter or other vegetable oils. Have meat as a side dish, and have vegetables or grains as your main dish. This means having meat in small portions or adding small amounts of meat to foods like pasta or stew. Use beans or vegetables instead of meat in common dishes like chili or lasagna. Experiment with different cooking methods. Try roasting or broiling vegetables instead of steaming or sauteing them. Add frozen vegetables to soups, stews, pasta, or rice. Add nuts or seeds for added healthy fat at each meal. You can add these to yogurt, salads, or vegetable dishes. Marinate fish or vegetables using olive oil, lemon juice, garlic, and fresh herbs. Meal planning  Plan to eat 1 vegetarian meal one day each week. Try to work up to 2 vegetarian meals, if possible. Eat seafood 2 or more times a week. Have healthy snacks readily available, such as: Vegetable sticks with hummus. Greek yogurt. Fruit and nut trail mix. Eat balanced meals throughout the week. This includes: Fruit: 2-3 servings a day Vegetables: 4-5 servings a day Low-fat dairy: 2 servings a day Fish, poultry, or lean meat: 1 serving a day Beans and legumes: 2 or more servings a week Nuts and seeds: 1-2 servings a day Whole grains: 6-8 servings a day Extra-virgin olive oil: 3-4 servings a day Limit red meat and sweets to only a few servings a month What are my food choices? Mediterranean diet Recommended Grains: Whole-grain pasta. Brown rice. Bulgar wheat. Polenta. Couscous. Whole-wheat bread. [place]. Vegetables: Artichokes. Beets. Broccoli.  Cabbage. Carrots. Eggplant. Green beans. Chard. Kale. Spinach. Onions. Leeks. Peas. Squash. Tomatoes. Peppers. Radishes. Fruits: Apples. Apricots. Avocado. Berries. Bananas. Cherries. Dates. Figs. Grapes. Lemons. Melon. Oranges. Peaches. Plums. Pomegranate. Meats and other protein foods: Beans. Almonds. Sunflower seeds. Pine nuts. Peanuts. [place]. Salmon. Scallops. Shrimp. [place]. Tilapia. Clams. Oysters. Eggs. Dairy: Low-fat milk. Cheese. Greek yogurt. Beverages: Water. Red wine. Herbal tea. Fats and oils: Extra virgin olive oil. Avocado oil. Grape seed oil. Sweets and desserts: [place] yogurt with honey. Baked apples. Poached pears. Trail mix. Seasoning and other foods: Basil. Cilantro. Coriander. Cumin. Mint. Parsley. Sage. Rosemary. Tarragon. Garlic. Oregano. Thyme. Pepper. Balsalmic vinegar. Tahini. Hummus. Tomato sauce. Olives. Mushrooms. Limit these Grains: Prepackaged pasta or rice dishes. Prepackaged cereal with added sugar. Vegetables: Deep fried potatoes (french fries). Fruits: Fruit canned in syrup. Meats and other protein foods: Beef. Pork. Lamb. Poultry with skin. Hot dogs. [place] Salines. Dairy: Ice cream. Sour cream. Whole milk. Beverages: Juice. Sugar-sweetened soft drinks. Beer. Liquor and spirits. Fats and oils: Butter. Canola oil. Vegetable oil. Beef fat (tallow). Lard. Sweets and desserts: Cookies. Cakes. Pies. Candy. Seasoning and other foods: Mayonnaise. Premade sauces and marinades. The items listed may not be a complete list. Talk with your dietitian about what dietary choices  are right for you. Summary The Mediterranean diet includes both food and lifestyle choices. Eat a variety of fresh fruits and vegetables, beans, nuts, seeds, and whole grains. Limit the amount of red meat and sweets that you eat. Talk with your health care provider about whether it is safe for you to drink red wine in moderation. This means 1 glass a day for nonpregnant women and 2 glasses a day for men. A glass  of wine equals 5 oz (150 mL). This information is not intended to replace advice given to you by your health care provider. Make sure you discuss any questions you have with your health care provider. Document Released: 12/31/2015 Document Revised: 02/02/2016 Document Reviewed: 12/31/2015 Elsevier Interactive Patient Education  2017 [employer].

## 2022-05-03 ENCOUNTER — Ambulatory Visit: Payer: Medicare HMO | Admitting: Family Medicine

## 2022-05-04 ENCOUNTER — Encounter: Payer: Self-pay | Admitting: Family Medicine

## 2022-05-04 ENCOUNTER — Ambulatory Visit (INDEPENDENT_AMBULATORY_CARE_PROVIDER_SITE_OTHER): Payer: Medicare HMO | Admitting: Family Medicine

## 2022-05-04 VITALS — BP 122/76 | HR 66 | Temp 96.3°F | Ht 61.0 in | Wt 126.4 lb

## 2022-05-04 DIAGNOSIS — E1140 Type 2 diabetes mellitus with diabetic neuropathy, unspecified: Secondary | ICD-10-CM | POA: Diagnosis not present

## 2022-05-04 DIAGNOSIS — R443 Hallucinations, unspecified: Secondary | ICD-10-CM

## 2022-05-04 DIAGNOSIS — Z794 Long term (current) use of insulin: Secondary | ICD-10-CM | POA: Diagnosis not present

## 2022-05-04 DIAGNOSIS — I519 Heart disease, unspecified: Secondary | ICD-10-CM | POA: Diagnosis not present

## 2022-05-04 DIAGNOSIS — G3183 Dementia with Lewy bodies: Secondary | ICD-10-CM | POA: Diagnosis not present

## 2022-05-04 DIAGNOSIS — I10 Essential (primary) hypertension: Secondary | ICD-10-CM | POA: Diagnosis not present

## 2022-05-04 DIAGNOSIS — F02818 Dementia in other diseases classified elsewhere, unspecified severity, with other behavioral disturbance: Secondary | ICD-10-CM

## 2022-05-04 DIAGNOSIS — Z23 Encounter for immunization: Secondary | ICD-10-CM | POA: Diagnosis not present

## 2022-05-04 MED ORDER — LISINOPRIL 10 MG PO TABS: 10.0000 mg | ORAL_TABLET | Freq: Every day | ORAL | 3 refills | Status: DC

## 2022-05-04 NOTE — Progress Notes (Signed)
Provider:  Jacalyn Lefevre, MD  Careteam: Patient Care Team: Frederica Kuster, MD as PCP - General (Family Medicine) Van Clines, MD as Consulting Physician (Neurology) Lilian Kapur Rachelle Hora, DPM as Consulting Physician (Podiatry) Gwynneth Munson, Corrie Dandy (Neurology)  PLACE OF SERVICE:  [facility]  Advanced Directive information    No Known Allergies  Chief Complaint  Patient presents with   Medical Management of Chronic Issues    Patient presents today for a 4 month follow-up.   Quality Metric Gaps    AWV, microalbumin, eye& foot exam, TDAp, pneumonia, zoster, COVID#5,Flu     HPI: Patient is a 87 y.o. female .  Patient is here for medical management of chronic problems including Lewy body dementia diabetes hypertension and hyperlipidemia.  She has seen her neurologist recently who increased her Depakote from 125 daily to 125 twice daily.  MMSE at that office was 30/30.  She still hears voices that tell her what to do and not do, not always healthy advice. In looking over her medicines she should probably be on an ACE or an ARB given her hypertension.  There is no history of allergy or side effects to either of those drugs. Last A1c done 3 months ago was 7.3 and I think that is a reasonable result given her age.  She denies falling.  She has a cane and walker but will not use them.  Review of Systems:  Review of Systems  Constitutional: Negative.   HENT: Negative.    Respiratory: Negative.    Cardiovascular: Negative.   Musculoskeletal: Negative.   Skin: Negative.   Psychiatric/Behavioral:  Positive for hallucinations.        Hallucinations are auditory  All other systems reviewed and are negative.   Past Medical History:  Diagnosis Date   Coronary artery disease    Dementia (HCC)    Diabetes mellitus    Glaucoma    Hallucination    Heart disease    History of heart artery stent 2009   Hypertension    Memory loss    just started on aricept   Mild cognitive  impairment    Osteoarthritis    Phobia    Psychosis (HCC)    Stroke (HCC)    Vision abnormalities    Past Surgical History:  Procedure Laterality Date   CORONARY ANGIOPLASTY WITH STENT PLACEMENT     CORONARY STENT PLACEMENT     Social History:   reports that she quit smoking about 58 years ago. Her smoking use included cigarettes. She has never used smokeless tobacco. She reports that she does not drink alcohol and does not use drugs.  Family History  Problem Relation Age of Onset   Hypertension Mother    Diabetes Mother    Hypertension Father    Diabetes Father    Hypertension Sister    Diabetes Sister    Kidney disease Sister    Liver disease Brother    Hypertension Daughter    Fibromyalgia Daughter    Rheum arthritis Daughter    Breast cancer Daughter    Hypertension Son    Seizures Granddaughter    Epilepsy Granddaughter     Medications: Patient's Medications  New Prescriptions   No medications on file  Previous Medications   AMLODIPINE (NORVASC) 10 MG TABLET    Take 1 tablet by mouth once daily   DIVALPROEX (DEPAKOTE) 125 MG DR TABLET    Take 1 tablet (125 mg total) by mouth 2 (two) times  daily.   NOVOLIN 70/30 RELION (70-30) 100 UNIT/ML INJECTION    25 Units.  Modified Medications   No medications on file  Discontinued Medications   No medications on file    Physical Exam:  Vitals:   05/04/22 1437  BP: 122/76  Pulse: 66  Temp: (!) 96.3 F (35.7 C)  SpO2: 98%  Weight: 126 lb 6.4 oz (57.3 kg)  Height: 5\' 1"  (1.549 m)   Body mass index is 23.88 kg/m. Wt Readings from Last 3 Encounters:  05/04/22 126 lb 6.4 oz (57.3 kg)  04/08/22 126 lb (57.2 kg)  12/29/21 129 lb 6.4 oz (58.7 kg)    Physical Exam Vitals and nursing note reviewed.  Constitutional:      Appearance: Normal appearance.  Cardiovascular:     Rate and Rhythm: Normal rate and regular rhythm.  Pulmonary:     Effort: Pulmonary effort is normal.     Breath sounds: Normal breath  sounds.  Musculoskeletal:     Comments: Walking her down the hall, gait is a little bit unsteady  Neurological:     General: No focal deficit present.     Mental Status: She is alert and oriented to person, place, and time.     Labs reviewed: Basic Metabolic Panel: Recent Labs    05/23/21 0107 09/28/21 0954  NA 136 138  K 4.0 4.5  CL 108 103  CO2 22 26  GLUCOSE 151* 144*  BUN 19 20  CREATININE 1.07* 1.00*  CALCIUM 9.3 9.7   Liver Function Tests: No results for input(s): "AST", "ALT", "ALKPHOS", "BILITOT", "PROT", "ALBUMIN" in the last 8760 hours. No results for input(s): "LIPASE", "AMYLASE" in the last 8760 hours. No results for input(s): "AMMONIA" in the last 8760 hours. CBC: Recent Labs    05/23/21 0107  WBC 5.9  NEUTROABS 3.3  HGB 11.7*  HCT 35.6*  MCV 89.0  PLT 239   Lipid Panel: No results for input(s): "CHOL", "HDL", "LDLCALC", "TRIG", "CHOLHDL", "LDLDIRECT" in the last 8760 hours. TSH: No results for input(s): "TSH" in the last 8760 hours. A1C: Lab Results  Component Value Date   HGBA1C 7.3 (H) 12/29/2021     Assessment/Plan  1. Need for influenza vaccination Flu shot given  2. Type 2 diabetes mellitus with diabetic neuropathy, with long-term current use of insulin (HCC) Currently on NPH and regular insulin suspension (Novolin 70/30) 25 units  3. Dementia, Lewy body with behavior disturbance (HCC) Depakote increased to 125 twice daily  4. Essential hypertension Would like to initiate an ACE rather than calcium channel blocker.  Her blood pressure is doing well but I think for renal protection ACE might be more appropriate DC amlodipine and begin lisinopril 10 mg and follow blood pressure at home  5. HALLUCINATION Part of the Lewy body dementia.  Using Depakote  6. Heart disease Was stented about 15 years ago and not on any anticoagulant now.  For both the diabetes and the stenting I have recommended at least an baby aspirin once or twice a  week   07-11-1969, MD [facility] [phone]

## 2022-05-04 NOTE — Addendum Note (Signed)
Addended by: Frederica Kuster on: 05/04/2022 03:18 PM   Modules accepted: Orders

## 2022-05-05 LAB — HEMOGLOBIN A1C
Hgb A1c MFr Bld: 6.6 % of total Hgb — ABNORMAL HIGH (ref <5.7)
Mean Plasma Glucose: 143 mg/dL
eAG (mmol/L): 7.9 mmol/L

## 2022-05-06 ENCOUNTER — Emergency Department (HOSPITAL_COMMUNITY): Payer: Medicare HMO

## 2022-05-06 ENCOUNTER — Emergency Department (HOSPITAL_COMMUNITY)
Admission: EM | Admit: 2022-05-06 | Discharge: 2022-05-07 | Disposition: A | Discharge status: Home / Self Care | Payer: Medicare HMO | Attending: Emergency Medicine | Admitting: Emergency Medicine

## 2022-05-06 ENCOUNTER — Encounter (HOSPITAL_COMMUNITY): Payer: Self-pay | Admitting: Emergency Medicine

## 2022-05-06 DIAGNOSIS — Z79899 Other long term (current) drug therapy: Secondary | ICD-10-CM | POA: Diagnosis not present

## 2022-05-06 DIAGNOSIS — F0280 Dementia in other diseases classified elsewhere without behavioral disturbance: Secondary | ICD-10-CM | POA: Diagnosis not present

## 2022-05-06 DIAGNOSIS — G3183 Dementia with Lewy bodies: Secondary | ICD-10-CM | POA: Diagnosis not present

## 2022-05-06 DIAGNOSIS — Z794 Long term (current) use of insulin: Secondary | ICD-10-CM | POA: Diagnosis not present

## 2022-05-06 DIAGNOSIS — R55 Syncope and collapse: Secondary | ICD-10-CM | POA: Insufficient documentation

## 2022-05-06 DIAGNOSIS — R29810 Facial weakness: Secondary | ICD-10-CM | POA: Diagnosis not present

## 2022-05-06 DIAGNOSIS — E860 Dehydration: Secondary | ICD-10-CM | POA: Diagnosis not present

## 2022-05-06 DIAGNOSIS — I10 Essential (primary) hypertension: Secondary | ICD-10-CM | POA: Diagnosis not present

## 2022-05-06 DIAGNOSIS — N390 Urinary tract infection, site not specified: Secondary | ICD-10-CM

## 2022-05-06 DIAGNOSIS — R531 Weakness: Secondary | ICD-10-CM | POA: Diagnosis not present

## 2022-05-06 DIAGNOSIS — R4182 Altered mental status, unspecified: Secondary | ICD-10-CM | POA: Diagnosis not present

## 2022-05-06 LAB — CBC WITH DIFFERENTIAL/PLATELET
Abs Immature Granulocytes: 0.02 10*3/uL (ref 0.00–0.07)
Basophils Absolute: 0 10*3/uL (ref 0.0–0.1)
Basophils Relative: 0 %
Eosinophils Absolute: 0.1 10*3/uL (ref 0.0–0.5)
Eosinophils Relative: 2 %
HCT: 42.4 % (ref 36.0–46.0)
Hemoglobin: 14.1 g/dL (ref 12.0–15.0)
Immature Granulocytes: 0 %
Lymphocytes Relative: 29 %
Lymphs Abs: 2 10*3/uL (ref 0.7–4.0)
MCH: 29.6 pg (ref 26.0–34.0)
MCHC: 33.3 g/dL (ref 30.0–36.0)
MCV: 88.9 fL (ref 80.0–100.0)
Monocytes Absolute: 0.7 10*3/uL (ref 0.1–1.0)
Monocytes Relative: 10 %
Neutro Abs: 3.9 10*3/uL (ref 1.7–7.7)
Neutrophils Relative %: 59 %
Platelets: 260 10*3/uL (ref 150–400)
RBC: 4.77 MIL/uL (ref 3.87–5.11)
RDW: 13 % (ref 11.5–15.5)
WBC: 6.7 10*3/uL (ref 4.0–10.5)
nRBC: 0 % (ref 0.0–0.2)

## 2022-05-06 LAB — TROPONIN I (HIGH SENSITIVITY)
Troponin I (High Sensitivity): 5 ng/L (ref <18)
Troponin I (High Sensitivity): 5 ng/L (ref <18)

## 2022-05-06 LAB — COMPREHENSIVE METABOLIC PANEL
ALT: 14 U/L (ref 0–44)
AST: 24 U/L (ref 15–41)
Albumin: 3.9 g/dL (ref 3.5–5.0)
Alkaline Phosphatase: 71 U/L (ref 38–126)
Anion gap: 11 (ref 5–15)
BUN: 20 mg/dL (ref 8–23)
CO2: 21 mmol/L — ABNORMAL LOW (ref 22–32)
Calcium: 9.2 mg/dL (ref 8.9–10.3)
Chloride: 106 mmol/L (ref 98–111)
Creatinine, Ser: 1.07 mg/dL — ABNORMAL HIGH (ref 0.44–1.00)
GFR, Estimated: 51 mL/min — ABNORMAL LOW (ref >60)
Glucose, Bld: 98 mg/dL (ref 70–99)
Potassium: 3.8 mmol/L (ref 3.5–5.1)
Sodium: 138 mmol/L (ref 135–145)
Total Bilirubin: 0.4 mg/dL (ref 0.3–1.2)
Total Protein: 7.5 g/dL (ref 6.5–8.1)

## 2022-05-06 NOTE — ED Provider Triage Note (Signed)
Emergency Medicine Provider Triage Evaluation Note  Robyn [name] , a 87 y.o. female  was evaluated in triage.  Pt complains of  syncopal episode earlier tonight, orthostasis, weakness. PMH significant for Lewy body dementia, hypertension, diabetes, previous stroke, coronary artery disease who presents after syncopal episode earlier this night.  Questionable facial droop noted apparently after sternal rub, vital signs positive orthostatics, 250 mL bolus given by EMS.  Patient is reportedly had 5 pound weight loss in last 4 months and doctor was worried.  Endorses generalized weakness.  She denies any chest pain, shortness of breath, fever, chills.  She denies any numbness, tingling, weakness on my exam.  Review of Systems  Positive: Syncopal episode, weakness Negative: Fever, chills, chest pain, shortness of breath  Physical Exam  BP (!) 146/73 (BP Location: Left Arm)   Pulse 65   Temp 98.2 F (36.8 C) (Oral)   Resp 16   SpO2 100%  Gen:   Awake, no distress   Resp:  Normal effort  MSK:   Moves extremities without difficulty  Other:  No focal neurologic deficits noted on my exam, patient moving all 4 limbs spontaneously.  She can follow commands without difficulty.  She is alert and oriented to baseline.  Medical Decision Making  Medically screening exam initiated at 6:53 PM.  Appropriate orders placed.  Robyn [name] was informed that the remainder of the evaluation will be completed by another provider, this initial triage assessment does not replace that evaluation, and the importance of remaining in the ED until their evaluation is complete.  Workup initiated   Olene Floss, [place] 05/06/22 1855

## 2022-05-06 NOTE — ED Triage Notes (Signed)
CBG 137 per EMS

## 2022-05-06 NOTE — ED Notes (Signed)
Attempted to collect urine sample, was not able to collect.

## 2022-05-06 NOTE — ED Triage Notes (Signed)
Sitting on sofa and passed out. Only responsive to painful stimuli. When she did wake, facial drooping that then resolved. VS were positive for orthostatics. Complained of dizziness when she stood up. 250 bolus given by EMS.  Pt has lost 5 pounds in 4 mos. Per MD appt yesterday. Does complain of weakness.

## 2022-05-07 ENCOUNTER — Emergency Department (HOSPITAL_COMMUNITY): Payer: Medicare HMO

## 2022-05-07 DIAGNOSIS — R55 Syncope and collapse: Secondary | ICD-10-CM | POA: Diagnosis not present

## 2022-05-07 LAB — URINALYSIS, ROUTINE W REFLEX MICROSCOPIC
Bilirubin Urine: NEGATIVE
Glucose, UA: NEGATIVE mg/dL
Hgb urine dipstick: NEGATIVE
Ketones, ur: 5 mg/dL — AB
Nitrite: NEGATIVE
Protein, ur: NEGATIVE mg/dL
Specific Gravity, Urine: 1.024 (ref 1.005–1.030)
pH: 5 (ref 5.0–8.0)

## 2022-05-07 MED ORDER — LACTATED RINGERS IV BOLUS
1000.0000 mL | Freq: Once | INTRAVENOUS | Status: AC
  Administered 2022-05-07: 1000 mL via INTRAVENOUS

## 2022-05-07 MED ORDER — CEPHALEXIN 250 MG PO CAPS
500.0000 mg | ORAL_CAPSULE | Freq: Once | ORAL | Status: AC
  Administered 2022-05-07: 500 mg via ORAL
  Filled 2022-05-07: qty 2

## 2022-05-07 MED ORDER — CEPHALEXIN 500 MG PO CAPS: 500.0000 mg | ORAL_CAPSULE | Freq: Two times a day (BID) | ORAL | 0 refills | Status: DC

## 2022-05-07 NOTE — Progress Notes (Signed)
CSW spoke with MD who states patient's daughter is interested in a list of facilities that offer long term care options.  CSW spoke with patient's daughter [name] who states she has began the [facility] application process and would like a list of facilities that offer LTC. [name] agreeable to receive these resources via e-mail. CSW confirmed correct e-mail address and sent information over for review.  No additional TOC needs at this time.  Edwin Dada, MSW, LCSW Transitions of Care  Clinical Social Worker II [phone]

## 2022-05-07 NOTE — Evaluation (Signed)
Physical Therapy Evaluation Patient Details Name: Robyn [name] MRN: [id number] DOB: 1935/05/05 Today's Date: 05/07/2022  History of Present Illness  Pt is a 87 y.o. F who presents 05/06/2022 with syncopal episode. Significant PMH: Lewy body dementia, DM, HTN, HLD.  Clinical Impression  PTA, pt lives alone, is independent with mobility/ADL's, and requires assist for all IADL's. Per pt daughter, pt has been frequently refusing to eat or take medications due to a combination of hallucinations and decreased recall. On PT evaluation, pt is overall calm and cooperative and following one step commands. Pt with bilateral knee buckle upon initial stand, requiring maximal assist to recover to upright and return to sitting edge of stretcher to prevent fall. Pt then able to ambulate ~30 ft with consistent therapist external support. Unfortunately, she has a walker and cane at home, but will not use. Orthostatics negative upon assessment. Pt daughter reports there is not 24/7 assist available. Pt presents as a high fall risk based on impaired standing balance, decreased safety awareness and decreased gait speed. In light of deficits and decreased caregiver support, recommend SNF.  Vitals: Supine: 147/75 (97) Sitting pre mobility: 143/73 Sitting post mobility: 143/75     Recommendations for follow up therapy are one component of a multi-disciplinary discharge planning process, led by the attending physician.  Recommendations may be updated based on patient status, additional functional criteria and insurance authorization.  Follow Up Recommendations Skilled nursing-short term rehab (<3 hours/day) Can patient physically be transported by private vehicle: Yes    Assistance Recommended at Discharge Frequent or constant Supervision/Assistance  Patient can return home with the following  A little help with walking and/or transfers;Assistance with cooking/housework;A little help with  bathing/dressing/bathroom;Direct supervision/assist for medications management;Direct supervision/assist for financial management;Assist for transportation;Help with stairs or ramp for entrance    Equipment Recommendations None recommended by PT  Recommendations for Other Services       Functional Status Assessment Patient has had a recent decline in their functional status and demonstrates the ability to make significant improvements in function in a reasonable and predictable amount of time.     Precautions / Restrictions Precautions Precautions: Fall Restrictions Weight Bearing Restrictions: No      Mobility  Bed Mobility Overal bed mobility: Needs Assistance Bed Mobility: Supine to Sit, Sit to Supine     Supine to sit: Min guard Sit to supine: Min assist   General bed mobility comments: Min guard for safety for transition to edge of stretcher. minA for LE assist to elevate back into stretcher    Transfers Overall transfer level: Needs assistance Equipment used: None Transfers: Sit to/from Stand Sit to Stand: Min assist, Max assist           General transfer comment: Pt initially standing from edge of stretcher and demonstrated + bilateral knee buckle, requiring maxA to recover and return to sititng edge of stretcher. On subsequent attempt, performed face to face transfer to standing with minA    Ambulation/Gait Ambulation/Gait assistance: Min assist Gait Distance (Feet): 30 Feet Assistive device: 1 person hand held assist Gait Pattern/deviations: Step-through pattern, Decreased stride length Gait velocity: decreased     General Gait Details: Pt relying on therapist for external support consistently, minA for balance  Stairs            Wheelchair Mobility    Modified Rankin (Stroke Patients Only)       Balance Overall balance assessment: Needs assistance Sitting-balance support: Feet supported Sitting balance-Leahy Scale: Good Sitting balance -  Comments: Able to don/doff shoes with supervision   Standing balance support: Bilateral upper extremity supported Standing balance-Leahy Scale: Poor Standing balance comment: reliant on therapist support                             Pertinent Vitals/Pain Pain Assessment Pain Assessment: PAINAD Breathing: normal Negative Vocalization: none Facial Expression: facial grimacing Body Language: tense, distressed pacing, fidgeting Consolability: no need to console PAINAD Score: 3    Home Living Family/patient expects to be discharged to:: Private residence Living Arrangements: Alone Available Help at Discharge: Family Type of Home: House Home Access: Ramped entrance       Home Layout: One level Home Equipment: Agricultural consultant (2 wheels);Cane - single point      Prior Function Prior Level of Function : Needs assist             Mobility Comments: independent, pt daughter denies hx of recent falls ADLs Comments: independent ADL's, requires assist for IADL's. Frequently missing meals and medications     Hand Dominance        Extremity/Trunk Assessment   Upper Extremity Assessment Upper Extremity Assessment: Overall WFL for tasks assessed    Lower Extremity Assessment Lower Extremity Assessment: Defer to PT evaluation       Communication   Communication: No difficulties  Cognition Arousal/Alertness: Awake/alert Behavior During Therapy: WFL for tasks assessed/performed Overall Cognitive Status: History of cognitive impairments - at baseline                                 General Comments: Hx Lewy Body dementia, pt following all commands and cooperative        General Comments      Exercises     Assessment/Plan    PT Assessment Patient needs continued PT services  PT Problem List Decreased strength;Decreased activity tolerance;Decreased balance;Decreased mobility       PT Treatment Interventions DME instruction;Gait  training;Therapeutic activities;Functional mobility training;Therapeutic exercise;Balance training;Patient/family education    PT Goals (Current goals can be found in the Care Plan section)  Acute Rehab PT Goals Patient Stated Goal: pt daughter would like pt to not live by herself PT Goal Formulation: With patient/family Time For Goal Achievement: 05/21/22 Potential to Achieve Goals: Fair    Frequency Min 2X/week     Co-evaluation               AM-PAC PT "6 Clicks" Mobility  Outcome Measure Help needed turning from your back to your side while in a flat bed without using bedrails?: A Little Help needed moving from lying on your back to sitting on the side of a flat bed without using bedrails?: A Little Help needed moving to and from a bed to a chair (including a wheelchair)?: A Little Help needed standing up from a chair using your arms (e.g., wheelchair or bedside chair)?: A Lot Help needed to walk in hospital room?: A Little Help needed climbing 3-5 steps with a railing? : Total 6 Click Score: 15    End of Session Equipment Utilized During Treatment: Gait belt Activity Tolerance: Patient tolerated treatment well Patient left: in bed;with call bell/phone within reach Nurse Communication: Mobility status PT Visit Diagnosis: Unsteadiness on feet (R26.81);Difficulty in walking, not elsewhere classified (R26.2)    Time: 1607-3710 PT Time Calculation (min) (ACUTE ONLY): 24 min   Charges:   PT Evaluation $  PT Eval Low Complexity: 1 Low PT Treatments $Therapeutic Activity: 8-22 mins        Lillia Pauls, PT, DPT Acute Rehabilitation Services Office [phone]   Norval Morton 05/07/2022, 9:14 AM

## 2022-05-07 NOTE — ED Notes (Signed)
This RN and another tech assisted pt to bathroom via wheelchair to obtain urine sample, but pt became combative and refused to void. Placed back in stretcher, will have to attempt in & out

## 2022-05-07 NOTE — ED Provider Notes (Signed)
[facility] EMERGENCY DEPARTMENT Provider Note   CSN: [id number] Arrival date & time: 05/06/22  1811     History {Add pertinent medical, surgical, social history, OB history to HPI:1} Chief Complaint  Patient presents with   Loss of Consciousness    Serayah Lyle Leisner is a 87 y.o. female.  87 year old female with nearing end-stage Lewy body dementia that presents the ER today with her daughter secondary to episodes of decreased responsiveness.  Apparently patient usually can take a nap and wake up and be back to baseline pretty quickly today she had an episode where she was very somnolent and she would fall asleep shortly after being woken up and the daughter said that look like she also had some facial droop but it was bilateral.  She states that this is not normal for the patient and she was concerned with her history of called EMS.  Of EMS her blood pressure dropped a little bit when she stood up but only about 15 points per the daughter's report.  She thinks that she may be dehydrated since the patient has had decreased appetite over the last few months.  She has been considering placement for a while now.   Loss of Consciousness      Home Medications Prior to Admission medications   Medication Sig Start Date End Date Taking? Authorizing Provider  amLODipine (NORVASC) 10 MG tablet Take 1 tablet by mouth once daily 03/09/22   Frederica Kuster, MD  divalproex (DEPAKOTE) 125 MG DR tablet Take 1 tablet (125 mg total) by mouth 2 (two) times daily. 04/08/22   Marcos Eke, PA-C  lisinopril (ZESTRIL) 10 MG tablet Take 1 tablet (10 mg total) by mouth daily. 05/04/22   Frederica Kuster, MD  NOVOLIN 70/30 RELION (70-30) 100 UNIT/ML injection 25 Units. 02/17/20   [provider]      Allergies    Patient has no known allergies.    Review of Systems   Review of Systems  Cardiovascular:  Positive for syncope.    Physical Exam Updated Vital  Signs BP 137/75   Pulse 62   Temp 97.9 F (36.6 C)   Resp 18   SpO2 99%  Physical Exam Vitals and nursing note reviewed.  Constitutional:      Appearance: She is well-developed.  HENT:     Head: Normocephalic and atraumatic.  Eyes:     Pupils: Pupils are equal, round, and reactive to light.  Cardiovascular:     Rate and Rhythm: Normal rate and regular rhythm.  Pulmonary:     Effort: No respiratory distress.     Breath sounds: No stridor.  Abdominal:     General: Abdomen is flat. There is no distension.  Musculoskeletal:        General: No swelling or tenderness. Normal range of motion.     Cervical back: Normal range of motion.  Skin:    General: Skin is warm and dry.  Neurological:     General: No focal deficit present.     Mental Status: She is alert.     ED Results / Procedures / Treatments   Labs (all labs ordered are listed, but only abnormal results are displayed) Labs Reviewed  COMPREHENSIVE METABOLIC PANEL - Abnormal; Notable for the following components:      Result Value   CO2 21 (*)    Creatinine, Ser 1.07 (*)    GFR, Estimated 51 (*)    All other components within normal  limits  CBC WITH DIFFERENTIAL/PLATELET  URINALYSIS, ROUTINE W REFLEX MICROSCOPIC  MAGNESIUM  TROPONIN I (HIGH SENSITIVITY)  TROPONIN I (HIGH SENSITIVITY)    EKG EKG Interpretation  Date/Time:  Friday May 06 2022 18:18:53 EST Ventricular Rate:  66 PR Interval:  166 QRS Duration: 82 QT Interval:  396 QTC Calculation: 415 R Axis:   -16 Text Interpretation: Normal sinus rhythm Moderate voltage criteria for LVH, may be normal variant ( R in aVL , Cornell product ) Anterolateral infarct , age undetermined Abnormal ECG When compared with ECG of 24-Jun-2019 23:05, PREVIOUS ECG IS PRESENT Confirmed by Marily Memos [phone]) on 05/07/2022 5:13:40 AM  Radiology CT Head Wo Contrast  Result Date: 05/06/2022 CLINICAL DATA:  Mental status change EXAM: CT HEAD WITHOUT CONTRAST  TECHNIQUE: Contiguous axial images were obtained from the base of the skull through the vertex without intravenous contrast. RADIATION DOSE REDUCTION: This exam was performed according to the departmental dose-optimization program which includes automated exposure control, adjustment of the mA and/or kV according to patient size and/or use of iterative reconstruction technique. COMPARISON:  Head CT 06/25/2019 FINDINGS: Brain: No evidence of acute infarction, hemorrhage, hydrocephalus, extra-axial collection or mass lesion/mass effect. There is mild diffuse atrophy, unchanged. There is stable mild periventricular white matter hypodensity, likely chronic small vessel ischemic change. Vascular: Atherosclerotic calcifications are present within the cavernous internal carotid arteries. Skull: Normal. Negative for fracture or focal lesion. Sinuses/Orbits: No acute finding. Other: None. IMPRESSION: 1. No acute intracranial process. 2. Stable mild atrophy and chronic small vessel ischemic changes. Electronically Signed   By: Darliss Cheney M.D.   On: 05/06/2022 19:59    Procedures Procedures    Medications Ordered in ED Medications  lactated ringers bolus 1,000 mL (has no administration in time range)    ED Course/ Medical Decision Making/ A&P                           Medical Decision Making Amount and/or Complexity of Data Reviewed Labs: ordered. Radiology: ordered.  87 year old female with likely mild dehydration and advanced body dementia senting with generalized weakness basically.  Low suspicion for stroke.  I suspect it is dehydration related to decreased intake.  Daughter feels like she is getting to be unsafe at home and no one lives with her.  She is taken care of by her daughters quite a bit during the day so he can go home if needed but daughter is requesting assistance from social work to try and help with paperwork and what to do to get her placed.  She pending urinalysis, fluids and  reevaluation. ***  {Document critical care time when appropriate:1} {Document review of labs and clinical decision tools ie heart score, Chads2Vasc2 etc:1}  {Document your independent review of radiology images, and any outside records:1} {Document your discussion with family members, caretakers, and with consultants:1} {Document social determinants of health affecting pt's care:1} {Document your decision making why or why not admission, treatments were needed:1} Final Clinical Impression(s) / ED Diagnoses Final diagnoses:  None    Rx / DC Orders ED Discharge Orders     None

## 2022-05-07 NOTE — ED Provider Notes (Signed)
I reevaluated the stoma at bedside. Her symptoms are more consistent with fatigue and increased somnolence as well as failure to thrive in the outpatient setting.  Fortunately she has strong care network between her daughters at bedside. Her daughters are requesting social work consultation for recommendations on ongoing care and management.  I discussed the case with social work.  Patient needs to be connected with Medicaid, facilities in the outpatient setting.  She likely has a urinary tract infection at this time based on her lab workup.  She will get antibiotics and is otherwise stable for outpatient care management.  Fortunately, PT evaluation this morning is grossly reassuring with no focal pathology appreciated. Reviewed prior urine cultures with no evidence of resistant flora.   Glyn Ade, MD 05/07/22 1118

## 2022-05-07 NOTE — ED Notes (Signed)
PT at bedside.

## 2022-05-11 ENCOUNTER — Telehealth: Payer: Self-pay | Admitting: *Deleted

## 2022-05-11 NOTE — Telephone Encounter (Signed)
     Patient  visit on 05/07/2022  at [facility]  was for uti  Have you been able to follow up with your primary care physician? Is taking antibiotics and will PCP in jan on the 3rd , daughter has all she needs at this time   The patient was  able to obtain any needed medicine or equipment. Yes   Are there diet recommendations that you are having difficulty following?na   Patient expresses understanding of discharge instructions and education provided has no other needs at this time.    Yehuda Mao Greenauer -[facility] [place], [facility] [phone] [address] [place] , [place] [address] Email : [email or web address]

## 2022-05-18 ENCOUNTER — Ambulatory Visit: Payer: Medicare HMO | Admitting: Family Medicine

## 2022-05-25 ENCOUNTER — Ambulatory Visit (INDEPENDENT_AMBULATORY_CARE_PROVIDER_SITE_OTHER): Payer: Medicare HMO | Admitting: Family Medicine

## 2022-05-25 ENCOUNTER — Encounter: Payer: Self-pay | Admitting: Family Medicine

## 2022-05-25 VITALS — BP 128/74 | HR 65 | Temp 96.7°F | Ht 61.0 in | Wt 127.4 lb

## 2022-05-25 DIAGNOSIS — R69 Illness, unspecified: Secondary | ICD-10-CM | POA: Diagnosis not present

## 2022-05-25 DIAGNOSIS — G3183 Dementia with Lewy bodies: Secondary | ICD-10-CM

## 2022-05-25 DIAGNOSIS — R443 Hallucinations, unspecified: Secondary | ICD-10-CM

## 2022-05-25 DIAGNOSIS — F02818 Dementia in other diseases classified elsewhere, unspecified severity, with other behavioral disturbance: Secondary | ICD-10-CM

## 2022-05-25 DIAGNOSIS — E1140 Type 2 diabetes mellitus with diabetic neuropathy, unspecified: Secondary | ICD-10-CM

## 2022-05-25 DIAGNOSIS — I10 Essential (primary) hypertension: Secondary | ICD-10-CM

## 2022-05-25 DIAGNOSIS — Z794 Long term (current) use of insulin: Secondary | ICD-10-CM | POA: Diagnosis not present

## 2022-05-25 NOTE — Progress Notes (Signed)
Provider:  Alain Honey, MD  Careteam: Patient Care Team: Wardell Honour, MD as PCP - General (Family Medicine) Cameron Sprang, MD as Consulting Physician (Neurology) Sherryle Lis Stephan Minister, DPM as Consulting Physician (Podiatry) Shawn Route, Nancy Marus (Neurology)  PLACE OF SERVICE:  [facility] Directive information    No Known Allergies  Chief Complaint  Patient presents with   Acute Visit    Patient presents today for a follow-up from ED visit. She reports feeling a lot better.     HPI: Patient is a 88 y.o. [name] .  Patient is seen following ER visit on 1216 for syncopal episode.  Feeling at that time was that she had a urinary tract infection and she was treated with a 10-day course of cephalexin.  Today she is reporting no symptoms.  I do not see a culture and sensitivity having been done at that visit but clinically, she is doing well.  She continues to have some hallucinations but they are not the main topic of discussion today as they have been on previous visits.  Voices have told her some things that are not always in her best health interest such as do not take medicine or skip meals.  Appetite is stable as his weight.  She reports to sleeping better. She has had a visit with neurology and scored a perfect score on her memory test. Her diabetes is doing well with most recent A1c of 6.6 on 25 units of NovoLog 70/30  Review of Systems:  Review of Systems  Constitutional: Negative.   HENT: Negative.    Respiratory: Negative.    Cardiovascular: Negative.   Genitourinary: Negative.   Neurological: Negative.   Psychiatric/Behavioral:  Positive for hallucinations.   All other systems reviewed and are negative.   Past Medical History:  Diagnosis Date   Coronary artery disease    Dementia ([place])    Diabetes mellitus    Glaucoma    Hallucination    Heart disease    History of heart artery stent 2009   Hypertension    Memory loss    just started on  aricept   Mild cognitive impairment    Osteoarthritis    Phobia    Psychosis ([place])    Stroke ([place])    Vision abnormalities    Past Surgical History:  Procedure Laterality Date   CORONARY ANGIOPLASTY WITH STENT PLACEMENT     CORONARY STENT PLACEMENT     Social History:   reports that she quit smoking about 58 years ago. Her smoking use included cigarettes. She has never used smokeless tobacco. She reports that she does not drink alcohol and does not use drugs.  Family History  Problem Relation Age of Onset   Hypertension Mother    Diabetes Mother    Hypertension Father    Diabetes Father    Hypertension Sister    Diabetes Sister    Kidney disease Sister    Liver disease Brother    Hypertension Daughter    Fibromyalgia Daughter    Rheum arthritis Daughter    Breast cancer Daughter    Hypertension Son    Seizures Granddaughter    Epilepsy Granddaughter     Medications: Patient's Medications  New Prescriptions   No medications on file  Previous Medications   AMLODIPINE (NORVASC) 10 MG TABLET    Take 1 tablet by mouth once daily   ASPIRIN 81 PO    Take 81 mg by mouth 2 (two)  times a week.   CEPHALEXIN (KEFLEX) 500 MG CAPSULE    Take 1 capsule (500 mg total) by mouth 2 (two) times daily.   DIVALPROEX (DEPAKOTE) 125 MG DR TABLET    Take 1 tablet (125 mg total) by mouth 2 (two) times daily.   LISINOPRIL (ZESTRIL) 10 MG TABLET    Take 1 tablet (10 mg total) by mouth daily.   NOVOLIN 70/30 RELION (70-30) 100 UNIT/ML INJECTION    25 Units.  Modified Medications   No medications on file  Discontinued Medications   No medications on file    Physical Exam:  Vitals:   05/25/22 0855  BP: 128/74  Pulse: 65  Temp: (!) 96.7 F (35.9 C)  SpO2: 96%  Weight: 127 lb 6.4 oz (57.8 kg)  Height: 5\' 1"  (1.549 m)   Body mass index is 24.07 kg/m. Wt Readings from Last 3 Encounters:  05/25/22 127 lb 6.4 oz (57.8 kg)  05/04/22 126 lb 6.4 oz (57.3 kg)  04/08/22 126 lb (57.2 kg)     Physical Exam Vitals and nursing note reviewed.  Constitutional:      Appearance: Normal appearance.  Cardiovascular:     Rate and Rhythm: Normal rate and regular rhythm.  Pulmonary:     Effort: Pulmonary effort is normal.     Breath sounds: Normal breath sounds.  Musculoskeletal:        General: Normal range of motion.  Neurological:     General: No focal deficit present.     Mental Status: She is alert and oriented to person, place, and time.     Labs reviewed: Basic Metabolic Panel: Recent Labs    09/28/21 0954 05/06/22 1836  NA 138 138  K 4.5 3.8  CL 103 106  CO2 26 21*  GLUCOSE 144* 98  BUN 20 20  CREATININE 1.00* 1.07*  CALCIUM 9.7 9.2   Liver Function Tests: Recent Labs    05/06/22 1836  AST 24  ALT 14  ALKPHOS 71  BILITOT 0.4  PROT 7.5  ALBUMIN 3.9   No results for input(s): "LIPASE", "AMYLASE" in the last 8760 hours. No results for input(s): "AMMONIA" in the last 8760 hours. CBC: Recent Labs    05/06/22 1836  WBC 6.7  NEUTROABS 3.9  HGB 14.1  HCT 42.4  MCV 88.9  PLT 260   Lipid Panel: No results for input(s): "CHOL", "HDL", "LDLCALC", "TRIG", "CHOLHDL", "LDLDIRECT" in the last 8760 hours. TSH: No results for input(s): "TSH" in the last 8760 hours. A1C: Lab Results  Component Value Date   HGBA1C 6.6 (H) 05/04/2022     Assessment/Plan 1. Dementia, Lewy body with behavior disturbance (HCC) No decline in memory hallucinations seem to be not as prominent  2. Essential hypertension Blood pressures are normal on combination amlodipine and lisinopril  3. HALLUCINATION Continue with Depakote and reassurance   4. Type 2 diabetes mellitus with diabetic neuropathy, with long-term current use of insulin (HCC) Continue with Novolin 70/30.  Will recheck her in 3 months at which time we will do A1c   Alain Honey, MD [place] [phone]

## 2022-06-23 IMAGING — DX DG CHEST 1V PORT
1 series · 1 of 1 positions shown · non-contrast
Comparison: 10/13/2016

CLINICAL DATA: Dementia, confusion, hallucinations

EXAM:
PORTABLE CHEST 1 VIEW

[chest ap]
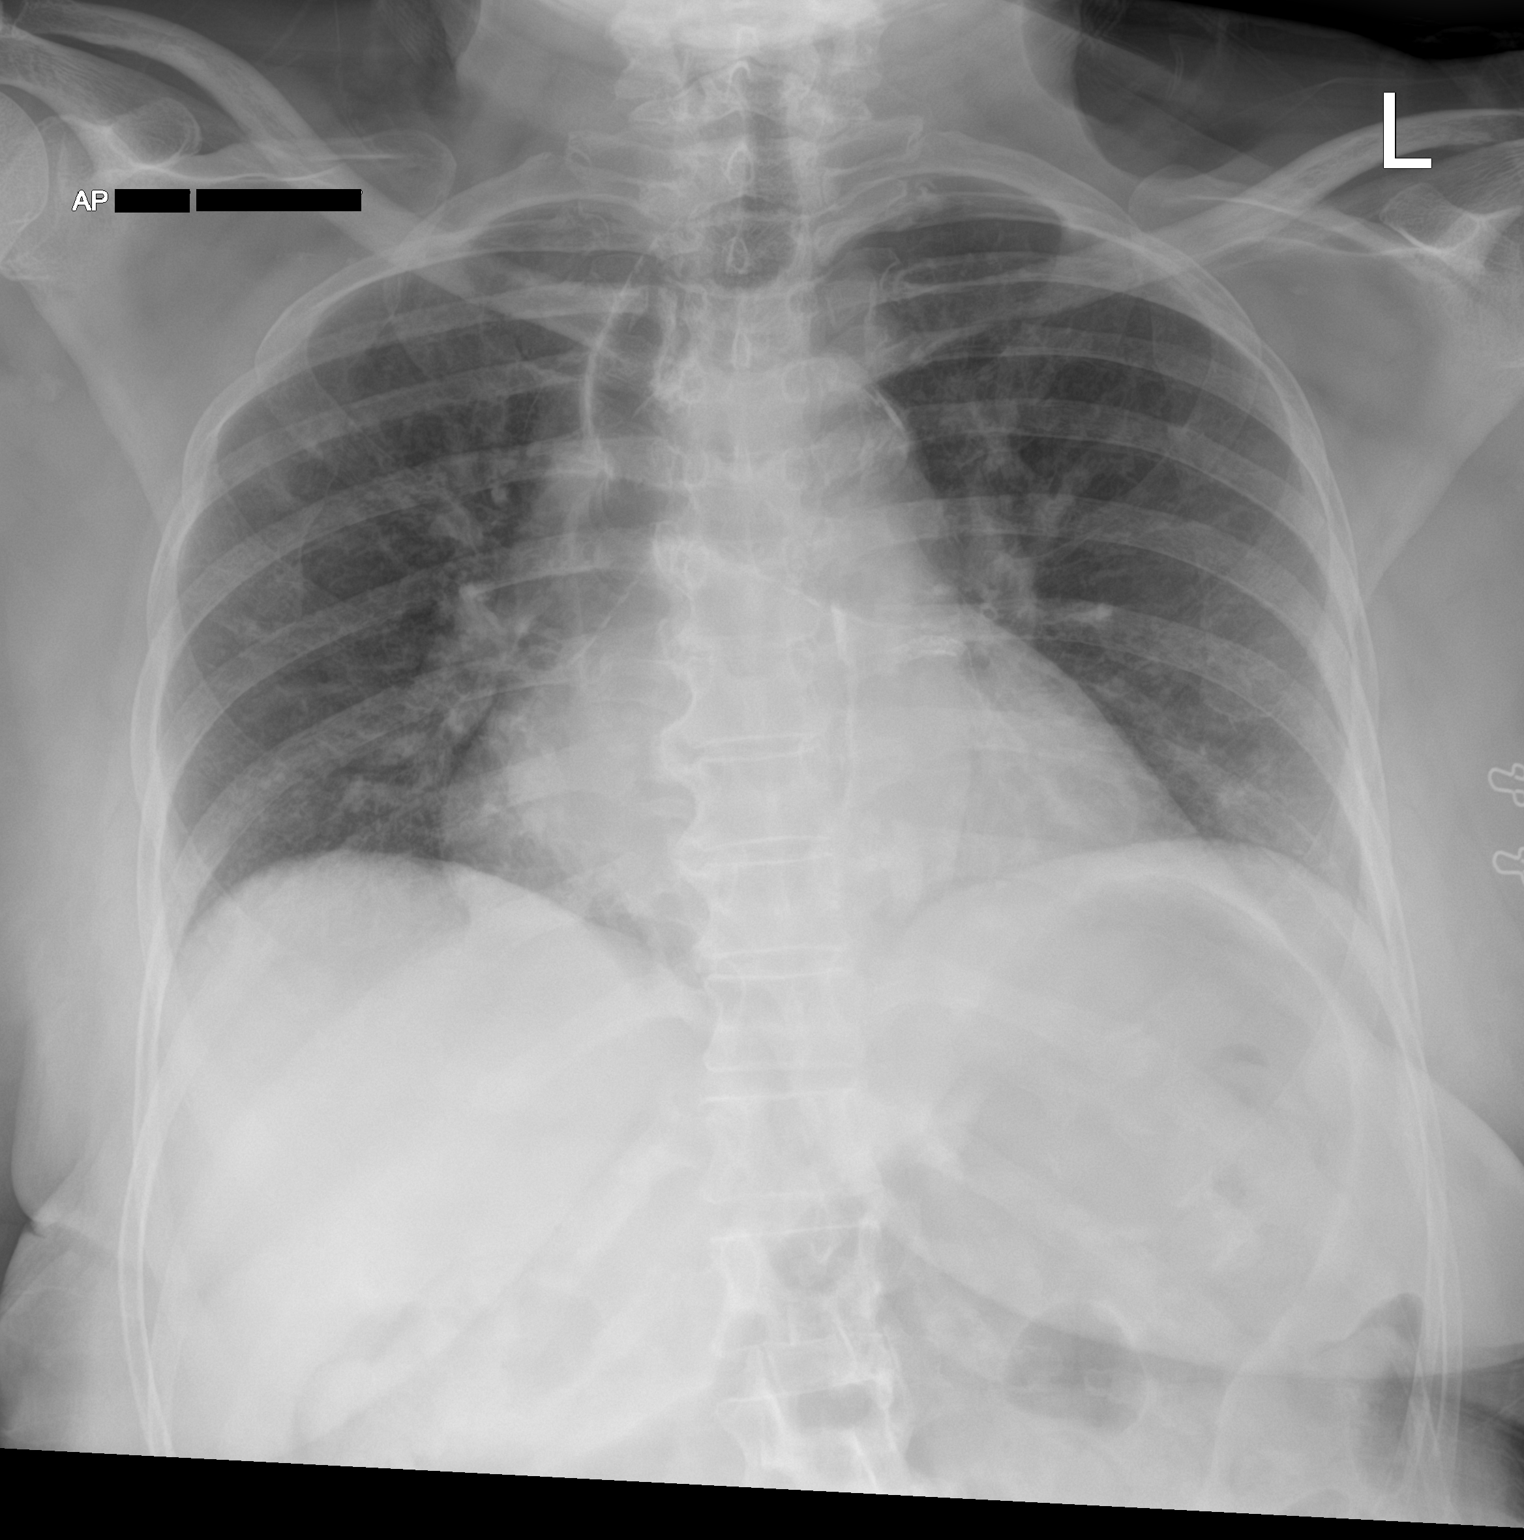

[1 of 1 positions shown; findings below may reference images not displayed]

FINDINGS: Lungs volumes are small, but are stable since prior examination.
Several peripheral nodular opacities are seen withinq the right
upper lung zone which are nonspecific, but may be infectious or
inflammatory in the acute setting. No pneumothorax or pleural
effusion. Cardiac size within normal limits. Pulmonary vascularity
is normal. Osseous structures are age-appropriate. No acute bone
abnormality.
IMPRESSION: Pulmonary hypoinflation.

Nodular opacity in the right upper lung zone, possibly infectious or
inflammatory. A follow-up standard two view chest radiograph would
be helpful in 3-4 weeks to document resolution

## 2022-06-29 ENCOUNTER — Ambulatory Visit (INDEPENDENT_AMBULATORY_CARE_PROVIDER_SITE_OTHER): Payer: Medicare HMO | Admitting: Family

## 2022-06-29 ENCOUNTER — Encounter: Payer: Self-pay | Admitting: Family

## 2022-06-29 VITALS — BP 120/80 | HR 62 | Temp 98.1°F | Resp 18 | Ht 61.0 in | Wt 126.5 lb

## 2022-06-29 DIAGNOSIS — R197 Diarrhea, unspecified: Secondary | ICD-10-CM

## 2022-06-29 DIAGNOSIS — I10 Essential (primary) hypertension: Secondary | ICD-10-CM

## 2022-06-29 MED ORDER — DIVALPROEX SODIUM 125 MG PO DR TAB: 125.0000 mg | DELAYED_RELEASE_TABLET | Freq: Two times a day (BID) | ORAL | 4 refills | Status: DC

## 2022-06-29 MED ORDER — LISINOPRIL 10 MG PO TABS: 10.0000 mg | ORAL_TABLET | Freq: Every day | ORAL | 3 refills | Status: DC

## 2022-06-29 NOTE — Progress Notes (Signed)
Provider: Kartier Bennison FNP-C  Wardell Honour, MD  Patient Care Team: Wardell Honour, MD as PCP - General (Family Medicine) Cameron Sprang, MD as Consulting Physician (Neurology) Criselda Peaches, DPM as Consulting Physician (Podiatry) Shawn Route Nancy Marus (Neurology)  Extended Emergency Contact Information Primary Emergency Contact: Clancy Gourd, [place] Phone: [phone] Relation: Daughter Secondary Emergency Contact: Gracelyn Nurse, [place] of [place] Phone: [phone] Relation: Daughter  Code Status:  Full Code  Goals of care: Advanced Directive information    04/08/2022    1:00 PM  Advanced Directives  Does Patient Have a Medical Advance Directive? Yes     Chief Complaint  Patient presents with   Acute Visit    Diarrhea and feeling weak.    HPI:  Pt is a 88 y.o. female seen today for an acute visit for evaluation of diarrhea x 1 states feeling better today. Not sure if she eat something that could have caused the diarrhea. CBG after meals was 136 today.she denies any fever,chills,nausea,vomiting,abdominal pain,flank pain,urgency,frequency,dysuria,difficult urination or hematuria.  Past Medical History:  Diagnosis Date   Coronary artery disease    Dementia ([place])    Diabetes mellitus    Glaucoma    Hallucination    Heart disease    History of heart artery stent 2009   Hypertension    Memory loss    just started on aricept   Mild cognitive impairment    Osteoarthritis    Phobia    Psychosis ([place])    Stroke ([place])    Vision abnormalities    Past Surgical History:  Procedure Laterality Date   CORONARY ANGIOPLASTY WITH STENT PLACEMENT     CORONARY STENT PLACEMENT      No Known Allergies  Outpatient Encounter Medications as of 06/29/2022  Medication Sig   amLODipine (NORVASC) 10 MG tablet Take 1 tablet by mouth once daily   ASPIRIN 81 PO Take 81 mg by mouth 2  (two) times a week.   cephALEXin (KEFLEX) 500 MG capsule Take 1 capsule (500 mg total) by mouth 2 (two) times daily.   NOVOLIN 70/30 RELION (70-30) 100 UNIT/ML injection 25 Units.   [DISCONTINUED] divalproex (DEPAKOTE) 125 MG DR tablet Take 1 tablet (125 mg total) by mouth 2 (two) times daily.   [DISCONTINUED] lisinopril (ZESTRIL) 10 MG tablet Take 1 tablet (10 mg total) by mouth daily.   divalproex (DEPAKOTE) 125 MG DR tablet Take 1 tablet (125 mg total) by mouth 2 (two) times daily.   lisinopril (ZESTRIL) 10 MG tablet Take 1 tablet (10 mg total) by mouth daily.   No facility-administered encounter medications on file as of 06/29/2022.    Review of Systems  Constitutional:  Negative for appetite change, chills, fatigue, fever and unexpected weight change.  HENT:  Negative for sinus pressure, sinus pain, sneezing, sore throat, tinnitus and trouble swallowing.   Eyes:  Negative for pain, discharge, redness, itching and visual disturbance.  Respiratory:  Negative for cough, chest tightness, shortness of breath and wheezing.   Cardiovascular:  Negative for chest pain, palpitations and leg swelling.  Gastrointestinal:  Negative for abdominal distention, abdominal pain, blood in stool, constipation, nausea and vomiting.       Had diarrhea yesterday but has resolved.  Genitourinary:  Negative for difficulty urinating, dysuria, flank pain, frequency and urgency.  Musculoskeletal:  Positive for  gait problem. Negative for arthralgias, back pain, joint swelling, myalgias, neck pain and neck stiffness.  Skin:  Negative for color change, pallor, rash and wound.  Neurological:  Negative for dizziness, syncope, speech difficulty, weakness, light-headedness, numbness and headaches.  Hematological:  Does not bruise/bleed easily.  Psychiatric/Behavioral:  Negative for agitation, behavioral problems, confusion, hallucinations and sleep disturbance. The patient is not nervous/anxious.     Immunization History   Administered Date(s) Administered   Fluad Quad(high Dose 65+) 04/27/2021, 05/04/2022   PFIZER Comirnaty(Gray Top)Covid-19 Tri-Sucrose Vaccine 10/12/2020   PFIZER(Purple Top)SARS-COV-2 Vaccination 09/05/2019, 09/30/2019   Pfizer Covid-19 Vaccine Bivalent Booster 5yrs & up 04/14/2021   Pertinent  Health Maintenance Due  Topic Date Due   OPHTHALMOLOGY EXAM  Never done   HEMOGLOBIN A1C  11/03/2022   FOOT EXAM  05/26/2023   INFLUENZA VACCINE  Completed   DEXA SCAN  Completed      04/08/2022    1:00 PM 05/04/2022    2:37 PM 05/06/2022    6:13 PM 05/07/2022    8:22 AM 06/29/2022    4:09 PM  Fall Risk  Falls in the past year? 0 0   0  Was there an injury with Fall? 0 0   0  Fall Risk Category Calculator 0 0   0  Fall Risk Category (Retired) Low Low     (RETIRED) Patient Fall Risk Level Low fall risk Low fall risk High fall risk High fall risk   Patient at Risk for Falls Due to  No Fall Risks   No Fall Risks  Fall risk Follow up Falls evaluation completed Falls evaluation completed   Falls evaluation completed   Functional Status Survey:    Vitals:   06/29/22 1605  BP: 120/80  Pulse: 62  Resp: 18  Temp: 98.1 F (36.7 C)  SpO2: 99%  Weight: 126 lb 8 oz (57.4 kg)  Height: 5\' 1"  (1.549 m)   Body mass index is 23.9 kg/m. Physical Exam Vitals reviewed.  Constitutional:      General: She is not in acute distress.    Appearance: Normal appearance. She is normal weight. She is not ill-appearing or diaphoretic.  HENT:     Head: Normocephalic.     Nose: Nose normal. No congestion or rhinorrhea.     Mouth/Throat:     Mouth: Mucous membranes are moist.     Pharynx: Oropharynx is clear. No oropharyngeal exudate or posterior oropharyngeal erythema.  Eyes:     General: No scleral icterus.       Right eye: No discharge.        Left eye: No discharge.     Conjunctiva/sclera: Conjunctivae normal.     Pupils: Pupils are equal, round, and reactive to light.  Neck:     Vascular: No  carotid bruit.  Cardiovascular:     Rate and Rhythm: Normal rate and regular rhythm.     Pulses: Normal pulses.     Heart sounds: Normal heart sounds. No murmur heard.    No friction rub. No gallop.  Pulmonary:     Effort: Pulmonary effort is normal. No respiratory distress.     Breath sounds: Normal breath sounds. No wheezing, rhonchi or rales.  Chest:     Chest wall: No tenderness.  Abdominal:     General: Bowel sounds are normal. There is no distension.     Palpations: Abdomen is soft. There is no mass.     Tenderness: There is no abdominal tenderness. There is  no right CVA tenderness, left CVA tenderness, guarding or rebound.  Musculoskeletal:        General: No swelling or tenderness. Normal range of motion.     Cervical back: Normal range of motion. No rigidity or tenderness.     Right lower leg: No edema.     Left lower leg: No edema.     Comments: Unsteady gait   Lymphadenopathy:     Cervical: No cervical adenopathy.  Skin:    General: Skin is warm and dry.     Coloration: Skin is not pale.     Findings: No bruising, erythema, lesion or rash.  Neurological:     Mental Status: She is alert and oriented to person, place, and time.     Cranial Nerves: No cranial nerve deficit.     Sensory: No sensory deficit.     Motor: No weakness.     Coordination: Coordination normal.     Gait: Gait abnormal.  Psychiatric:        Mood and Affect: Mood normal.        Speech: Speech normal.        Behavior: Behavior normal.     Labs reviewed: Recent Labs    09/28/21 0954 05/06/22 1836  NA 138 138  K 4.5 3.8  CL 103 106  CO2 26 21*  GLUCOSE 144* 98  BUN 20 20  CREATININE 1.00* 1.07*  CALCIUM 9.7 9.2   Recent Labs    05/06/22 1836  AST 24  ALT 14  ALKPHOS 71  BILITOT 0.4  PROT 7.5  ALBUMIN 3.9   Recent Labs    05/06/22 1836  WBC 6.7  NEUTROABS 3.9  HGB 14.1  HCT 42.4  MCV 88.9  PLT 260   No results found for: "TSH" Lab Results  Component Value Date    HGBA1C 6.6 (H) 05/04/2022   No results found for: "CHOL", "HDL", "LDLCALC", "LDLDIRECT", "TRIG", "CHOLHDL"  Significant Diagnostic Results in last 30 days:  No results found.  Assessment/Plan  1. Essential hypertension B/p well controlled  - continue on lisinopril and amlodipine  - lisinopril (ZESTRIL) 10 MG tablet; Take 1 tablet (10 mg total) by mouth daily.  Dispense: 90 tablet; Refill: 3  2. Diarrhea, unspecified type Had diarrhea x 1 episode yesterday but has resolved Bland diet recommended and advance as tolerated.diet information provided on AVS  - Encourage to increase fluid intake   - advised to notify provider if diarrhea recurs. - OTC imodium as needed    Family/ staff Communication: Reviewed plan of care with patient and daughter verbalized understanding   Labs/tests ordered: None   Next Appointment: Return if symptoms worsen or fail to improve.   Sandrea Hughs, NP

## 2022-06-29 NOTE — Patient Instructions (Signed)
Bland Diet A bland diet may consist of soft foods or foods that are not high in fat or are not greasy, acidic, or spicy. Avoiding certain foods may cause less irritation to your mouth, throat, stomach, or gastrointestinal tract. Avoiding certain foods may make you feel better. Everyone's tolerances are different. A bland diet should be based on what you can tolerate and what may cause discomfort. What is my plan? Your health care provider or dietitian may recommend specific changes to your diet to treat your symptoms. These changes may include: Eating small meals frequently. Cooking food until it is soft enough to chew easily. Taking the time to chew your food thoroughly, so it is easy to swallow and digest. Avoiding foods that cause you discomfort. These may include spicy food, fried food, greasy foods, hard-to-chew foods, or citrus fruits and juices. Drinking slowly. What are tips for following this plan? Reading food labels To reduce fiber intake, look for food labels that say "whole," such as whole wheat or whole grain. Shopping Avoid food items that may have nuts or seeds. Avoid vegetables that may make you gassy or have a tough texture, such as broccoli, cauliflower, or corn. Cooking Cook foods thoroughly so they have a soft texture. Meal planning Make sure you include foods from all food groups to eat a balanced diet. Eat a variety of types of foods. Eat foods and drink beverages that do not cause you discomfort. These may include soups and broths with cooked meats, pasta, and vegetables. Lifestyle Sit up after meals, avoid tight clothing, and take time to eat and chew your food slowly. Ask your health care provider whether you should take dietary supplements. General information Mildly season your foods. Some seasonings, such as cayenne pepper, vinegar, or hot sauce, may cause irritation. The foods, beverages, or seasonings to avoid should be based on individual tolerance. What  foods should I eat? Fruits Canned or cooked fruit such as peaches, pears, or applesauce. Bananas. Vegetables Well-cooked vegetables. Canned or cooked vegetables such as carrots, green beans, beets, or spinach. Mashed or boiled potatoes. Grains  Hot cereals, such as cream of wheat and processed oatmeal. Rice. Bread, crackers, pasta, or tortillas made from refined white flour. Meats and other proteins  Eggs. Creamy peanut butter or other nut butters. Lean, well-cooked tender meats, such as beef, pork, chicken, or fish. Dairy Low-fat dairy products such as milk, cottage cheese, or yogurt. Beverages  Water. Herbal tea. Apple juice. Fats and oils Mild salad dressings. Canola or olive oil. Sweets and desserts Low-fat pudding, custard, or ice cream. Fruit gelatin. The items listed above may not be a complete list of foods and beverages you can eat. Contact a dietitian for more information. What foods should I avoid? Fruits Citrus fruits, such as oranges and grapefruit. Fruits with a stringy texture. Fruits that have lots of seeds, such as kiwi or strawberries. Dried fruits. Vegetables Raw, uncooked vegetables. Salads. Grains Whole grain breads, muffins, and cereals. Meats and other proteins Tough, fibrous meats. Highly seasoned meat such as corned beef, smoked meats, or fish. Processed high-fat meats such as brats, hot dogs, or sausage. Dairy Full-fat dairy foods such as ice cream and cheese. Beverages Caffeinated drinks. Alcohol. Seasonings and condiments Strongly flavored seasonings or condiments. Hot sauce. Salsa. Other foods Spicy foods. Fried or greasy foods. Sour foods, such as pickled or fermented foods like sauerkraut. Foods high in fiber. The items listed above may not be a complete list of foods and beverages you should   avoid. Contact a dietitian for more information. Summary A bland diet should be based on individual tolerance. It may consist of foods that are soft  textured and do not have a lot of fat, fiber, acid, or seasonings. A bland diet may be recommended because avoiding certain foods, beverages, or spices may make you feel better. This information is not intended to replace advice given to you by your health care provider. Make sure you discuss any questions you have with your health care provider. Document Revised: 03/29/2021 Document Reviewed: 03/29/2021 Elsevier Patient Education  2023 [employer]  

## 2022-07-06 ENCOUNTER — Other Ambulatory Visit (HOSPITAL_BASED_OUTPATIENT_CLINIC_OR_DEPARTMENT_OTHER): Payer: Self-pay

## 2022-07-06 ENCOUNTER — Ambulatory Visit (INDEPENDENT_AMBULATORY_CARE_PROVIDER_SITE_OTHER): Payer: Medicare HMO | Admitting: Family Medicine

## 2022-07-06 ENCOUNTER — Encounter: Payer: Self-pay | Admitting: Family Medicine

## 2022-07-06 VITALS — BP 130/84 | HR 57 | Temp 95.5°F | Ht 61.0 in | Wt 128.6 lb

## 2022-07-06 DIAGNOSIS — E1140 Type 2 diabetes mellitus with diabetic neuropathy, unspecified: Secondary | ICD-10-CM | POA: Diagnosis not present

## 2022-07-06 DIAGNOSIS — F29 Unspecified psychosis not due to a substance or known physiological condition: Secondary | ICD-10-CM | POA: Diagnosis not present

## 2022-07-06 DIAGNOSIS — Z794 Long term (current) use of insulin: Secondary | ICD-10-CM

## 2022-07-06 DIAGNOSIS — R69 Illness, unspecified: Secondary | ICD-10-CM | POA: Diagnosis not present

## 2022-07-06 MED ORDER — COMIRNATY 30 MCG/0.3ML IM SUSY
PREFILLED_SYRINGE | INTRAMUSCULAR | 0 refills | Status: DC
  Filled 2022-07-06: qty 0.3, 1d supply, fill #0

## 2022-07-06 NOTE — Progress Notes (Signed)
Provider:  Alain Honey, MD  Careteam: Patient Care Team: Wardell Honour, MD as PCP - General (Family Medicine) Cameron Sprang, MD as Consulting Physician (Neurology) Sherryle Lis Stephan Minister, DPM as Consulting Physician (Podiatry) Shawn Route, Nancy Marus (Neurology)  PLACE OF SERVICE:  [place] Directive information    No Known Allergies  Chief Complaint  Patient presents with   Acute Visit    Patient presents today for possible skilled nursing placement.      HPI: Patient is a 88 y.o. female with history of Lewy body dementia.  She is followed by neurology and they have been regulating her Depakote.  Currently on 250 twice daily up from 125.  This was necessitated by increasing hallucinations.  The hallucinations seem to have a strong voice and whether or not she for example takes her medicines days gets dressed etc. her MMSE has consistently been normal, most recent being 30/30.  She is accompanied as usual by her daughter who has moved to [facility] and is having increasing difficulty overseeing Ruqayya's care. She presents today to help fill out [place] 2 form to begin shopping for memory care facilities either in [place] or [place].  Review of Systems:  Review of Systems  Constitutional: Negative.   Respiratory: Negative.    Cardiovascular: Negative.   Genitourinary:  Positive for frequency.  Neurological:  Positive for loss of consciousness.  Psychiatric/Behavioral:  Positive for hallucinations.   All other systems reviewed and are negative.   Past Medical History:  Diagnosis Date   Coronary artery disease    Dementia ([place])    Diabetes mellitus    Glaucoma    Hallucination    Heart disease    History of heart artery stent 2009   Hypertension    Memory loss    just started on aricept   Mild cognitive impairment    Osteoarthritis    Phobia    Psychosis ([place])    Stroke ([place])    Vision abnormalities    Past Surgical  History:  Procedure Laterality Date   CORONARY ANGIOPLASTY WITH STENT PLACEMENT     CORONARY STENT PLACEMENT     Social History:   reports that she quit smoking about 58 years ago. Her smoking use included cigarettes. She has never used smokeless tobacco. She reports that she does not drink alcohol and does not use drugs.  Family History  Problem Relation Age of Onset   Hypertension Mother    Diabetes Mother    Hypertension Father    Diabetes Father    Hypertension Sister    Diabetes Sister    Kidney disease Sister    Liver disease Brother    Hypertension Daughter    Fibromyalgia Daughter    Rheum arthritis Daughter    Breast cancer Daughter    Hypertension Son    Seizures Granddaughter    Epilepsy Granddaughter     Medications: Patient's Medications  New Prescriptions   No medications on file  Previous Medications   ASPIRIN 81 PO    Take 81 mg by mouth 2 (two) times a week.   DIVALPROEX (DEPAKOTE) 125 MG DR TABLET    Take 1 tablet (125 mg total) by mouth 2 (two) times daily.   LISINOPRIL (ZESTRIL) 10 MG TABLET    Take 1 tablet (10 mg total) by mouth daily.   NOVOLIN 70/30 RELION (70-30) 100 UNIT/ML INJECTION    25 Units.  Modified Medications   No medications  on file  Discontinued Medications   AMLODIPINE (NORVASC) 10 MG TABLET    Take 1 tablet by mouth once daily   CEPHALEXIN (KEFLEX) 500 MG CAPSULE    Take 1 capsule (500 mg total) by mouth 2 (two) times daily.    Physical Exam:  Vitals:   07/06/22 0959  BP: 130/84  Pulse: (!) 57  Temp: (!) 95.5 F (35.3 C)  SpO2: 98%  Weight: 128 lb 9.6 oz (58.3 kg)  Height: 5' 1"$  (1.549 m)   Body mass index is 24.3 kg/m. Wt Readings from Last 3 Encounters:  07/06/22 128 lb 9.6 oz (58.3 kg)  06/29/22 126 lb 8 oz (57.4 kg)  05/25/22 127 lb 6.4 oz (57.8 kg)    Physical Exam Vitals and nursing note reviewed.  Constitutional:      Appearance: Normal appearance.  Cardiovascular:     Rate and Rhythm: Normal rate.   Pulmonary:     Breath sounds: Normal breath sounds.  Musculoskeletal:        General: Normal range of motion.  Neurological:     General: No focal deficit present.     Mental Status: She is alert and oriented to person, place, and time.  Psychiatric:        Mood and Affect: Mood normal.        Behavior: Behavior normal.     Labs reviewed: Basic Metabolic Panel: Recent Labs    09/28/21 0954 05/06/22 1836  NA 138 138  K 4.5 3.8  CL 103 106  CO2 26 21*  GLUCOSE 144* 98  BUN 20 20  CREATININE 1.00* 1.07*  CALCIUM 9.7 9.2   Liver Function Tests: Recent Labs    05/06/22 1836  AST 24  ALT 14  ALKPHOS 71  BILITOT 0.4  PROT 7.5  ALBUMIN 3.9   No results for input(s): "LIPASE", "AMYLASE" in the last 8760 hours. No results for input(s): "AMMONIA" in the last 8760 hours. CBC: Recent Labs    05/06/22 1836  WBC 6.7  NEUTROABS 3.9  HGB 14.1  HCT 42.4  MCV 88.9  PLT 260   Lipid Panel: No results for input(s): "CHOL", "HDL", "LDLCALC", "TRIG", "CHOLHDL", "LDLDIRECT" in the last 8760 hours. TSH: No results for input(s): "TSH" in the last 8760 hours. A1C: Lab Results  Component Value Date   HGBA1C 6.6 (H) 05/04/2022     Assessment/Plan Completed FL 2 with help of daughter and reviewing electronic medical record.  At this time I think it is appropriate for her to be in memory care not from the point of view of wandering but to help manage and for her safety.  The hallucinations seem to have an increasing voice in her activities and taking medicines such as insulin blood pressure medicines that are necessary for her living.     Alain Honey, MD [facility] [phone]

## 2022-07-22 ENCOUNTER — Ambulatory Visit: Payer: Medicare HMO | Admitting: Podiatry

## 2022-07-22 ENCOUNTER — Encounter: Payer: Self-pay | Admitting: Podiatry

## 2022-07-22 DIAGNOSIS — B351 Tinea unguium: Secondary | ICD-10-CM

## 2022-07-22 DIAGNOSIS — M79675 Pain in left toe(s): Secondary | ICD-10-CM | POA: Diagnosis not present

## 2022-07-22 DIAGNOSIS — M79674 Pain in right toe(s): Secondary | ICD-10-CM

## 2022-07-22 DIAGNOSIS — E1042 Type 1 diabetes mellitus with diabetic polyneuropathy: Secondary | ICD-10-CM | POA: Diagnosis not present

## 2022-07-22 NOTE — Progress Notes (Signed)
This patient returns to my office for at risk foot care.  This patient requires this care by a professional since this patient will be at risk due to having type 1 diabetes.  This patient is unable to cut nails herself since the patient cannot reach her nails.These nails are painful walking and wearing shoes. She presents to the office with her daughter.    This patient presents for at risk foot care today.  General Appearance  Alert, conversant and in no acute stress.  Vascular  Dorsalis pedis and posterior tibial  pulses are  weakly palpable  bilaterally.  Capillary return is within normal limits  bilaterally. Cold feet bilaterally. Absent digital hair  B/L.  Neurologic  Senn-Weinstein monofilament wire test within normal limits  bilaterally. Muscle power within normal limits bilaterally.  Nails Thick disfigured discolored nails with subungual debris  hallux  bilaterally. No evidence of bacterial infection or drainage bilaterally.  Orthopedic  No limitations of motion  feet .  No crepitus or effusions noted.  No bony pathology or digital deformities noted.  Skin  normotropic skin with no porokeratosis noted bilaterally.  No signs of infections or ulcers noted.     Onychomycosis  Pain in right toes  Pain in left toes  Consent was obtained for treatment procedures.   Mechanical debridement of nails 1-5  bilaterally performed with a nail nipper.  Filed with dremel without incident.    Return office visit    3 months                  Told patient to return for periodic foot care and evaluation due to potential at risk complications.   Gardiner Barefoot DPM

## 2022-08-04 ENCOUNTER — Ambulatory Visit: Payer: Medicare HMO | Admitting: Family

## 2022-08-12 ENCOUNTER — Other Ambulatory Visit: Payer: Self-pay

## 2022-08-12 MED ORDER — NOVOLIN 70/30 RELION (70-30) 100 UNIT/ML ~~LOC~~ SUSP: 25.0000 [IU] | Freq: Every day | SUBCUTANEOUS | 5 refills | Status: DC

## 2022-08-12 NOTE — Telephone Encounter (Signed)
Patient request refill on medication

## 2022-08-24 ENCOUNTER — Ambulatory Visit: Payer: Medicare HMO | Admitting: Family Medicine

## 2022-09-07 ENCOUNTER — Ambulatory Visit (INDEPENDENT_AMBULATORY_CARE_PROVIDER_SITE_OTHER): Payer: Medicare HMO | Admitting: Family Medicine

## 2022-09-07 ENCOUNTER — Encounter: Payer: Self-pay | Admitting: Family Medicine

## 2022-09-07 VITALS — BP 132/74 | HR 66 | Temp 97.6°F | Ht 61.0 in | Wt 126.0 lb

## 2022-09-07 DIAGNOSIS — E1140 Type 2 diabetes mellitus with diabetic neuropathy, unspecified: Secondary | ICD-10-CM

## 2022-09-07 DIAGNOSIS — R443 Hallucinations, unspecified: Secondary | ICD-10-CM | POA: Diagnosis not present

## 2022-09-07 DIAGNOSIS — G3183 Dementia with Lewy bodies: Secondary | ICD-10-CM

## 2022-09-07 DIAGNOSIS — R69 Illness, unspecified: Secondary | ICD-10-CM | POA: Diagnosis not present

## 2022-09-07 DIAGNOSIS — F02818 Dementia in other diseases classified elsewhere, unspecified severity, with other behavioral disturbance: Secondary | ICD-10-CM

## 2022-09-07 DIAGNOSIS — I10 Essential (primary) hypertension: Secondary | ICD-10-CM | POA: Diagnosis not present

## 2022-09-07 DIAGNOSIS — Z794 Long term (current) use of insulin: Secondary | ICD-10-CM

## 2022-09-07 NOTE — Progress Notes (Signed)
Provider:  Jacalyn Lefevre, MD  Careteam: Patient Care Team: Frederica Kuster, MD as PCP - General (Family Medicine) Van Clines, MD as Consulting Physician (Neurology) Lilian Kapur Rachelle Hora, DPM as Consulting Physician (Podiatry) Gwynneth Munson, Corrie Dandy (Neurology)  PLACE OF SERVICE:  [facility]  [place] Directive information    No Known Allergies  Chief Complaint  Patient presents with   Medical Management of Chronic Issues    Patient presents today for a 4 month follow-up   Quality Metric Gaps    AWV, eye exam, urine microalbumin, TDAP, zoster, pneumonia     HPI: Patient is a 88 y.o. female patient is here for medical management of chronic diseases including insulin-dependent diabetes, Lewy body dementia, hypertension, and atherosclerosis of aorta.  Daughter was very close to admitting her to a assisted living facility but backed out of the last moment because of some legal issues the facility was having.  She is now on a waiting list for another facility and made an [place].  Patient continues to live by herself she still continues to hear voices which discouraged her from opening the door taking medicines.  When asked how she can separate what the voices tell her right from wrong she does not have a good solution but the daughter thinks many times the voices are telling her the wrong things to do. She continues to take Novolin 70/30 A1c has been very good at 6.64 months ago.  This is a little surprising that she will continue to take the medicine given the voices advice.  She also takes Depakote 125 mg twice daily and lisinopril 10 mg for blood pressure  Review of Systems:  Review of Systems  Constitutional: Negative.   HENT: Negative.    Respiratory: Negative.    Cardiovascular: Negative.   Musculoskeletal: Negative.   Neurological: Negative.   Psychiatric/Behavioral:  Positive for hallucinations.   All other systems reviewed and are negative.   Past Medical  History:  Diagnosis Date   Coronary artery disease    Dementia    Diabetes mellitus    Glaucoma    Hallucination    Heart disease    History of heart artery stent 2009   Hypertension    Memory loss    just started on aricept   Mild cognitive impairment    Osteoarthritis    Phobia    Psychosis    Stroke    Vision abnormalities    Past Surgical History:  Procedure Laterality Date   CORONARY ANGIOPLASTY WITH STENT PLACEMENT     CORONARY STENT PLACEMENT     Social History:   reports that she quit smoking about 58 years ago. Her smoking use included cigarettes. She has never used smokeless tobacco. She reports that she does not drink alcohol and does not use drugs.  Family History  Problem Relation Age of Onset   Hypertension Mother    Diabetes Mother    Hypertension Father    Diabetes Father    Hypertension Sister    Diabetes Sister    Kidney disease Sister    Liver disease Brother    Hypertension Daughter    Fibromyalgia Daughter    Rheum arthritis Daughter    Breast cancer Daughter    Hypertension Son    Seizures Granddaughter    Epilepsy Granddaughter     Medications: Patient's Medications  New Prescriptions   No medications on file  Previous Medications   ASPIRIN 81 PO  Take 81 mg by mouth 2 (two) times a week.   COVID-19 MRNA VACCINE 2023-2024 (COMIRNATY) SYRINGE    Inject into the muscle.   DIVALPROEX (DEPAKOTE) 125 MG DR TABLET    Take 1 tablet (125 mg total) by mouth 2 (two) times daily.   LISINOPRIL (ZESTRIL) 10 MG TABLET    Take 1 tablet (10 mg total) by mouth daily.   NOVOLIN 70/30 RELION (70-30) 100 UNIT/ML INJECTION    Inject 25 Units into the skin daily in the afternoon.  Modified Medications   No medications on file  Discontinued Medications   No medications on file    Physical Exam:  Vitals:   09/07/22 1427  BP: 132/74  Pulse: 66  Temp: 97.6 F (36.4 C)  SpO2: 97%  Weight: 126 lb (57.2 kg)  Height:  (1.549 m)   Body mass  index is 23.81 kg/m. Wt Readings from Last 3 Encounters:  09/07/22 126 lb (57.2 kg)  07/06/22 128 lb 9.6 oz (58.3 kg)  06/29/22 126 lb 8 oz (57.4 kg)    Physical Exam Vitals and nursing note reviewed.  Constitutional:      Appearance: Normal appearance.  Cardiovascular:     Rate and Rhythm: Normal rate and regular rhythm.  Pulmonary:     Effort: Pulmonary effort is normal.     Breath sounds: Normal breath sounds.  Abdominal:     General: Bowel sounds are normal.     Palpations: Abdomen is soft.  Neurological:     General: No focal deficit present.     Mental Status: She is alert and oriented to person, place, and time.  Psychiatric:     Comments: Behaviors influenced by hallucinations and auditory voices plan is to move her to assisted living when room is available.  Otherwise daughter checks on her more frequently     Labs reviewed: Basic Metabolic Panel: Recent Labs    09/28/21 0954 05/06/22 1836  NA 138 138  K 4.5 3.8  CL 103 106  CO2 26 21*  GLUCOSE 144* 98  BUN 20 20  CREATININE 1.00* 1.07*  CALCIUM 9.7 9.2   Liver Function Tests: Recent Labs    05/06/22 1836  AST 24  ALT 14  ALKPHOS 71  BILITOT 0.4  PROT 7.5  ALBUMIN 3.9   No results for input(s): "LIPASE", "AMYLASE" in the last 8760 hours. No results for input(s): "AMMONIA" in the last 8760 hours. CBC: Recent Labs    05/06/22 1836  WBC 6.7  NEUTROABS 3.9  HGB 14.1  HCT 42.4  MCV 88.9  PLT 260   Lipid Panel: No results for input(s): "CHOL", "HDL", "LDLCALC", "TRIG", "CHOLHDL", "LDLDIRECT" in the last 8760 hours. TSH: No results for input(s): "TSH" in the last 8760 hours. A1C: Lab Results  Component Value Date   HGBA1C 6.6 (H) 05/04/2022     Assessment/Plan  1. Type 2 diabetes mellitus with diabetic neuropathy, with long-term current use of insulin Plan to check A1c today otherwise continue with Novolin 70/30 as before  2. Essential hypertension blood pressure is good at 132/74  on lisinopril 10 mg  3. Dementia, Lewy body with behavior disturbance No real change.  Cognitively she appears intact but continues to hear voices  4. HALLUCINATION Taking Depakote but continues to have hallucinations   Jacalyn Lefevre, MD [facility] [phone]

## 2022-09-08 LAB — BASIC METABOLIC PANEL WITH GFR
BUN/Creatinine Ratio: 21 (calc) (ref 6–22)
BUN: 20 mg/dL (ref 7–25)
CO2: 25 mmol/L (ref 20–32)
Calcium: 9.8 mg/dL (ref 8.6–10.4)
Chloride: 106 mmol/L (ref 98–110)
Creat: 0.96 mg/dL — ABNORMAL HIGH (ref 0.60–0.95)
Glucose, Bld: 101 mg/dL — ABNORMAL HIGH (ref 65–99)
Potassium: 4.1 mmol/L (ref 3.5–5.3)
Sodium: 141 mmol/L (ref 135–146)
eGFR: 58 mL/min/{1.73_m2} — ABNORMAL LOW (ref >=60)

## 2022-09-08 LAB — HEMOGLOBIN A1C
Hgb A1c MFr Bld: 7 % of total Hgb — ABNORMAL HIGH (ref <5.7)
Mean Plasma Glucose: 154 mg/dL
eAG (mmol/L): 8.5 mmol/L

## 2022-09-22 ENCOUNTER — Encounter: Payer: Medicare HMO | Admitting: Adult Health

## 2022-09-30 ENCOUNTER — Ambulatory Visit (INDEPENDENT_AMBULATORY_CARE_PROVIDER_SITE_OTHER): Payer: Medicare HMO | Admitting: Adult Health

## 2022-09-30 ENCOUNTER — Encounter: Payer: Self-pay | Admitting: Adult Health

## 2022-09-30 DIAGNOSIS — Z0000 Encounter for general adult medical examination without abnormal findings: Secondary | ICD-10-CM | POA: Diagnosis not present

## 2022-09-30 NOTE — Progress Notes (Signed)
This service is provided via telemedicine  No vital signs collected/recorded due to the encounter was a telemedicine visit.   Location of patient (ex: home, work):  Home   Patient consents to a video visit:  Yes, 09/30/2022   Location of the provider (ex: office, home):  [facility]  Name of any referring provider:  Frederica Kuster, MD   Names of all persons participating in the telemedicine service and their role in the encounter: Bethany B/CMA, Roshon Duell C.Grayce Sessions, NP , and patient  Time spent on call:  11 minutes      This is a video visit.  Provider location:  [facility]  Patient location:  Home     Subjective:   Robyn [name] is a 88 y.o. female who presents for Medicare Annual (Subsequent) preventive examination.  Review of Systems     Cardiac Risk Factors include: advanced age (>25men, >27 women);diabetes mellitus;hypertension     Objective:    There were no vitals filed for this visit. There is no height or weight on file to calculate BMI.     09/30/2022    1:44 PM 04/08/2022    1:00 PM 11/15/2021    2:57 PM 10/06/2021    2:31 PM 06/28/2021    1:09 PM 04/27/2021    1:31 PM 04/13/2021    8:51 AM  Advanced Directives  Does Patient Have a Medical Advance Directive? Yes Yes No No No No No  Type of Advance Directive Healthcare Power of Attorney        Does patient want to make changes to medical advance directive? No - Patient declined        Would patient like information on creating a medical advance directive?   No - Patient declined   No - Patient declined     Current Medications (verified) Outpatient Encounter Medications as of 09/30/2022  Medication Sig   ASPIRIN 81 PO Take 81 mg by mouth 2 (two) times a week.   divalproex (DEPAKOTE) 125 MG DR tablet Take 1 tablet (125 mg total) by mouth 2 (two) times daily.   lisinopril (ZESTRIL) 10 MG tablet Take 1 tablet (10 mg total) by mouth daily.   NOVOLIN 70/30 RELION  (70-30) 100 UNIT/ML injection Inject 25 Units into the skin daily in the afternoon.   [DISCONTINUED] COVID-19 mRNA vaccine 2023-2024 (COMIRNATY) syringe Inject into the muscle.   No facility-administered encounter medications on file as of 09/30/2022.    Allergies (verified) Patient has no known allergies.   History: Past Medical History:  Diagnosis Date   Coronary artery disease    Dementia (HCC)    Diabetes mellitus    Glaucoma    Hallucination    Heart disease    History of heart artery stent 2009   Hypertension    Memory loss    just started on aricept   Mild cognitive impairment    Osteoarthritis    Phobia    Psychosis (HCC)    Stroke (HCC)    Vision abnormalities    Past Surgical History:  Procedure Laterality Date   CORONARY ANGIOPLASTY WITH STENT PLACEMENT     CORONARY STENT PLACEMENT     Family History  Problem Relation Age of Onset   Hypertension Mother    Diabetes Mother    Hypertension Father    Diabetes Father    Hypertension Sister    Diabetes Sister    Kidney disease Sister    Liver disease Brother  Hypertension Daughter    Fibromyalgia Daughter    Rheum arthritis Daughter    Breast cancer Daughter    Hypertension Son    Seizures Granddaughter    Epilepsy Granddaughter    Social History   Socioeconomic History   Marital status: Widowed    Spouse name: Not on file   Number of children: Not on file   Years of education: 10   Highest education level: Not on file  Occupational History   Occupation: retired  Tobacco Use   Smoking status: Former    Types: Cigarettes    Quit date: 10/08/1963    Years since quitting: 59.0   Smokeless tobacco: Never  Vaping Use   Vaping Use: Never used  Substance and Sexual Activity   Alcohol use: No   Drug use: No   Sexual activity: Not on file  Other Topics Concern   Not on file  Social History Narrative   Diet:      Caffeine:      Married, if yes what year:       Do you live in a house,  apartment, assisted living, condo, trailer, ect: Apartment      Is it one or more stories: one      How many persons live in your home? Self      Pets: No      Highest level or education completed: 10 th      Current/Past profession: Museum/gallery curator      Exercise:                  Type and how often:          Living Will: No   DNR: No   POA/HPOA: No      Functional Status:   Do you have difficulty bathing or dressing yourself? No   Do you have difficulty preparing food or eating?   Do you have difficulty managing your medications?   Do you have difficulty managing your finances?   Do you have difficulty affording your medications? No      Right handed       Social Determinants of Health   Financial Resource Strain: Not on file  Food Insecurity: Not on file  Transportation Needs: Not on file  Physical Activity: Not on file  Stress: Not on file  Social Connections: Not on file    Tobacco Counseling Counseling given: Not Answered   Clinical Intake:  Pre-visit preparation completed: No  Pain : No/denies pain     BMI - recorded:  (23.81) Nutritional Status: BMI of 19-24  Normal Nutritional Risks: None Diabetes: Yes CBG done?: Yes (104 CBG) CBG resulted in Enter/ Edit results?: Yes Did pt. bring in CBG monitor from home?: Yes Glucose Meter Downloaded?: No  How often do you need to have someone help you when you read instructions, pamphlets, or other written materials from your doctor or pharmacy?: 3 - Sometimes What is the last grade level you completed in school?: Grade 10th  Diabetic?Yes  Interpreter Needed?: No  Information entered by :: Robyn Baratta Medina-Vargas DNP   Activities of Daily Living    09/30/2022    1:47 PM  In your present state of health, do you have any difficulty performing the following activities:  Hearing? 1  Vision? 0  Difficulty concentrating or making decisions? 1  Walking or climbing stairs? 1  Dressing or bathing? 0   Doing errands, shopping? 1  Preparing Food and eating ? Robyn [name]  Using the Toilet? N  In the past six months, have you accidently leaked urine? N  Do you have problems with loss of bowel control? N  Managing your Medications? Y  Managing your Finances? Y  Housekeeping or managing your Housekeeping? Y    Patient Care Team: Frederica Kuster, MD as PCP - General (Family Medicine) Van Clines, MD as Consulting Physician (Neurology) Lilian Kapur Rachelle Hora, DPM as Consulting Physician (Podiatry) Gwynneth Munson, Corrie Dandy (Neurology)  Indicate any recent Medical Services you may have received from other than [facility] providers in the past year (date may be approximate).     Assessment:   This is a routine wellness examination for Robyn [name].  Hearing/Vision screen Hearing Screening - Comments:: Patient daughter states that her mother is having a little trouble hearing in the left hear. Vision Screening - Comments:: Patient' daughter let me know that her Mother has an appointment this month on the 55 th.  Dietary issues and exercise activities discussed: Exercise limited by: psychological condition(s);cardiac condition(s);neurologic condition(s)   Goals Addressed             This Visit's Progress    DIET - INCREASE WATER INTAKE        -  will take 3 bottles 16 oz each water       Depression Screen    09/30/2022    1:44 PM 06/29/2022    4:09 PM 04/27/2021    1:32 PM  PHQ 2/9 Scores  PHQ - 2 Score 0 0 0    Fall Risk    09/30/2022    1:42 PM 09/07/2022    2:29 PM 07/06/2022   10:09 AM 06/29/2022    4:09 PM 05/04/2022    2:37 PM  Fall Risk   Falls in the past year? 0 0 0 0 0  Number falls in past yr: 0 0 0 0 0  Injury with Fall? 0 0 0 0 0  Risk for fall due to : No Fall Risks No Fall Risks No Fall Risks No Fall Risks No Fall Risks  Follow up Falls evaluation completed Falls evaluation completed Falls evaluation completed Falls evaluation completed Falls evaluation completed    FALL RISK  PREVENTION PERTAINING TO THE HOME:  Any stairs in or around the home? No  If so, are there any without handrails?  N/A Home free of loose throw rugs in walkways, pet beds, electrical cords, etc? Yes  Adequate lighting in your home to reduce risk of falls? Yes   ASSISTIVE DEVICES UTILIZED TO PREVENT FALLS:  Life alert? Yes  Use of a cane, walker or w/c? Yes  Grab bars in the bathroom? Yes  Shower chair or bench in shower? Yes  Elevated toilet seat or a handicapped toilet? Yes   TIMED UP AND GO:  Was the test performed? No .  Length of time to ambulate 10 feet: N/A sec.   Gait steady and fast without use of assistive device  Cognitive Function:    04/08/2022    1:00 PM 10/28/2020    3:00 PM 06/05/2020   10:00 AM  MMSE - Mini Mental State Exam  Orientation to time 5 5 5   Orientation to Place 5 5 2   Registration 3 3 0  Attention/ Calculation 5 0 0  Recall 3 1 0  Language- name 2 objects 2 2 2   Language- repeat 1 1 1   Language- follow 3 step command 3 3 3   Language- read & follow direction 1 1  1  Write a sentence 1 1 1   Copy design 1 1 0  Total score 30 23 15       12/02/2014   10:00 AM  Montreal Cognitive Assessment   Visuospatial/ Executive (0/5) 3  Naming (0/3) 3  Attention: Read list of digits (0/2) 2  Attention: Read list of letters (0/1) 1  Attention: Serial 7 subtraction starting at 100 (0/3) 3  Language: Repeat phrase (0/2) 2  Language : Fluency (0/1) 0  Abstraction (0/2) 2  Delayed Recall (0/5) 3  Orientation (0/6) 6  Total 25  Adjusted Score (based on education) 25      09/30/2022    1:44 PM  6CIT Screen  What Year? 0 points  What month? 0 points  What time? 0 points  Count back from 20 0 points  Months in reverse 4 points  Repeat phrase 0 points  Total Score 4 points    Immunizations Immunization History  Administered Date(s) Administered   COVID-19, mRNA, vaccine(Comirnaty)12 years and older 07/06/2022   Fluad Quad(high Dose 65+)  04/27/2021, 05/04/2022   PFIZER Comirnaty(Gray Top)Covid-19 Tri-Sucrose Vaccine 10/12/2020   PFIZER(Purple Top)SARS-COV-2 Vaccination 09/05/2019, 09/30/2019   Pfizer Covid-19 Vaccine Bivalent Booster 43yrs & up 04/14/2021    TDAP status: Due, Education has been provided regarding the importance of this vaccine. Advised may receive this vaccine at local pharmacy or Health Dept. Aware to provide a copy of the vaccination record if obtained from local pharmacy or Health Dept. Verbalized acceptance and understanding.  Flu Vaccine status: Up to date  Pneumococcal vaccine status: Due, Education has been provided regarding the importance of this vaccine. Advised may receive this vaccine at local pharmacy or Health Dept. Aware to provide a copy of the vaccination record if obtained from local pharmacy or Health Dept. Verbalized acceptance and understanding.  Covid-19 vaccine status: Information provided on how to obtain vaccines.   Qualifies for Shingles Vaccine? No   Zostavax completed No   Shingrix Completed?: No.    Education has been provided regarding the importance of this vaccine. Patient has been advised to call insurance company to determine out of pocket expense if they have not yet received this vaccine. Advised may also receive vaccine at local pharmacy or Health Dept. Verbalized acceptance and understanding.  Screening Tests Health Maintenance  Topic Date Due   DTaP/Tdap/Td (1 - Tdap) Never done   Zoster Vaccines- Shingrix (1 of 2) Never done   Pneumonia Vaccine 29+ Years old (1 of 1 - PCV) Never done   OPHTHALMOLOGY EXAM  11/18/2022 (Originally 09/15/1944)   INFLUENZA VACCINE  12/22/2022   HEMOGLOBIN A1C  03/09/2023   FOOT EXAM  05/26/2023   Medicare Annual Wellness (AWV)  09/30/2023   DEXA SCAN  Completed   COVID-19 Vaccine  Completed   HPV VACCINES  Aged Out    Health Maintenance  Health Maintenance Due  Topic Date Due   DTaP/Tdap/Td (1 - Tdap) Never done   Zoster  Vaccines- Shingrix (1 of 2) Never done   Pneumonia Vaccine 45+ Years old (1 of 1 - PCV) Never done    Colorectal cancer screening: No longer required.   Mammogram status: No longer required due to Aged-out.  Bone Density status: Completed 2-3 years ago. Results reflect: Bone density results: NORMAL. Repeat every No years.Aged out  Lung Cancer Screening: (Low Dose CT Chest recommended if Age 29-80 years, 30 pack-year currently smoking OR have quit w/in 15years.) does not qualify.   Lung Cancer Screening Referral:  No  Additional Screening:  Hepatitis C Screening: does not qualify; Completed N/A  Vision Screening: Recommended annual ophthalmology exams for early detection of glaucoma and other disorders of the eye. Is the patient up to date with their annual eye exam?  Yes  Who is the provider or what is the name of the office in which the patient attends annual eye exams? My Eye doctor appt on 10/11/22 If pt is not established with a provider, would they like to be referred to a provider to establish care? No .   Dental Screening: Recommended annual dental exams for proper oral hygiene  Community Resource Referral / Chronic Care Management: CRR required this visit?  No   CCM required this visit?  No      Plan:     I have personally reviewed and noted the following in the patient's chart:   Medical and social history Use of alcohol, tobacco or illicit drugs  Current medications and supplements including opioid prescriptions. Patient is not currently taking opioid prescriptions. Functional ability and status Nutritional status Physical activity Advanced directives List of other physicians Hospitalizations, surgeries, and ER visits in previous 12 months Vitals Screenings to include cognitive, depression, and falls Referrals and appointments  In addition, I have reviewed and discussed with patient certain preventive protocols, quality metrics, and best practice  recommendations. A written personalized care plan for preventive services as well as general preventive health recommendations were provided to patient.     Robyn Felty Medina-Vargas, NP   09/30/2022   Nurse Notes Will need PNA , Shingles ant Tetanus vaccine

## 2022-09-30 NOTE — Progress Notes (Signed)
I connected with  Robyn [name] on 09/30/22 by a video enabled telemedicine application and verified that I am speaking with the correct person using two identifiers.   I discussed the limitations of evaluation and management by telemedicine. The patient expressed understanding and agreed to proceed.

## 2022-10-20 ENCOUNTER — Encounter: Payer: Self-pay | Admitting: Physician Assistant

## 2022-10-20 ENCOUNTER — Ambulatory Visit (INDEPENDENT_AMBULATORY_CARE_PROVIDER_SITE_OTHER): Payer: Medicare HMO | Admitting: Physician Assistant

## 2022-10-20 VITALS — BP 110/70 | HR 78 | Resp 18 | Ht 61.0 in | Wt 127.0 lb

## 2022-10-20 DIAGNOSIS — G3183 Dementia with Lewy bodies: Secondary | ICD-10-CM

## 2022-10-20 DIAGNOSIS — F02818 Dementia in other diseases classified elsewhere, unspecified severity, with other behavioral disturbance: Secondary | ICD-10-CM | POA: Diagnosis not present

## 2022-10-20 NOTE — Patient Instructions (Signed)
It was a pleasure to see you today at our office.   Recommendations:  Follow up in  6 months  Increase Depakote 125 mg two times a day , may increase up to 250 mg at night or twice a day if needed   Consider [facility]  [address] [place], [place] [address] [phone]  Hours of Operation Mondays to Thursdays: 8 am to 8 pm,Fridays: 9 am to 8 pm, Saturdays: 9 am to 1 pm Sundays: Closed  [email or web address]      Whom to call:  Memory  decline, memory medications: Call out office [phone]   For psychiatric meds, mood meds: Please have your primary care physician manage these medications.      For assessment of decision of mental capacity and competency:  Call Dr. Erick Blinks, geriatric psychiatrist at [phone]  For guidance in geriatric dementia issues please call [facility] [phone]   If you have any severe symptoms of a stroke, or other severe issues such as confusion,severe chills or fever, etc call 911 or go to the ER as you may need to be evaluate further   Feel free to visit Facebook page " [email or web address]" for tips of how to care for people with memory problems.      RECOMMENDATIONS FOR ALL PATIENTS WITH MEMORY PROBLEMS: 1. Continue to exercise (Recommend 30 minutes of walking everyday, or 3 hours every week) 2. Increase social interactions - continue going to [place] and enjoy social gatherings with friends and family 3. Eat healthy, avoid fried foods and eat more fruits and vegetables 4. Maintain adequate blood pressure, blood sugar, and blood cholesterol level. Reducing the risk of stroke and cardiovascular disease also helps promoting better memory. 5. Avoid stressful situations. Live a simple life and avoid aggravations. Organize your time and prepare for the next day in anticipation. 6. Sleep well, avoid any interruptions of sleep and avoid any  distractions in the bedroom that may interfere with adequate sleep quality 7. Avoid sugar, avoid sweets as there is a strong link between excessive sugar intake, diabetes, and cognitive impairment We discussed the Mediterranean diet, which has been shown to help patients reduce the risk of progressive memory disorders and reduces cardiovascular risk. This includes eating fish, eat fruits and green leafy vegetables, nuts like almonds and hazelnuts, walnuts, and also use olive oil. Avoid fast foods and fried foods as much as possible. Avoid sweets and sugar as sugar use has been linked to worsening of memory function.  There is always a concern of gradual progression of memory problems. If this is the case, then we may need to adjust level of care according to patient needs. Support, both to the patient and caregiver, should then be put into place.    The Alzheimer's Association is here all day, every day for people facing Alzheimer's disease through our free 24/7 Helpline: [phone]. The Helpline provides reliable information and support to all those who need assistance, such as individuals living with memory loss, Alzheimer's or other dementia, caregivers, health care professionals and the public.  Our highly trained and knowledgeable staff can help you with: Understanding memory loss, dementia and Alzheimer's  Medications and other treatment options  General information about aging and brain health  Skills to provide quality care and to find the best care from professionals  Legal, financial and living-arrangement decisions Our Helpline also features: Confidential care consultation provided by master's level clinicians who can help with decision-making support, crisis assistance and education on  issues families face every day  Help in a caller's preferred language using our translation service that features more than 200 languages and dialects  Referrals to local community programs, services and  ongoing support     FALL PRECAUTIONS: Be cautious when walking. Scan the area for obstacles that may increase the risk of trips and falls. When getting up in the mornings, sit up at the edge of the bed for a few minutes before getting out of bed. Consider elevating the bed at the head end to avoid drop of blood pressure when getting up. Walk always in a well-lit room (use night lights in the walls). Avoid area rugs or power cords from appliances in the middle of the walkways. Use a walker or a cane if necessary and consider physical therapy for balance exercise. Get your eyesight checked regularly.  FINANCIAL OVERSIGHT: Supervision, especially oversight when making financial decisions or transactions is also recommended.  HOME SAFETY: Consider the safety of the kitchen when operating appliances like stoves, microwave oven, and blender. Consider having supervision and share cooking responsibilities until no longer able to participate in those. Accidents with firearms and other hazards in the house should be identified and addressed as well.   ABILITY TO BE LEFT ALONE: If patient is unable to contact 911 operator, consider using LifeLine, or when the need is there, arrange for someone to stay with patients. Smoking is a fire hazard, consider supervision or cessation. Risk of wandering should be assessed by caregiver and if detected at any point, supervision and safe proof recommendations should be instituted.  MEDICATION SUPERVISION: Inability to self-administer medication needs to be constantly addressed. Implement a mechanism to ensure safe administration of the medications.   DRIVING: Regarding driving, in patients with progressive memory problems, driving will be impaired. We advise to have someone else do the driving if trouble finding directions or if minor accidents are reported. Independent driving assessment is available to determine safety of driving.   If you are interested in the  driving assessment, you can contact the following:  [employer] in [place] [phone]  [facility] [phone]  [facility] [phone] or [phone]      Mediterranean Diet A Mediterranean diet refers to food and lifestyle choices that are based on the traditions of countries located on the [employer]. This way of eating has been shown to help prevent certain conditions and improve outcomes for people who have chronic diseases, like kidney disease and heart disease. What are tips for following this plan? Lifestyle  Cook and eat meals together with your family, when possible. Drink enough fluid to keep your urine clear or pale yellow. Be physically active every day. This includes: Aerobic exercise like running or swimming. Leisure activities like gardening, walking, or housework. Get 7-8 hours of sleep each night. If recommended by your health care provider, drink red wine in moderation. This means 1 glass a day for nonpregnant women and 2 glasses a day for men. A glass of wine equals 5 oz (150 mL). Reading food labels  Check the serving size of packaged foods. For foods such as rice and pasta, the serving size refers to the amount of cooked product, not dry. Check the total fat in packaged foods. Avoid foods that have saturated fat or trans fats. Check the ingredients list for added sugars, such as corn syrup. Shopping  At the grocery store, buy most of your food from the areas near the  walls of the store. This includes: Fresh fruits and vegetables (produce). Grains, beans, nuts, and seeds. Some of these may be available in unpackaged forms or large amounts (in bulk). Fresh seafood. Poultry and eggs. Low-fat dairy products. Buy whole ingredients instead of prepackaged foods. Buy fresh fruits and vegetables in-season from local farmers markets. Buy frozen fruits and vegetables in resealable  bags. If you do not have access to quality fresh seafood, buy precooked frozen shrimp or canned fish, such as tuna, salmon, or sardines. Buy small amounts of raw or cooked vegetables, salads, or olives from the deli or salad bar at your store. Stock your pantry so you always have certain foods on hand, such as olive oil, canned tuna, canned tomatoes, rice, pasta, and beans. Cooking  Cook foods with extra-virgin olive oil instead of using butter or other vegetable oils. Have meat as a side dish, and have vegetables or grains as your main dish. This means having meat in small portions or adding small amounts of meat to foods like pasta or stew. Use beans or vegetables instead of meat in common dishes like chili or lasagna. Experiment with different cooking methods. Try roasting or broiling vegetables instead of steaming or sauteing them. Add frozen vegetables to soups, stews, pasta, or rice. Add nuts or seeds for added healthy fat at each meal. You can add these to yogurt, salads, or vegetable dishes. Marinate fish or vegetables using olive oil, lemon juice, garlic, and fresh herbs. Meal planning  Plan to eat 1 vegetarian meal one day each week. Try to work up to 2 vegetarian meals, if possible. Eat seafood 2 or more times a week. Have healthy snacks readily available, such as: Vegetable sticks with hummus. Greek yogurt. Fruit and nut trail mix. Eat balanced meals throughout the week. This includes: Fruit: 2-3 servings a day Vegetables: 4-5 servings a day Low-fat dairy: 2 servings a day Fish, poultry, or lean meat: 1 serving a day Beans and legumes: 2 or more servings a week Nuts and seeds: 1-2 servings a day Whole grains: 6-8 servings a day Extra-virgin olive oil: 3-4 servings a day Limit red meat and sweets to only a few servings a month What are my food choices? Mediterranean diet Recommended Grains: Whole-grain pasta. Brown rice. Bulgar wheat. Polenta. Couscous. Whole-wheat bread.  Orpah Cobb. Vegetables: Artichokes. Beets. Broccoli. Cabbage. Carrots. Eggplant. Green beans. Chard. Kale. Spinach. Onions. Leeks. Peas. Squash. Tomatoes. Peppers. Radishes. Fruits: Apples. Apricots. Avocado. Berries. Bananas. Cherries. Dates. Figs. Grapes. Lemons. Melon. Oranges. Peaches. Plums. Pomegranate. Meats and other protein foods: Beans. Almonds. Sunflower seeds. Pine nuts. Peanuts. Cod. Salmon. Scallops. Shrimp. Tuna. Tilapia. Clams. Oysters. Eggs. Dairy: Low-fat milk. Cheese. Greek yogurt. Beverages: Water. Red wine. Herbal tea. Fats and oils: Extra virgin olive oil. Avocado oil. Grape seed oil. Sweets and desserts: [place] yogurt with honey. Baked apples. Poached pears. Trail mix. Seasoning and other foods: Basil. Cilantro. Coriander. Cumin. Mint. Parsley. Sage. Rosemary. Tarragon. Garlic. Oregano. Thyme. Pepper. Balsalmic vinegar. Tahini. Hummus. Tomato sauce. Olives. Mushrooms. Limit these Grains: Prepackaged pasta or rice dishes. Prepackaged cereal with added sugar. Vegetables: Deep fried potatoes (french fries). Fruits: Fruit canned in syrup. Meats and other protein foods: Beef. Pork. Lamb. Poultry with skin. Hot dogs. Tomasa Blase. Dairy: Ice cream. Sour cream. Whole milk. Beverages: Juice. Sugar-sweetened soft drinks. Beer. Liquor and spirits. Fats and oils: Butter. Canola oil. Vegetable oil. Beef fat (tallow). Lard. Sweets and desserts: Cookies. Cakes. Pies. Candy. Seasoning and other foods: Mayonnaise. Premade sauces and marinades. The items  listed may not be a complete list. Talk with your dietitian about what dietary choices are right for you. Summary The Mediterranean diet includes both food and lifestyle choices. Eat a variety of fresh fruits and vegetables, beans, nuts, seeds, and whole grains. Limit the amount of red meat and sweets that you eat. Talk with your health care provider about whether it is safe for you to drink red wine in moderation. This means 1 glass a day  for nonpregnant women and 2 glasses a day for men. A glass of wine equals 5 oz (150 mL). This information is not intended to replace advice given to you by your health care provider. Make sure you discuss any questions you have with your health care provider. Document Released: 12/31/2015 Document Revised: 02/02/2016 Document Reviewed: 12/31/2015 Elsevier Interactive Patient Education  2017 [employer].

## 2022-10-20 NOTE — Progress Notes (Signed)
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 RH female seen today in follow up for memory loss.  Patient is not on antidementia medication as per daughter's request, given the risk outweighing the benefits.  Patient is on Depakote 125 mg at night, daughter reports that the patient could use the medication twice a day, but she refuses at this time.  She is able to participate in her ADLs, but she does require more surveillance than before. MMSE today is 27/30.    Follow up in  6 months. Continue Depakote 125 mg twice daily for hallucinations. May increase dose up to 250 mg bid  Side effects discussed. Recommend daughter controls the medications Recommend good control of her cardiovascular risk factors Increase socialization Continue to control mood as per PCP    Subjective:    This patient is accompanied in the office by her daughter who supplements the history.  Previous records as well as any outside records available were reviewed prior to todays visit. Patient was last seen on 04/08/2022, with MMSE 30/30  Any changes in memory since last visit? "Same,but she does not think that she has memory issues". She is able to remember conversations and names. Sometimes she feels that the TV personalities are real.  repeats oneself?  Endorsed Disoriented when walking into a room?  Patient denies except occasionally not remembering what patient came to the room for   Leaving objects in unusual places?    denies , she collects stuff.  Wandering behavior? Daughter has locked the doors because she is afraid that she would wander off.  Any personality changes since last visit?  denies   Any worsening depression?:  denies   Hallucinations or paranoia?  Endorsed, she talks with and about "they or them". Recently she wanted to lock the doors because the movers were coming"(there were no movers).   Seizures?     denies    Any sleep changes?  Sleeps well. She reports vivid dreams, denies REM behavior or sleepwalking   Sleep apnea?   denies   Any hygiene concerns?    denies   Independent of bathing and dressing?  Endorsed  Does the patient needs help with medications? Patient is in charge  Who is in charge of the finances? Daughter is in charge    Any changes in appetite?  Better denies    Patient have trouble swallowing?  denies   Does the patient cook?  Any kitchen accidents such as leaving the stove on? Patient denies   Any headaches?   denies   Chronic back pain  denies   Ambulates with difficulty?  Denies, daughter reports that she does not want to use a cane for stability  Recent falls or head injuries? denies     Unilateral weakness, numbness or tingling? denies   Any tremors?  denies   Any anosmia?  Patient denies   Any incontinence of urine?  She has stress incontinence but does not like to use diapers. Any bowel dysfunction?  denies      Patient lives with her daughter. She has been searching for placement for her as she needs more monitoring, but has been declined in view of her LBD diagnosis. She is looking into other facilities.    Does the patient drive?no longer drives      HISTORY OF PRESENT ILLNESS 06/05/20: This is an 88 year old right-handed woman with  a history of hypertension, hyperlipidemia, diabetes, dementia, presenting for evaluation of dementia. On her PCP appointment in October 2021, daughter reported she is often disoriented to place and time, and hears voices telling her what to do. She lives alone and her daughters check on her multiple times per week and help with housework. She was evaluated by neurologist Dr. Epimenio Foot for similar symptoms in 2016. Symptoms had started around 2014. At that time, hallucinations were primarily auditory but sometimes visual. She also reported sleep difficulty with the voices speaking to her when she tries to fall asleep. She had side effects  of nausea on Donepezil and was prescribed Memantine and Seroquel. MOCA 26/30 in 11/2014. Etiology of symptoms at that time unclear, she did not have features of LBD or AD. She was started on hydroxyzine then was lost to follow-up.   Over the years, Robyn [name] reports that the auditory hallucinations have worsened. The voices would tell her to go outside sometimes, as far as [name] knows she has not wandered outside. She reports heaviness in her truncal region, that she has gained weight, Robyn [name] states she thinks the people are causing the heaviness behind her. The voices tell her that her laundry is not worth keeping. When they are walking, she wants to go a different way because the voices are telling her to go that way. She would offer food when she is eating. She goes to the bathroom and would be heard saying leave her alone. She would stand and stare, and when asked what she is doing, she says she is talking to them. Initially she stated she does not hear anything, then Robyn [name] reminds her it occurs all the time, she talks to them and waves while she is at [employer]. She states they are in a group, in her house, and can be scary for her, keeping her up at night. She only gets 1-2 hours of sleep at night and naps in the day. She recalls Seroquel (and maybe hydroxyzine) causing dizziness in the past. She says she took these for a long time, but was lost to follow-up.    Robyn [name] thinks her memory is okay. She continues to live alone. She states her memory is "sometimes not very well." She does not drive. She continues to manage her own medications and finances, they deny any issues. She denies leaving the stove on. She has occasional word-finding difficulties. There is no clear REM behavior disorder. She has occasional hand tremors. She always feel like her balance is off, no falls. She denies any headaches, diplopia, dysarthria/dysphagia, neck pain, focal numbness/tingling/weakness, bowel/bladder dysfunction, anosmia. There is  no family history of dementia, no history of significant head injuries or alcohol use.      Laboratory Data: EKG in 06/2019 showed a QTc of 460   I personally reviewed head CTs done over the years, most recently in 06/2019 which showed moderate diffuse atrophy, there appears to be more in the right frontal and occipital regions.   Neurocognitive testing 10/28/2020 Dr. Roseanne Reno Ms. Rehm demonstrated impaired performance on measures of processing speed, executive function, and her visuospatial and constructional functioning fell at an unusually low level, suggesting some mild difficulties. She also had marked naming problems and diminished phonemic as compared to semantic fluency. Memory performance was notable for low encoding of information but she retained verbal information well and does not appear to have a frank storage problem at this time. She did well on measures of attention/working memory. Her CDR places her in the  mild dementia range based on the history of provided and her objective test performance, although her daughter characterized her as functioning at a moderate dementia level. It is possible that movement or other issues are confounding her rating or perhaps the patient simply tests well. She did score a 4/4 on the Mayo Fluctuations Questionnaire.   Ms. Goldbaum is thus demonstrating a dementia level problem with primary deficits on measures of processing speed, executive functioning, and naming and she has some additional visuospatial/constructional issues. 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 Her test data are weakly supportive although her visuospatial functioning is less affected than might be expected. A mixed pathological picture is also possible, although her test data are equivocal for AD (has naming problems but no memory storage problem).    PREVIOUS  MEDICATIONS:   CURRENT MEDICATIONS:  Outpatient Encounter Medications as of 10/20/2022  Medication Sig   ASPIRIN 81 PO Take 81 mg by mouth 2 (two) times a week.   divalproex (DEPAKOTE) 125 MG DR tablet Take 1 tablet (125 mg total) by mouth 2 (two) times daily.   lisinopril (ZESTRIL) 10 MG tablet Take 1 tablet (10 mg total) by mouth daily.   NOVOLIN 70/30 RELION (70-30) 100 UNIT/ML injection Inject 25 Units into the skin daily in the afternoon.   No facility-administered encounter medications on file as of 10/20/2022.       10/20/2022    3:00 PM 04/08/2022    1:00 PM 10/28/2020    3:00 PM  MMSE - Mini Mental State Exam  Orientation to time 4 5 5   Orientation to Place 5 5 5   Registration 3 3 3   Attention/ Calculation 5 5 0  Recall 1 3 1   Language- name 2 objects 2 2 2   Language- repeat 1 1 1   Language- follow 3 step command 3 3 3   Language- read & follow direction 1 1 1   Write a sentence 1 1 1   Copy design 1 1 1   Total score 27 30 23       12/02/2014   10:00 AM  Montreal Cognitive Assessment   Visuospatial/ Executive (0/5) 3  Naming (0/3) 3  Attention: Read list of digits (0/2) 2  Attention: Read list of letters (0/1) 1  Attention: Serial 7 subtraction starting at 100 (0/3) 3  Language: Repeat phrase (0/2) 2  Language : Fluency (0/1) 0  Abstraction (0/2) 2  Delayed Recall (0/5) 3  Orientation (0/6) 6  Total 25  Adjusted Score (based on education) 25    Objective:     PHYSICAL EXAMINATION:    VITALS:   Vitals:   10/20/22 1409  BP: 110/70  Pulse: 78  Resp: 18  SpO2: 98%  Weight: 127 lb (57.6 kg)  Height: 5\' 1"  (1.549 m)    GEN:  The patient appears stated age and is in NAD. HEENT:  Normocephalic, atraumatic.   Neurological examination:  General: NAD, well-groomed, appears stated age. Orientation: The patient is alert. Oriented to person, place and date Cranial nerves: There is good facial symmetry.The speech is fluent and clear. No aphasia or dysarthria.  Fund of knowledge is appropriate. Recent and remote memory are impaired. Attention and concentration are reduced.  Able to name objects and repeat phrases.  Hearing is intact to conversational tone.  Sensation: Sensation is intact to light touch throughout Motor: Strength is at least antigravity x4. DTR's 2/4 in UE/LE  Movement examination: Tone: There is normal tone in the UE/LE. No cogwheel. Abnormal movements:  no tremor.  No myoclonus.  No asterixis.   Coordination:  There is no decremation with RAM's. Normal finger to nose  Gait and Station: The patient has no difficulty arising out of a deep-seated chair without the use of the hands. The patient's stride length is good.  Gait is cautious and narrow.    Thank you for allowing [place] the opportunity to participate in the care of this nice patient. Please do not hesitate to contact us for any questions or concerns.   Total time spent on today's visit was 20 minutes dedicated to this patient today, preparing to see patient, examining the patient, ordering tests and/or medications and counseling the patient, documenting clinical information in the EHR or other health record, independently interpreting results and communicating results to the patient/family, discussing treatment and goals, answering patient's questions and coordinating care.  Cc:  Frederica Kuster, MD  Marlowe Kays 10/20/2022 3:47 PM

## 2022-11-01 ENCOUNTER — Ambulatory Visit (INDEPENDENT_AMBULATORY_CARE_PROVIDER_SITE_OTHER): Payer: Medicare HMO | Admitting: Podiatry

## 2022-11-01 ENCOUNTER — Encounter: Payer: Self-pay | Admitting: Podiatry

## 2022-11-01 DIAGNOSIS — M79675 Pain in left toe(s): Secondary | ICD-10-CM | POA: Diagnosis not present

## 2022-11-01 DIAGNOSIS — B351 Tinea unguium: Secondary | ICD-10-CM | POA: Diagnosis not present

## 2022-11-01 DIAGNOSIS — M79674 Pain in right toe(s): Secondary | ICD-10-CM | POA: Diagnosis not present

## 2022-11-01 DIAGNOSIS — E1142 Type 2 diabetes mellitus with diabetic polyneuropathy: Secondary | ICD-10-CM

## 2022-11-01 DIAGNOSIS — E1140 Type 2 diabetes mellitus with diabetic neuropathy, unspecified: Secondary | ICD-10-CM | POA: Insufficient documentation

## 2022-11-01 NOTE — Progress Notes (Signed)
This patient returns to my office for at risk foot care.  This patient requires this care by a professional since this patient will be at risk due to having type 1 diabetes.  This patient is unable to cut nails herself since the patient cannot reach her nails.These nails are painful walking and wearing shoes. She presents to the office with her daughter.    This patient presents for at risk foot care today.  General Appearance  Alert, conversant and in no acute stress.  Vascular  Dorsalis pedis and posterior tibial  pulses are  weakly palpable  bilaterally.  Capillary return is within normal limits  bilaterally. Cold feet bilaterally. Absent digital hair  B/L.  Neurologic  Senn-Weinstein monofilament wire test within normal limits  bilaterally. Muscle power within normal limits bilaterally.  Nails Thick disfigured discolored nails with subungual debris  hallux  bilaterally. No evidence of bacterial infection or drainage bilaterally.  Orthopedic  No limitations of motion  feet .  No crepitus or effusions noted.  No bony pathology or digital deformities noted.  Skin  normotropic skin with no porokeratosis noted bilaterally.  No signs of infections or ulcers noted.     Onychomycosis  Pain in right toes  Pain in left toes  Consent was obtained for treatment procedures.   Mechanical debridement of nails 1-5  bilaterally performed with a nail nipper.  Filed with dremel without incident.    Return office visit    3 months                  Told patient to return for periodic foot care and evaluation due to potential at risk complications.   Frederick Marro DPM   

## 2022-11-17 DIAGNOSIS — H2513 Age-related nuclear cataract, bilateral: Secondary | ICD-10-CM | POA: Diagnosis not present

## 2022-11-17 DIAGNOSIS — H5203 Hypermetropia, bilateral: Secondary | ICD-10-CM | POA: Diagnosis not present

## 2023-01-11 ENCOUNTER — Encounter: Payer: Self-pay | Admitting: Family Medicine

## 2023-01-11 ENCOUNTER — Ambulatory Visit: Payer: Medicare HMO | Admitting: Family Medicine

## 2023-01-11 VITALS — BP 122/78 | HR 58 | Temp 97.9°F | Ht 61.0 in | Wt 127.0 lb

## 2023-01-11 DIAGNOSIS — I10 Essential (primary) hypertension: Secondary | ICD-10-CM | POA: Diagnosis not present

## 2023-01-11 DIAGNOSIS — G3183 Dementia with Lewy bodies: Secondary | ICD-10-CM | POA: Diagnosis not present

## 2023-01-11 DIAGNOSIS — E1142 Type 2 diabetes mellitus with diabetic polyneuropathy: Secondary | ICD-10-CM

## 2023-01-11 DIAGNOSIS — F02818 Dementia in other diseases classified elsewhere, unspecified severity, with other behavioral disturbance: Secondary | ICD-10-CM | POA: Diagnosis not present

## 2023-01-11 NOTE — Progress Notes (Signed)
Provider:  Jacalyn Lefevre, MD  Careteam: Patient Care Team: Frederica Kuster, MD as PCP - General (Family Medicine) Van Clines, MD as Consulting Physician (Neurology) Lilian Kapur Rachelle Hora, DPM as Consulting Physician (Podiatry) Gwynneth Munson, Corrie Dandy (Neurology)  PLACE OF SERVICE:  [facility]  Advanced Directive information Does Patient Have a Medical Advance Directive?: No, Would patient like information on creating a medical advance directive?: No - Patient declined  No Known Allergies  Chief Complaint  Patient presents with   Medical Management of Chronic Issues    4 month follow-up. Discuss need for eye exam, td/tdap, shingrix, pneumonia, covid booster, and flu vaccine. Patient will move Friday to [facility]. Discuss use of walker. Here with daughter [name]      HPI: Patient is a 88 y.o. female patient is here for medical management of chronic problems including atherosclerosis of aorta, type 2 diabetes, dementia with Lewy bodies, hypertension, patient had been seen hallucinations with people telling her to do things that were contrary to medical advice such as not taking her medicines.  That has improved on Depakote. She takes Novolin 70/30 for diabetes seems to be doing well per daughter.  Last A1c was 7.0 in April 2024. Also takes lisinopril for blood pressure as well as renal protection blood pressure is excellent at 122/78 today She is planning to move to facility in May today and and will probably be relocating her medical care to [facility]  Review of Systems:  Review of Systems  Constitutional: Negative.   HENT:  Positive for hearing loss.   Cardiovascular: Negative.   Gastrointestinal: Negative.   Genitourinary: Negative.   Skin: Negative.   Neurological:  Positive for dizziness.  Psychiatric/Behavioral:  Positive for hallucinations and memory loss.   All other systems reviewed and are negative.   Past Medical History:   Diagnosis Date   Coronary artery disease    Dementia (HCC)    Diabetes mellitus    Glaucoma    Hallucination    Heart disease    History of heart artery stent 2009   Hypertension    Memory loss    just started on aricept   Mild cognitive impairment    Osteoarthritis    Phobia    Psychosis (HCC)    Stroke (HCC)    Vision abnormalities    Past Surgical History:  Procedure Laterality Date   CORONARY ANGIOPLASTY WITH STENT PLACEMENT     CORONARY STENT PLACEMENT     Social History:   reports that she quit smoking about 59 years ago. Her smoking use included cigarettes. She has never used smokeless tobacco. She reports that she does not drink alcohol and does not use drugs.  Family History  Problem Relation Age of Onset   Hypertension Mother    Diabetes Mother    Hypertension Father    Diabetes Father    Hypertension Sister    Diabetes Sister    Kidney disease Sister    Liver disease Brother    Hypertension Daughter    Fibromyalgia Daughter    Rheum arthritis Daughter    Breast cancer Daughter    Hypertension Son    Seizures Granddaughter    Epilepsy Granddaughter     Medications: Patient's Medications  New Prescriptions   No medications on file  Previous Medications   ASPIRIN 81 PO    Take 81 mg by mouth 2 (two) times a week.   DIVALPROEX (DEPAKOTE) 125 MG DR TABLET  Take 1 tablet (125 mg total) by mouth 2 (two) times daily.   LISINOPRIL (ZESTRIL) 10 MG TABLET    Take 1 tablet (10 mg total) by mouth daily.   NOVOLIN 70/30 RELION (70-30) 100 UNIT/ML INJECTION    Inject 25 Units into the skin daily in the afternoon.  Modified Medications   No medications on file  Discontinued Medications   No medications on file    Physical Exam:  Vitals:   01/11/23 1119  BP: 122/78  Pulse: (!) 58  Temp: 97.9 F (36.6 C)  TempSrc: Temporal  SpO2: 99%  Weight: 127 lb (57.6 kg)  Height: 5\' 1"  (1.549 m)   Body mass index is 24 kg/m. Wt Readings from Last 3  Encounters:  01/11/23 127 lb (57.6 kg)  10/20/22 127 lb (57.6 kg)  09/07/22 126 lb (57.2 kg)    Physical Exam Vitals and nursing note reviewed.  Constitutional:      Appearance: Normal appearance.  HENT:     Head:     Comments: Left EAC full of cerumen.  Irrigated and partially opened enough to see the tympanic membrane Cardiovascular:     Rate and Rhythm: Normal rate and regular rhythm.  Pulmonary:     Effort: Pulmonary effort is normal.     Breath sounds: Normal breath sounds.  Abdominal:     General: Bowel sounds are normal.  Neurological:     General: No focal deficit present.     Mental Status: She is oriented to person, place, and time.  Psychiatric:        Mood and Affect: Mood normal.        Behavior: Behavior normal.     Labs reviewed: Basic Metabolic Panel: Recent Labs    05/06/22 1836 09/07/22 1507  NA 138 141  K 3.8 4.1  CL 106 106  CO2 21* 25  GLUCOSE 98 101*  BUN 20 20  CREATININE 1.07* 0.96*  CALCIUM 9.2 9.8   Liver Function Tests: Recent Labs    05/06/22 1836  AST 24  ALT 14  ALKPHOS 71  BILITOT 0.4  PROT 7.5  ALBUMIN 3.9   No results for input(s): "LIPASE", "AMYLASE" in the last 8760 hours. No results for input(s): "AMMONIA" in the last 8760 hours. CBC: Recent Labs    05/06/22 1836  WBC 6.7  NEUTROABS 3.9  HGB 14.1  HCT 42.4  MCV 88.9  PLT 260   Lipid Panel: No results for input(s): "CHOL", "HDL", "LDLCALC", "TRIG", "CHOLHDL", "LDLDIRECT" in the last 8760 hours. TSH: No results for input(s): "TSH" in the last 8760 hours. A1C: Lab Results  Component Value Date   HGBA1C 7.0 (H) 09/07/2022     Assessment/Plan  1. Dementia, Lewy body with behavior disturbance (HCC) Improving with Depakote  2. Diabetic polyneuropathy associated with type 2 diabetes mellitus (HCC)  Sugars are doing good A1c goal continue same 3. Essential hypertension Blood pressure well-controlled on lisinopril 10 mg   Jacalyn Lefevre, MD [facility] [phone]

## 2023-01-24 ENCOUNTER — Other Ambulatory Visit: Payer: Self-pay

## 2023-01-24 NOTE — Telephone Encounter (Signed)
Patient request refill. High warning came up when trying to refill medications.   Prescriptions pended and sent to Dr. Jacquenette Shone

## 2023-01-25 MED ORDER — IGLUCOSE TEST STRIPS VI STRP: ORAL_STRIP | 12 refills | Status: DC

## 2023-01-25 MED ORDER — NOVOLIN 70/30 RELION (70-30) 100 UNIT/ML ~~LOC~~ SUSP: 25.0000 [IU] | Freq: Every day | SUBCUTANEOUS | 5 refills | Status: DC

## 2023-01-30 ENCOUNTER — Other Ambulatory Visit: Payer: Self-pay | Admitting: *Deleted

## 2023-01-30 MED ORDER — ONETOUCH VERIO VI STRP: ORAL_STRIP | 1 refills | Status: DC

## 2023-01-30 MED ORDER — ONETOUCH ULTRASOFT LANCETS MISC: 1 refills | Status: DC

## 2023-01-30 MED ORDER — INSULIN PEN NEEDLE 31G X 6 MM MISC: 1 refills | Status: AC

## 2023-01-30 MED ORDER — NOVOLIN 70/30 FLEXPEN RELION (70-30) 100 UNIT/ML ~~LOC~~ SUPN: PEN_INJECTOR | SUBCUTANEOUS | 1 refills | Status: DC

## 2023-01-30 NOTE — Telephone Encounter (Signed)
Patient caregiver, [name] called and stated patient is wanting Novolin 70/30 Flex Pen instead of the Insulin Vial and the Pen Needles.   Also stated that patient needs a Rx for One Touch Verio test strips sent to [facility].    Pended Rx's and sent to Dr. Jacquenette Shone for approval.

## 2023-02-01 ENCOUNTER — Ambulatory Visit: Payer: Medicare HMO | Admitting: Podiatry

## 2023-02-03 ENCOUNTER — Other Ambulatory Visit: Payer: Self-pay | Admitting: *Deleted

## 2023-02-03 MED ORDER — ONETOUCH ULTRASOFT LANCETS MISC: 1 refills | Status: AC

## 2023-02-10 ENCOUNTER — Ambulatory Visit: Payer: Medicare HMO | Admitting: Podiatry

## 2023-02-16 ENCOUNTER — Emergency Department (HOSPITAL_COMMUNITY): Payer: Medicare HMO

## 2023-02-16 ENCOUNTER — Other Ambulatory Visit: Payer: Self-pay

## 2023-02-16 ENCOUNTER — Emergency Department (HOSPITAL_COMMUNITY)
Admission: EM | Admit: 2023-02-16 | Discharge: 2023-02-16 | Disposition: A | Discharge status: Home / Self Care | Payer: Medicare HMO | Attending: Emergency Medicine | Admitting: Emergency Medicine

## 2023-02-16 DIAGNOSIS — R9089 Other abnormal findings on diagnostic imaging of central nervous system: Secondary | ICD-10-CM | POA: Diagnosis not present

## 2023-02-16 DIAGNOSIS — E162 Hypoglycemia, unspecified: Secondary | ICD-10-CM | POA: Diagnosis not present

## 2023-02-16 DIAGNOSIS — W06XXXA Fall from bed, initial encounter: Secondary | ICD-10-CM | POA: Insufficient documentation

## 2023-02-16 DIAGNOSIS — I10 Essential (primary) hypertension: Secondary | ICD-10-CM | POA: Diagnosis not present

## 2023-02-16 DIAGNOSIS — F0390 Unspecified dementia without behavioral disturbance: Secondary | ICD-10-CM | POA: Diagnosis not present

## 2023-02-16 DIAGNOSIS — W19XXXA Unspecified fall, initial encounter: Secondary | ICD-10-CM | POA: Diagnosis not present

## 2023-02-16 DIAGNOSIS — Z7982 Long term (current) use of aspirin: Secondary | ICD-10-CM | POA: Diagnosis not present

## 2023-02-16 DIAGNOSIS — Z79899 Other long term (current) drug therapy: Secondary | ICD-10-CM | POA: Insufficient documentation

## 2023-02-16 DIAGNOSIS — Z043 Encounter for examination and observation following other accident: Secondary | ICD-10-CM | POA: Diagnosis not present

## 2023-02-16 DIAGNOSIS — R9082 White matter disease, unspecified: Secondary | ICD-10-CM | POA: Diagnosis not present

## 2023-02-16 DIAGNOSIS — Z794 Long term (current) use of insulin: Secondary | ICD-10-CM | POA: Insufficient documentation

## 2023-02-16 DIAGNOSIS — E161 Other hypoglycemia: Secondary | ICD-10-CM | POA: Diagnosis not present

## 2023-02-16 DIAGNOSIS — I959 Hypotension, unspecified: Secondary | ICD-10-CM | POA: Diagnosis not present

## 2023-02-16 DIAGNOSIS — M461 Sacroiliitis, not elsewhere classified: Secondary | ICD-10-CM | POA: Diagnosis not present

## 2023-02-16 DIAGNOSIS — E10649 Type 1 diabetes mellitus with hypoglycemia without coma: Secondary | ICD-10-CM | POA: Diagnosis not present

## 2023-02-16 DIAGNOSIS — M160 Bilateral primary osteoarthritis of hip: Secondary | ICD-10-CM | POA: Diagnosis not present

## 2023-02-16 DIAGNOSIS — S199XXA Unspecified injury of neck, initial encounter: Secondary | ICD-10-CM | POA: Diagnosis not present

## 2023-02-16 DIAGNOSIS — Z743 Need for continuous supervision: Secondary | ICD-10-CM | POA: Diagnosis not present

## 2023-02-16 LAB — BASIC METABOLIC PANEL
Anion gap: 9 (ref 5–15)
BUN: 14 mg/dL (ref 8–23)
CO2: 24 mmol/L (ref 22–32)
Calcium: 8.8 mg/dL — ABNORMAL LOW (ref 8.9–10.3)
Chloride: 103 mmol/L (ref 98–111)
Creatinine, Ser: 0.79 mg/dL (ref 0.44–1.00)
GFR, Estimated: >60 mL/min (ref >60)
Glucose, Bld: 219 mg/dL — ABNORMAL HIGH (ref 70–99)
Potassium: 3.6 mmol/L (ref 3.5–5.1)
Sodium: 136 mmol/L (ref 135–145)

## 2023-02-16 LAB — CBC
HCT: 39 % (ref 36.0–46.0)
Hemoglobin: 12.6 g/dL (ref 12.0–15.0)
MCH: 29.9 pg (ref 26.0–34.0)
MCHC: 32.3 g/dL (ref 30.0–36.0)
MCV: 92.4 fL (ref 80.0–100.0)
Platelets: 234 10*3/uL (ref 150–400)
RBC: 4.22 MIL/uL (ref 3.87–5.11)
RDW: 12.9 % (ref 11.5–15.5)
WBC: 6.2 10*3/uL (ref 4.0–10.5)
nRBC: 0 % (ref 0.0–0.2)

## 2023-02-16 LAB — CBG MONITORING, ED
Glucose-Capillary: 192 mg/dL — ABNORMAL HIGH (ref 70–99)
Glucose-Capillary: 57 mg/dL — ABNORMAL LOW (ref 70–99)
Glucose-Capillary: 151 mg/dL — ABNORMAL HIGH (ref 70–99)
Glucose-Capillary: 179 mg/dL — ABNORMAL HIGH (ref 70–99)

## 2023-02-16 LAB — URINALYSIS, ROUTINE W REFLEX MICROSCOPIC
Bilirubin Urine: NEGATIVE
Glucose, UA: 150 mg/dL — AB
Hgb urine dipstick: NEGATIVE
Ketones, ur: NEGATIVE mg/dL
Leukocytes,Ua: NEGATIVE
Nitrite: NEGATIVE
Protein, ur: NEGATIVE mg/dL
Specific Gravity, Urine: 1.009 (ref 1.005–1.030)
pH: 7 (ref 5.0–8.0)

## 2023-02-16 MED ORDER — LISINOPRIL 10 MG PO TABS
10.0000 mg | ORAL_TABLET | Freq: Once | ORAL | Status: AC
  Administered 2023-02-16: 10 mg via ORAL
  Filled 2023-02-16: qty 1

## 2023-02-16 NOTE — ED Provider Notes (Signed)
[place] EMERGENCY DEPARTMENT AT [facility] Provider Note   CSN: [id number] Arrival date & time: 02/16/23  1200     History  Chief Complaint  Patient presents with   Hyperglycemia   Fall    Robyn [name] is a 88 y.o. female.  HPI    88 year old female comes in with chief complaint of fall and hypoglycemia. Patient accompanied by her daughter, provides substantial part of the history.  Patient lives by herself.  Yesterday according to daughter patient was doing well.  This morning she went to check on her 8:30 AM, found her on the floor.  Patient was combative, agitated, confused and she called EMS immediately.  When paramedics arrived, they noted the patient was hypoglycemic.  Patient received D50.  Her blood sugar initially was 53.  Patient currently calm.  She indicates that she just fell from her bed.  She is unsure why she fell.  She does not think that she lost consciousness.  She is not having significant pain anywhere at this time.  She denies any recent illnesses including UTI-like symptoms, abdominal pain, fevers, chills.  P.o. intake has been normal. Home Medications Prior to Admission medications   Medication Sig Start Date End Date Taking? Authorizing Provider  ASPIRIN 81 PO Take 81 mg by mouth 2 (two) times a week.    [provider]  divalproex (DEPAKOTE) 125 MG DR tablet Take 1 tablet (125 mg total) by mouth 2 (two) times daily. 06/29/22   Ngetich, Dinah C, NP  glucose blood (ONETOUCH VERIO) test strip Use to test blood sugar three times daily. Dx: E11.40 01/30/23   Venita Sheffield, MD  insulin isophane & regular human KwikPen (NOVOLIN 70/30 KWIKPEN) (70-30) 100 UNIT/ML KwikPen Inject 25 units once daily subcutaneous In the afternoon. Dx: E11.40 01/30/23   Venita Sheffield, MD  Insulin Pen Needle 31G X 6 MM MISC Use for insulin once daily. Dx: E11.40 01/30/23   Venita Sheffield, MD  Lancets [facility] ULTRASOFT) lancets Use to  test blood sugar three time daily. Dx: E11.40 02/03/23   Venita Sheffield, MD  lisinopril (ZESTRIL) 10 MG tablet Take 1 tablet (10 mg total) by mouth daily. 06/29/22   Ngetich, Donalee Citrin, NP      Allergies    Patient has no known allergies.    Review of Systems   Review of Systems  All other systems reviewed and are negative.   Physical Exam Updated Vital Signs BP (!) 203/74   Pulse 60   Resp 14   Ht 5\' 1"  (1.549 m)   Wt 58.1 kg   SpO2 98%   BMI 24.19 kg/m  Physical Exam Vitals and nursing note reviewed.  Constitutional:      Appearance: She is well-developed.  HENT:     Head: Atraumatic.  Eyes:     Extraocular Movements: Extraocular movements intact.     Pupils: Pupils are equal, round, and reactive to light.  Cardiovascular:     Rate and Rhythm: Normal rate.  Pulmonary:     Effort: Pulmonary effort is normal.  Abdominal:     Tenderness: There is no abdominal tenderness.  Musculoskeletal:     Cervical back: Normal range of motion and neck supple.  Skin:    General: Skin is warm and dry.  Neurological:     Mental Status: She is alert and oriented to person, place, and time.     ED Results / Procedures / Treatments   Labs (all labs ordered are  listed, but only abnormal results are displayed) Labs Reviewed  URINALYSIS, ROUTINE W REFLEX MICROSCOPIC - Abnormal; Notable for the following components:      Result Value   Color, Urine STRAW (*)    Glucose, UA 150 (*)    All other components within normal limits  BASIC METABOLIC PANEL - Abnormal; Notable for the following components:   Glucose, Bld 219 (*)    Calcium 8.8 (*)    All other components within normal limits  CBC  CBG MONITORING, ED  CBG MONITORING, ED  CBG MONITORING, ED    EKG None  Radiology No results found.  Procedures Procedures    Medications Ordered in ED Medications - No data to display  ED Course/ Medical Decision Making/ A&P                                 Medical Decision  Making Amount and/or Complexity of Data Reviewed Labs: ordered. Radiology: ordered.  Risk Prescription drug management.   88 year old patient comes in after sustaining what appears to be a mechanical fall.  EMS found patient to be hypoglycemic and she received D50.  Patient does have history of diabetes.  Pertinent past medical includes dementia, hypertension.  No anticoagulation use. Collateral history provided by patient's daughter, EMS.  Based on my history and exam, differential diagnosis includes: - Traumatic brain injury including intracranial hemorrhage - Long bone fractures - Contusions - Soft tissue injury - Concussion  I suspect hypoglycemia was a result of patient not getting her oral intake this morning in the setting of long-acting insulin use. Based on history and exam, it does not appear that there is underlying infectious issues.  Based on the initial assessment, the following workup was initiated basic labs to ensure that patient does not have any new renal failure and to ensure that her blood glucose remained stable in the ED.  We will also check urine to make sure there is no signs of infection and appropriate trauma radiographs will be ordered  I have independently interpreted the following imaging from the perspective of acute trauma: CT scan of the brain and the results indicate no evidence of brain bleed.  Radiology interpretations are pending.  Patient's care will be signed out to incoming team.     Final Clinical Impression(s) / ED Diagnoses Final diagnoses:  None    Rx / DC Orders ED Discharge Orders     None         Derwood Kaplan, MD 02/16/23 [phone]

## 2023-02-16 NOTE — ED Notes (Signed)
Dc instructions reviewed with pt no questions or concerns at this time.

## 2023-02-16 NOTE — ED Provider Notes (Signed)
88 year old female presents the emergency department today after a mechanical fall found to be hypoglycemic with medics.  The patient initial workup is reassuring.  Imaging is pending at the time of signout.  I will follow-up on the imaging as well as repeat blood glucose for ultimate disposition.  Plan being to closely discharge patient if she remains stable with negative imaging.  Physical Exam  BP (!) 203/74   Pulse 60   Resp 14   Ht 5\' 1"  (1.549 m)   Wt 58.1 kg   SpO2 98%   BMI 24.19 kg/m   Physical Exam Vitals and nursing note reviewed.  Constitutional:      General: She is not in acute distress.    Appearance: Normal appearance. She is not ill-appearing.  Neurological:     Mental Status: She is alert.     Procedures  Procedures  ED Course / MDM    Medical Decision Making Amount and/or Complexity of Data Reviewed Labs: ordered. Radiology: ordered.  Risk Prescription drug management.   On reassessment the patient is resting comfortably.  She remained stable here in the emergency department.  CT scans are unremarkable.  Her blood sugars did stabilize here.  She is stable for discharge.       Durwin Glaze, MD 02/16/23 2008

## 2023-02-16 NOTE — Discharge Instructions (Signed)
Your workup today was reassuring.  Please follow-up with your doctor and return to the emergency department for worsening symptoms.

## 2023-02-16 NOTE — ED Triage Notes (Signed)
Pt arrived [facility] for hypoglycemia. Daughter called EMS stating pt was AMS and talking out of her head. [facility] noted CBG was 35. [facility] gave 12gm glucose po and 12.5 of D50 and CBG increased to 231. Pt voiced that she slid out of bed this morning with right hip pain and possible head injury. Pt has hx of dementia. [facility] 22 G RFA.

## 2023-02-17 ENCOUNTER — Ambulatory Visit: Payer: Medicare HMO | Admitting: Podiatry

## 2023-02-21 ENCOUNTER — Encounter: Payer: Self-pay | Admitting: Sports Medicine

## 2023-02-21 ENCOUNTER — Ambulatory Visit (INDEPENDENT_AMBULATORY_CARE_PROVIDER_SITE_OTHER): Payer: Medicare HMO | Admitting: Sports Medicine

## 2023-02-21 VITALS — BP 120/70 | HR 73 | Temp 96.3°F | Resp 16 | Ht 61.0 in | Wt 127.6 lb

## 2023-02-21 DIAGNOSIS — E162 Hypoglycemia, unspecified: Secondary | ICD-10-CM

## 2023-02-21 DIAGNOSIS — R2689 Other abnormalities of gait and mobility: Secondary | ICD-10-CM | POA: Diagnosis not present

## 2023-02-21 DIAGNOSIS — R413 Other amnesia: Secondary | ICD-10-CM

## 2023-02-21 DIAGNOSIS — Z23 Encounter for immunization: Secondary | ICD-10-CM

## 2023-02-21 DIAGNOSIS — M159 Polyosteoarthritis, unspecified: Secondary | ICD-10-CM

## 2023-02-21 NOTE — Progress Notes (Signed)
Careteam: Patient Care Team: Venita Sheffield, MD as PCP - General (Internal Medicine) Van Clines, MD as Consulting Physician (Neurology) Edwin Cap, DPM as Consulting Physician (Podiatry) Gwynneth Munson, Corrie Dandy (Neurology)  PLACE OF SERVICE:  [facility]  Advanced Directive information Does Patient Have a Medical Advance Directive?: No, Would patient like information on creating a medical advance directive?: No - Patient declined  No Known Allergies  Chief Complaint  Patient presents with   Transitions Of Care    [facility] 02/16/2023     HPI: Patient is a 88 y.o. female is here for follow up  Accompanied by her daughter Daughter states  pt takes insulin  on her own  Currently on  25 units  daily  Pt states she does not eat much better than she used to be  Weight stable  Daughter states that her mom's balance is poor Pt does not like to use walker or cane Does not drink much water     Memory loss Daughter states that pt hallucinates  She thinks there are people in her house  On depakote Manageable as per daughter        Review of Systems:  Review of Systems  Constitutional:  Negative for chills and fever.  HENT:  Negative for congestion and sore throat.   Eyes:  Negative for double vision.  Respiratory:  Negative for cough, sputum production and shortness of breath.   Cardiovascular:  Negative for chest pain, palpitations and leg swelling.  Gastrointestinal:  Negative for abdominal pain, heartburn and nausea.  Genitourinary:  Negative for dysuria, frequency and hematuria.  Musculoskeletal:  Positive for falls. Negative for myalgias.  Neurological:  Negative for dizziness, sensory change and focal weakness.  Psychiatric/Behavioral:  Positive for hallucinations and memory loss.     Past Medical History:  Diagnosis Date   Coronary artery disease    Dementia (HCC)    Diabetes mellitus    Glaucoma    Hallucination    Heart disease    History of  heart artery stent 2009   Hypertension    Memory loss    just started on aricept   Mild cognitive impairment    Osteoarthritis    Phobia    Psychosis (HCC)    Stroke (HCC)    Vision abnormalities    Past Surgical History:  Procedure Laterality Date   CORONARY ANGIOPLASTY WITH STENT PLACEMENT     CORONARY STENT PLACEMENT     Social History:   reports that she quit smoking about 59 years ago. Her smoking use included cigarettes. She has never used smokeless tobacco. She reports that she does not drink alcohol and does not use drugs.  Family History  Problem Relation Age of Onset   Hypertension Mother    Diabetes Mother    Hypertension Father    Diabetes Father    Hypertension Sister    Diabetes Sister    Kidney disease Sister    Liver disease Brother    Hypertension Daughter    Fibromyalgia Daughter    Rheum arthritis Daughter    Breast cancer Daughter    Hypertension Son    Seizures Granddaughter    Epilepsy Granddaughter     Medications: Patient's Medications  New Prescriptions   No medications on file  Previous Medications   ASPIRIN 81 PO    Take 81 mg by mouth 2 (two) times a week.   DIVALPROEX (DEPAKOTE) 125 MG DR TABLET    Take 1  tablet (125 mg total) by mouth 2 (two) times daily.   GLUCOSE BLOOD (ONETOUCH VERIO) TEST STRIP    Use to test blood sugar three times daily. Dx: E11.40   INSULIN ISOPHANE & REGULAR HUMAN KWIKPEN (NOVOLIN 70/30 KWIKPEN) (70-30) 100 UNIT/ML KWIKPEN    Inject 25 units once daily subcutaneous In the afternoon. Dx: E11.40   INSULIN PEN NEEDLE 31G X 6 MM MISC    Use for insulin once daily. Dx: E11.40   LANCETS (ONETOUCH ULTRASOFT) LANCETS    Use to test blood sugar three time daily. Dx: E11.40   LISINOPRIL (ZESTRIL) 10 MG TABLET    Take 1 tablet (10 mg total) by mouth daily.  Modified Medications   No medications on file  Discontinued Medications   No medications on file    Physical Exam:  Vitals:   02/21/23 1350  BP: 120/70   Pulse: 73  Resp: 16  Temp: (!) 96.3 F (35.7 C)  SpO2: 98%  Weight: 127 lb 9.6 oz (57.9 kg)  Height: 5\' 1"  (1.549 m)   Body mass index is 24.11 kg/m. Wt Readings from Last 3 Encounters:  02/21/23 127 lb 9.6 oz (57.9 kg)  02/16/23 128 lb (58.1 kg)  01/11/23 127 lb (57.6 kg)    Physical Exam Constitutional:      Appearance: Normal appearance.  HENT:     Head: Normocephalic and atraumatic.  Cardiovascular:     Rate and Rhythm: Normal rate and regular rhythm.     Heart sounds: No murmur heard. Pulmonary:     Effort: Pulmonary effort is normal. No respiratory distress.     Breath sounds: Normal breath sounds. No wheezing.  Abdominal:     General: Bowel sounds are normal. There is no distension.     Tenderness: There is no abdominal tenderness. There is no guarding or rebound.  Musculoskeletal:        General: No swelling or tenderness.  Skin:    General: Skin is dry.  Neurological:     Mental Status: She is alert. Mental status is at baseline.     Sensory: No sensory deficit.     Motor: No weakness.      Labs reviewed: Basic Metabolic Panel: Recent Labs    05/06/22 1836 09/07/22 1507 02/16/23 1219  NA 138 141 136  K 3.8 4.1 3.6  CL 106 106 103  CO2 21* 25 24  GLUCOSE 98 101* 219*  BUN 20 20 14   CREATININE 1.07* 0.96* 0.79  CALCIUM 9.2 9.8 8.8*   Liver Function Tests: Recent Labs    05/06/22 1836  AST 24  ALT 14  ALKPHOS 71  BILITOT 0.4  PROT 7.5  ALBUMIN 3.9   No results for input(s): "LIPASE", "AMYLASE" in the last 8760 hours. No results for input(s): "AMMONIA" in the last 8760 hours. CBC: Recent Labs    05/06/22 1836 02/16/23 1219  WBC 6.7 6.2  NEUTROABS 3.9  --   HGB 14.1 12.6  HCT 42.4 39.0  MCV 88.9 92.4  PLT 260 234   Lipid Panel: No results for input(s): "CHOL", "HDL", "LDLCALC", "TRIG", "CHOLHDL", "LDLDIRECT" in the last 8760 hours. TSH: No results for input(s): "TSH" in the last 8760 hours. A1C: Lab Results  Component Value  Date   HGBA1C 7.0 (H) 09/07/2022     Assessment/Plan   1. Need for influenza vaccination  - Flu Vaccine Trivalent High Dose (Fluad)  2. Hypoglycemia  Instructed daughter to administer insulin  Check BG  Will do  video visit in 4 weeks   3. Memory deficits Will refer to Home health PT for balance and muscle strengthening exercises - Ambulatory referral to Home Health  4. Balance problems Instructed to use walker at all times - Ambulatory referral to Home Health  5. Generalized OA  Take tylenol prn for pain  - Ambulatory referral to Home Health    No follow-ups on file.: 4 weeks

## 2023-02-23 DIAGNOSIS — E11649 Type 2 diabetes mellitus with hypoglycemia without coma: Secondary | ICD-10-CM | POA: Diagnosis not present

## 2023-02-23 DIAGNOSIS — M159 Polyosteoarthritis, unspecified: Secondary | ICD-10-CM | POA: Diagnosis not present

## 2023-02-23 DIAGNOSIS — E1165 Type 2 diabetes mellitus with hyperglycemia: Secondary | ICD-10-CM | POA: Diagnosis not present

## 2023-02-23 DIAGNOSIS — Z794 Long term (current) use of insulin: Secondary | ICD-10-CM | POA: Diagnosis not present

## 2023-02-23 DIAGNOSIS — Z87891 Personal history of nicotine dependence: Secondary | ICD-10-CM | POA: Diagnosis not present

## 2023-02-23 DIAGNOSIS — I10 Essential (primary) hypertension: Secondary | ICD-10-CM | POA: Diagnosis not present

## 2023-02-23 DIAGNOSIS — F0390 Unspecified dementia without behavioral disturbance: Secondary | ICD-10-CM | POA: Diagnosis not present

## 2023-02-23 DIAGNOSIS — Z7982 Long term (current) use of aspirin: Secondary | ICD-10-CM | POA: Diagnosis not present

## 2023-02-23 DIAGNOSIS — I2510 Atherosclerotic heart disease of native coronary artery without angina pectoris: Secondary | ICD-10-CM | POA: Diagnosis not present

## 2023-02-23 DIAGNOSIS — Z9181 History of falling: Secondary | ICD-10-CM | POA: Diagnosis not present

## 2023-02-24 ENCOUNTER — Telehealth: Payer: Medicare HMO

## 2023-02-24 NOTE — Telephone Encounter (Signed)
Monique from [facility] called and states that patient needs verbal orders for PT 1 week one, 2 week three, and 1 week three. Verbal orders given.

## 2023-02-28 DIAGNOSIS — F0390 Unspecified dementia without behavioral disturbance: Secondary | ICD-10-CM | POA: Diagnosis not present

## 2023-02-28 DIAGNOSIS — Z794 Long term (current) use of insulin: Secondary | ICD-10-CM | POA: Diagnosis not present

## 2023-02-28 DIAGNOSIS — Z9181 History of falling: Secondary | ICD-10-CM | POA: Diagnosis not present

## 2023-02-28 DIAGNOSIS — I10 Essential (primary) hypertension: Secondary | ICD-10-CM | POA: Diagnosis not present

## 2023-02-28 DIAGNOSIS — M159 Polyosteoarthritis, unspecified: Secondary | ICD-10-CM | POA: Diagnosis not present

## 2023-02-28 DIAGNOSIS — E11649 Type 2 diabetes mellitus with hypoglycemia without coma: Secondary | ICD-10-CM | POA: Diagnosis not present

## 2023-02-28 DIAGNOSIS — I2510 Atherosclerotic heart disease of native coronary artery without angina pectoris: Secondary | ICD-10-CM | POA: Diagnosis not present

## 2023-02-28 DIAGNOSIS — Z7982 Long term (current) use of aspirin: Secondary | ICD-10-CM | POA: Diagnosis not present

## 2023-02-28 DIAGNOSIS — E1165 Type 2 diabetes mellitus with hyperglycemia: Secondary | ICD-10-CM | POA: Diagnosis not present

## 2023-02-28 DIAGNOSIS — Z87891 Personal history of nicotine dependence: Secondary | ICD-10-CM | POA: Diagnosis not present

## 2023-03-02 ENCOUNTER — Telehealth: Payer: Self-pay

## 2023-03-02 DIAGNOSIS — I2510 Atherosclerotic heart disease of native coronary artery without angina pectoris: Secondary | ICD-10-CM | POA: Diagnosis not present

## 2023-03-02 DIAGNOSIS — Z9181 History of falling: Secondary | ICD-10-CM | POA: Diagnosis not present

## 2023-03-02 DIAGNOSIS — F02818 Dementia in other diseases classified elsewhere, unspecified severity, with other behavioral disturbance: Secondary | ICD-10-CM

## 2023-03-02 DIAGNOSIS — Z7982 Long term (current) use of aspirin: Secondary | ICD-10-CM | POA: Diagnosis not present

## 2023-03-02 DIAGNOSIS — Z87891 Personal history of nicotine dependence: Secondary | ICD-10-CM | POA: Diagnosis not present

## 2023-03-02 DIAGNOSIS — E1165 Type 2 diabetes mellitus with hyperglycemia: Secondary | ICD-10-CM | POA: Diagnosis not present

## 2023-03-02 DIAGNOSIS — F0390 Unspecified dementia without behavioral disturbance: Secondary | ICD-10-CM | POA: Diagnosis not present

## 2023-03-02 DIAGNOSIS — I10 Essential (primary) hypertension: Secondary | ICD-10-CM | POA: Diagnosis not present

## 2023-03-02 DIAGNOSIS — M150 Primary generalized (osteo)arthritis: Secondary | ICD-10-CM

## 2023-03-02 DIAGNOSIS — E11649 Type 2 diabetes mellitus with hypoglycemia without coma: Secondary | ICD-10-CM | POA: Diagnosis not present

## 2023-03-02 DIAGNOSIS — Z794 Long term (current) use of insulin: Secondary | ICD-10-CM | POA: Diagnosis not present

## 2023-03-02 DIAGNOSIS — M159 Polyosteoarthritis, unspecified: Secondary | ICD-10-CM | POA: Diagnosis not present

## 2023-03-02 NOTE — Telephone Encounter (Signed)
Patients daughter called requesting an order for a basic walker. Robyn [name] plans to call the insurance, find out where to send order, and call back with that information. Order is pending for review and completion if Venita Sheffield, MD is in agreement with request

## 2023-03-06 NOTE — Telephone Encounter (Signed)
Patient daughter [name] called back with info: [facility] Fax: [phone]  Order and notes faxed as requested.

## 2023-03-07 DIAGNOSIS — F0390 Unspecified dementia without behavioral disturbance: Secondary | ICD-10-CM | POA: Diagnosis not present

## 2023-03-07 DIAGNOSIS — I10 Essential (primary) hypertension: Secondary | ICD-10-CM | POA: Diagnosis not present

## 2023-03-07 DIAGNOSIS — E1165 Type 2 diabetes mellitus with hyperglycemia: Secondary | ICD-10-CM | POA: Diagnosis not present

## 2023-03-07 DIAGNOSIS — Z87891 Personal history of nicotine dependence: Secondary | ICD-10-CM | POA: Diagnosis not present

## 2023-03-07 DIAGNOSIS — Z9181 History of falling: Secondary | ICD-10-CM | POA: Diagnosis not present

## 2023-03-07 DIAGNOSIS — Z794 Long term (current) use of insulin: Secondary | ICD-10-CM | POA: Diagnosis not present

## 2023-03-07 DIAGNOSIS — E11649 Type 2 diabetes mellitus with hypoglycemia without coma: Secondary | ICD-10-CM | POA: Diagnosis not present

## 2023-03-07 DIAGNOSIS — Z23 Encounter for immunization: Secondary | ICD-10-CM | POA: Diagnosis not present

## 2023-03-07 DIAGNOSIS — Z7982 Long term (current) use of aspirin: Secondary | ICD-10-CM | POA: Diagnosis not present

## 2023-03-07 DIAGNOSIS — M159 Polyosteoarthritis, unspecified: Secondary | ICD-10-CM | POA: Diagnosis not present

## 2023-03-07 DIAGNOSIS — I2510 Atherosclerotic heart disease of native coronary artery without angina pectoris: Secondary | ICD-10-CM | POA: Diagnosis not present

## 2023-03-07 DIAGNOSIS — E1140 Type 2 diabetes mellitus with diabetic neuropathy, unspecified: Secondary | ICD-10-CM | POA: Diagnosis not present

## 2023-03-09 DIAGNOSIS — Z87891 Personal history of nicotine dependence: Secondary | ICD-10-CM | POA: Diagnosis not present

## 2023-03-09 DIAGNOSIS — F0390 Unspecified dementia without behavioral disturbance: Secondary | ICD-10-CM | POA: Diagnosis not present

## 2023-03-09 DIAGNOSIS — I2510 Atherosclerotic heart disease of native coronary artery without angina pectoris: Secondary | ICD-10-CM | POA: Diagnosis not present

## 2023-03-09 DIAGNOSIS — Z7982 Long term (current) use of aspirin: Secondary | ICD-10-CM | POA: Diagnosis not present

## 2023-03-09 DIAGNOSIS — I10 Essential (primary) hypertension: Secondary | ICD-10-CM | POA: Diagnosis not present

## 2023-03-09 DIAGNOSIS — Z9181 History of falling: Secondary | ICD-10-CM | POA: Diagnosis not present

## 2023-03-09 DIAGNOSIS — Z794 Long term (current) use of insulin: Secondary | ICD-10-CM | POA: Diagnosis not present

## 2023-03-09 DIAGNOSIS — E11649 Type 2 diabetes mellitus with hypoglycemia without coma: Secondary | ICD-10-CM | POA: Diagnosis not present

## 2023-03-09 DIAGNOSIS — M159 Polyosteoarthritis, unspecified: Secondary | ICD-10-CM | POA: Diagnosis not present

## 2023-03-09 DIAGNOSIS — E1165 Type 2 diabetes mellitus with hyperglycemia: Secondary | ICD-10-CM | POA: Diagnosis not present

## 2023-03-10 ENCOUNTER — Encounter: Payer: Self-pay | Admitting: Podiatry

## 2023-03-10 ENCOUNTER — Ambulatory Visit (INDEPENDENT_AMBULATORY_CARE_PROVIDER_SITE_OTHER): Payer: Medicare HMO | Admitting: Podiatry

## 2023-03-10 DIAGNOSIS — B351 Tinea unguium: Secondary | ICD-10-CM

## 2023-03-10 DIAGNOSIS — E1142 Type 2 diabetes mellitus with diabetic polyneuropathy: Secondary | ICD-10-CM | POA: Diagnosis not present

## 2023-03-10 DIAGNOSIS — M79674 Pain in right toe(s): Secondary | ICD-10-CM

## 2023-03-10 DIAGNOSIS — M79675 Pain in left toe(s): Secondary | ICD-10-CM | POA: Diagnosis not present

## 2023-03-10 NOTE — Progress Notes (Signed)
  Subjective:  Patient ID: Robyn [name], female    DOB: 1934/11/21,   MRN: [id number]  No chief complaint on file.   88 y.o. female presents for concern of thickened elongated and painful nails that are difficult to trim. Requesting to have them trimmed today. Relates burning and tingling in their feet. Patient is diabetic and last A1c was  Lab Results  Component Value Date   HGBA1C 7.0 (H) 09/07/2022   .   PCP:  Venita Sheffield, MD    . Denies any other pedal complaints. Denies n/v/f/c.   Past Medical History:  Diagnosis Date   Coronary artery disease    Dementia (HCC)    Diabetes mellitus    Glaucoma    Hallucination    Heart disease    History of heart artery stent 2009   Hypertension    Memory loss    just started on aricept   Mild cognitive impairment    Osteoarthritis    Phobia    Psychosis (HCC)    Stroke (HCC)    Vision abnormalities     Objective:  Physical Exam: Vascular: DP/PT pulses 2/4 bilateral. CFT <3 seconds. Absent hair growth on digits. Edema noted to bilateral lower extremities. Xerosis noted bilaterally.  Skin. No lacerations or abrasions bilateral feet. Nails 1-5 bilateral  are thickened discolored and elongated with subungual debris.  Musculoskeletal: MMT 5/5 bilateral lower extremities in DF, PF, Inversion and Eversion. Deceased ROM in DF of ankle joint.  Neurological: Sensation intact to light touch. Protective sensation diminished bilateral.    Assessment:   1. Pain due to onychomycosis of toenails of both feet   2. Diabetic polyneuropathy associated with type 2 diabetes mellitus (HCC)      Plan:  Patient was evaluated and treated and all questions answered. -Discussed and educated patient on diabetic foot care, especially with  regards to the vascular, neurological and musculoskeletal systems.  -Stressed the importance of good glycemic control and the detriment of not  controlling glucose levels in relation to the  foot. -Discussed supportive shoes at all times and checking feet regularly.  -Mechanically debrided all nails 1-5 bilateral using sterile nail nipper and filed with dremel without incident  -Answered all patient questions -Patient to return  in 3 months for at risk foot care -Patient advised to call the office if any problems or questions arise in the meantime.   Louann Sjogren, DPM

## 2023-03-14 DIAGNOSIS — Z794 Long term (current) use of insulin: Secondary | ICD-10-CM | POA: Diagnosis not present

## 2023-03-14 DIAGNOSIS — Z9181 History of falling: Secondary | ICD-10-CM | POA: Diagnosis not present

## 2023-03-14 DIAGNOSIS — F0390 Unspecified dementia without behavioral disturbance: Secondary | ICD-10-CM | POA: Diagnosis not present

## 2023-03-14 DIAGNOSIS — E1165 Type 2 diabetes mellitus with hyperglycemia: Secondary | ICD-10-CM | POA: Diagnosis not present

## 2023-03-14 DIAGNOSIS — I10 Essential (primary) hypertension: Secondary | ICD-10-CM | POA: Diagnosis not present

## 2023-03-14 DIAGNOSIS — Z7982 Long term (current) use of aspirin: Secondary | ICD-10-CM | POA: Diagnosis not present

## 2023-03-14 DIAGNOSIS — I2510 Atherosclerotic heart disease of native coronary artery without angina pectoris: Secondary | ICD-10-CM | POA: Diagnosis not present

## 2023-03-14 DIAGNOSIS — Z87891 Personal history of nicotine dependence: Secondary | ICD-10-CM | POA: Diagnosis not present

## 2023-03-14 DIAGNOSIS — E11649 Type 2 diabetes mellitus with hypoglycemia without coma: Secondary | ICD-10-CM | POA: Diagnosis not present

## 2023-03-14 DIAGNOSIS — M159 Polyosteoarthritis, unspecified: Secondary | ICD-10-CM | POA: Diagnosis not present

## 2023-03-16 ENCOUNTER — Ambulatory Visit: Payer: Medicare HMO | Admitting: Nurse Practitioner

## 2023-03-16 DIAGNOSIS — Z87891 Personal history of nicotine dependence: Secondary | ICD-10-CM | POA: Diagnosis not present

## 2023-03-16 DIAGNOSIS — I2510 Atherosclerotic heart disease of native coronary artery without angina pectoris: Secondary | ICD-10-CM | POA: Diagnosis not present

## 2023-03-16 DIAGNOSIS — E1165 Type 2 diabetes mellitus with hyperglycemia: Secondary | ICD-10-CM | POA: Diagnosis not present

## 2023-03-16 DIAGNOSIS — Z9181 History of falling: Secondary | ICD-10-CM | POA: Diagnosis not present

## 2023-03-16 DIAGNOSIS — E11649 Type 2 diabetes mellitus with hypoglycemia without coma: Secondary | ICD-10-CM | POA: Diagnosis not present

## 2023-03-16 DIAGNOSIS — Z7982 Long term (current) use of aspirin: Secondary | ICD-10-CM | POA: Diagnosis not present

## 2023-03-16 DIAGNOSIS — F0390 Unspecified dementia without behavioral disturbance: Secondary | ICD-10-CM | POA: Diagnosis not present

## 2023-03-16 DIAGNOSIS — I10 Essential (primary) hypertension: Secondary | ICD-10-CM | POA: Diagnosis not present

## 2023-03-16 DIAGNOSIS — Z794 Long term (current) use of insulin: Secondary | ICD-10-CM | POA: Diagnosis not present

## 2023-03-16 DIAGNOSIS — M159 Polyosteoarthritis, unspecified: Secondary | ICD-10-CM | POA: Diagnosis not present

## 2023-03-21 ENCOUNTER — Ambulatory Visit: Payer: Medicare HMO | Admitting: Sports Medicine

## 2023-03-22 ENCOUNTER — Encounter (INDEPENDENT_AMBULATORY_CARE_PROVIDER_SITE_OTHER): Payer: Medicare HMO | Admitting: Sports Medicine

## 2023-03-23 DIAGNOSIS — I2510 Atherosclerotic heart disease of native coronary artery without angina pectoris: Secondary | ICD-10-CM | POA: Diagnosis not present

## 2023-03-23 DIAGNOSIS — Z7982 Long term (current) use of aspirin: Secondary | ICD-10-CM | POA: Diagnosis not present

## 2023-03-23 DIAGNOSIS — E11649 Type 2 diabetes mellitus with hypoglycemia without coma: Secondary | ICD-10-CM | POA: Diagnosis not present

## 2023-03-23 DIAGNOSIS — Z794 Long term (current) use of insulin: Secondary | ICD-10-CM | POA: Diagnosis not present

## 2023-03-23 DIAGNOSIS — E1165 Type 2 diabetes mellitus with hyperglycemia: Secondary | ICD-10-CM | POA: Diagnosis not present

## 2023-03-23 DIAGNOSIS — Z9181 History of falling: Secondary | ICD-10-CM | POA: Diagnosis not present

## 2023-03-23 DIAGNOSIS — Z87891 Personal history of nicotine dependence: Secondary | ICD-10-CM | POA: Diagnosis not present

## 2023-03-23 DIAGNOSIS — I10 Essential (primary) hypertension: Secondary | ICD-10-CM | POA: Diagnosis not present

## 2023-03-23 DIAGNOSIS — M159 Polyosteoarthritis, unspecified: Secondary | ICD-10-CM | POA: Diagnosis not present

## 2023-03-23 DIAGNOSIS — F0390 Unspecified dementia without behavioral disturbance: Secondary | ICD-10-CM | POA: Diagnosis not present

## 2023-03-24 NOTE — Progress Notes (Signed)
This encounter was created in error - please disregard.

## 2023-03-28 DIAGNOSIS — Z9181 History of falling: Secondary | ICD-10-CM | POA: Diagnosis not present

## 2023-03-28 DIAGNOSIS — M159 Polyosteoarthritis, unspecified: Secondary | ICD-10-CM | POA: Diagnosis not present

## 2023-03-28 DIAGNOSIS — F0390 Unspecified dementia without behavioral disturbance: Secondary | ICD-10-CM | POA: Diagnosis not present

## 2023-03-28 DIAGNOSIS — E1165 Type 2 diabetes mellitus with hyperglycemia: Secondary | ICD-10-CM | POA: Diagnosis not present

## 2023-03-28 DIAGNOSIS — Z794 Long term (current) use of insulin: Secondary | ICD-10-CM | POA: Diagnosis not present

## 2023-03-28 DIAGNOSIS — Z7982 Long term (current) use of aspirin: Secondary | ICD-10-CM | POA: Diagnosis not present

## 2023-03-28 DIAGNOSIS — Z87891 Personal history of nicotine dependence: Secondary | ICD-10-CM | POA: Diagnosis not present

## 2023-03-28 DIAGNOSIS — E11649 Type 2 diabetes mellitus with hypoglycemia without coma: Secondary | ICD-10-CM | POA: Diagnosis not present

## 2023-03-28 DIAGNOSIS — I2510 Atherosclerotic heart disease of native coronary artery without angina pectoris: Secondary | ICD-10-CM | POA: Diagnosis not present

## 2023-03-28 DIAGNOSIS — I10 Essential (primary) hypertension: Secondary | ICD-10-CM | POA: Diagnosis not present

## 2023-04-04 ENCOUNTER — Other Ambulatory Visit: Payer: Self-pay

## 2023-04-04 ENCOUNTER — Emergency Department (HOSPITAL_COMMUNITY)
Admission: EM | Admit: 2023-04-04 | Discharge: 2023-04-04 | Disposition: A | Discharge status: Home / Self Care | Payer: Medicare HMO | Attending: Emergency Medicine | Admitting: Emergency Medicine

## 2023-04-04 ENCOUNTER — Encounter (HOSPITAL_COMMUNITY): Payer: Self-pay | Admitting: Emergency Medicine

## 2023-04-04 ENCOUNTER — Emergency Department (HOSPITAL_COMMUNITY): Payer: Medicare HMO

## 2023-04-04 ENCOUNTER — Encounter: Payer: Self-pay | Admitting: Sports Medicine

## 2023-04-04 ENCOUNTER — Ambulatory Visit: Payer: Medicare HMO | Admitting: Physician Assistant

## 2023-04-04 ENCOUNTER — Telehealth (INDEPENDENT_AMBULATORY_CARE_PROVIDER_SITE_OTHER): Payer: Medicare HMO | Admitting: Sports Medicine

## 2023-04-04 DIAGNOSIS — I10 Essential (primary) hypertension: Secondary | ICD-10-CM | POA: Insufficient documentation

## 2023-04-04 DIAGNOSIS — E119 Type 2 diabetes mellitus without complications: Secondary | ICD-10-CM | POA: Insufficient documentation

## 2023-04-04 DIAGNOSIS — F03A0 Unspecified dementia, mild, without behavioral disturbance, psychotic disturbance, mood disturbance, and anxiety: Secondary | ICD-10-CM | POA: Insufficient documentation

## 2023-04-04 DIAGNOSIS — Z79899 Other long term (current) drug therapy: Secondary | ICD-10-CM | POA: Diagnosis not present

## 2023-04-04 DIAGNOSIS — Z794 Long term (current) use of insulin: Secondary | ICD-10-CM | POA: Diagnosis not present

## 2023-04-04 DIAGNOSIS — M25552 Pain in left hip: Secondary | ICD-10-CM

## 2023-04-04 DIAGNOSIS — Z8673 Personal history of transient ischemic attack (TIA), and cerebral infarction without residual deficits: Secondary | ICD-10-CM | POA: Insufficient documentation

## 2023-04-04 DIAGNOSIS — R296 Repeated falls: Secondary | ICD-10-CM

## 2023-04-04 DIAGNOSIS — S20212A Contusion of left front wall of thorax, initial encounter: Secondary | ICD-10-CM | POA: Insufficient documentation

## 2023-04-04 DIAGNOSIS — I2510 Atherosclerotic heart disease of native coronary artery without angina pectoris: Secondary | ICD-10-CM | POA: Diagnosis not present

## 2023-04-04 DIAGNOSIS — W01198A Fall on same level from slipping, tripping and stumbling with subsequent striking against other object, initial encounter: Secondary | ICD-10-CM | POA: Insufficient documentation

## 2023-04-04 DIAGNOSIS — R0789 Other chest pain: Secondary | ICD-10-CM | POA: Diagnosis present

## 2023-04-04 DIAGNOSIS — S7002XA Contusion of left hip, initial encounter: Secondary | ICD-10-CM | POA: Diagnosis not present

## 2023-04-04 DIAGNOSIS — M47816 Spondylosis without myelopathy or radiculopathy, lumbar region: Secondary | ICD-10-CM | POA: Diagnosis not present

## 2023-04-04 DIAGNOSIS — Z7982 Long term (current) use of aspirin: Secondary | ICD-10-CM | POA: Insufficient documentation

## 2023-04-04 DIAGNOSIS — I700 Atherosclerosis of aorta: Secondary | ICD-10-CM | POA: Diagnosis not present

## 2023-04-04 DIAGNOSIS — R0781 Pleurodynia: Secondary | ICD-10-CM | POA: Diagnosis not present

## 2023-04-04 DIAGNOSIS — W19XXXA Unspecified fall, initial encounter: Secondary | ICD-10-CM

## 2023-04-04 LAB — BASIC METABOLIC PANEL
Anion gap: 10 (ref 5–15)
BUN: 17 mg/dL (ref 8–23)
CO2: 22 mmol/L (ref 22–32)
Calcium: 9 mg/dL (ref 8.9–10.3)
Chloride: 108 mmol/L (ref 98–111)
Creatinine, Ser: 0.88 mg/dL (ref 0.44–1.00)
GFR, Estimated: >60 mL/min (ref >60)
Glucose, Bld: 72 mg/dL (ref 70–99)
Potassium: 4.2 mmol/L (ref 3.5–5.1)
Sodium: 140 mmol/L (ref 135–145)

## 2023-04-04 LAB — CBC WITH DIFFERENTIAL/PLATELET
Abs Immature Granulocytes: 0.02 10*3/uL (ref 0.00–0.07)
Basophils Absolute: 0 10*3/uL (ref 0.0–0.1)
Basophils Relative: 1 %
Eosinophils Absolute: 0.1 10*3/uL (ref 0.0–0.5)
Eosinophils Relative: 1 %
HCT: 40.1 % (ref 36.0–46.0)
Hemoglobin: 12.4 g/dL (ref 12.0–15.0)
Immature Granulocytes: 0 %
Lymphocytes Relative: 28 %
Lymphs Abs: 1.8 10*3/uL (ref 0.7–4.0)
MCH: 29.5 pg (ref 26.0–34.0)
MCHC: 30.9 g/dL (ref 30.0–36.0)
MCV: 95.2 fL (ref 80.0–100.0)
Monocytes Absolute: 0.5 10*3/uL (ref 0.1–1.0)
Monocytes Relative: 7 %
Neutro Abs: 4.1 10*3/uL (ref 1.7–7.7)
Neutrophils Relative %: 63 %
Platelets: 209 10*3/uL (ref 150–400)
RBC: 4.21 MIL/uL (ref 3.87–5.11)
RDW: 12.5 % (ref 11.5–15.5)
WBC: 6.5 10*3/uL (ref 4.0–10.5)
nRBC: 0 % (ref 0.0–0.2)

## 2023-04-04 LAB — URINALYSIS, ROUTINE W REFLEX MICROSCOPIC
Bilirubin Urine: NEGATIVE
Glucose, UA: NEGATIVE mg/dL
Hgb urine dipstick: NEGATIVE
Ketones, ur: NEGATIVE mg/dL
Leukocytes,Ua: NEGATIVE
Nitrite: NEGATIVE
Protein, ur: NEGATIVE mg/dL
Specific Gravity, Urine: 1.018 (ref 1.005–1.030)
pH: 7 (ref 5.0–8.0)

## 2023-04-04 LAB — TROPONIN I (HIGH SENSITIVITY): Troponin I (High Sensitivity): 5 ng/L (ref <18)

## 2023-04-04 MED ORDER — LIDOCAINE 5 % EX PTCH
1.0000 | MEDICATED_PATCH | CUTANEOUS | Status: DC
  Administered 2023-04-04: 1 via TRANSDERMAL
  Filled 2023-04-04: qty 1

## 2023-04-04 MED ORDER — ACETAMINOPHEN 325 MG PO TABS
650.0000 mg | ORAL_TABLET | Freq: Once | ORAL | Status: AC
  Administered 2023-04-04: 650 mg via ORAL
  Filled 2023-04-04: qty 2

## 2023-04-04 MED ORDER — LIDOCAINE 5 % EX PTCH: 1.0000 | MEDICATED_PATCH | CUTANEOUS | 0 refills | Status: DC

## 2023-04-04 MED ORDER — DICLOFENAC SODIUM 1 % EX GEL: 2.0000 g | Freq: Four times a day (QID) | CUTANEOUS | 0 refills | Status: AC

## 2023-04-04 NOTE — Discharge Instructions (Signed)
Your x-rays and lab tests today are reassuring, there is no sign of broken bones or rib fractures on today's images.  I do recommend applying a pain patch to your ribs daily, you may also take prescription strength Tylenol per label instructions, no prescription needed.  Your pain may also heal quicker by applying gentle heating pad for 20 minutes 3-4 times daily to your sites of pain.

## 2023-04-04 NOTE — ED Notes (Signed)
Pt returned from [facility] pt to restroom with hat for urine sample

## 2023-04-04 NOTE — Progress Notes (Unsigned)
   This service is provided via telemedicine  No vital signs collected/recorded due to the encounter was a telemedicine visit.   Location of patient (ex: home, work):  Home  Patient consents to a telephone visit:  Yes  Location of the provider (ex: office, home):  [employer].   Name of any referring provider:  Venita Sheffield, MD   Names of all persons participating in the telemedicine service and their role in the encounter:  Patient, Daughter [name] Agapito Hanway, RMA, Dr.Prashanthi Veludandi.    Time spent on call:  8 minutes spent on the phone with Medical Assistant.

## 2023-04-04 NOTE — ED Notes (Signed)
Undressed pt Pt off to XRAY  Added LEFT ribs Pt complains of more pain to the ribs than hip Added rib XRAY  Will complete EKG when pt returned from imaging

## 2023-04-04 NOTE — ED Provider Notes (Signed)
Robyn [name] Provider Note   CSN: [id number] Arrival date & time: 04/04/23  1103     History  Chief Complaint  Patient presents with   Fall    04/01/23    Robyn [name] is a 88 y.o. female with a history including hypertension, osteoarthritis, type 2 diabetes, history of CVA, mild dementia, CAD, who lives at home with a daughter nearby presenting today for evaluation of a fall which occurred 3 days ago.  She was getting dressed in her bathroom when she tripped and fell landing with her left side against the toilet.  Since then she has pain in her left ribs and down her left side including her left lateral pelvis and hip.  She is able to weight-bear since the event.  She denies weakness or numbness in her legs, she denies dizziness, nausea or vomiting, head injury.  Daughter at bedside endorses she has had no changes in behavior since her fall.  She is stubborn and refuses to use her walker despite her PCPs attempt to convince her the importance of this.  She has had no treatment prior to arrival.  The history is provided by the patient.       Home Medications Prior to Admission medications   Medication Sig Start Date End Date Taking? Authorizing Provider  lidocaine (LIDODERM) 5 % Place 1 patch onto the skin daily. Remove & Discard patch within 12 hours or as directed by MD 04/04/23  Yes Vesta Wheeland, Raynelle Fanning, PA-C  ASPIRIN 81 PO Take 81 mg by mouth 2 (two) times a week.    [provider]  divalproex (DEPAKOTE) 125 MG DR tablet Take 1 tablet (125 mg total) by mouth 2 (two) times daily. 06/29/22   Ngetich, Dinah C, NP  glucose blood (ONETOUCH VERIO) test strip Use to test blood sugar three times daily. Dx: E11.40 01/30/23   Venita Sheffield, MD  insulin isophane & regular human KwikPen (NOVOLIN 70/30 KWIKPEN) (70-30) 100 UNIT/ML KwikPen Inject 25 units once daily subcutaneous In the afternoon. Dx: E11.40 01/30/23   Venita Sheffield,  MD  Insulin Pen Needle 31G X 6 MM MISC Use for insulin once daily. Dx: E11.40 01/30/23   Venita Sheffield, MD  Lancets [facility] ULTRASOFT) lancets Use to test blood sugar three time daily. Dx: E11.40 02/03/23   Venita Sheffield, MD  lisinopril (ZESTRIL) 10 MG tablet Take 1 tablet (10 mg total) by mouth daily. 06/29/22   Ngetich, Donalee Citrin, NP      Allergies    Patient has no known allergies.    Review of Systems   Review of Systems  Constitutional:  Negative for fever.  HENT:  Negative for congestion and sore throat.   Eyes: Negative.   Respiratory:  Negative for chest tightness and shortness of breath.   Cardiovascular:  Positive for chest pain.  Gastrointestinal:  Negative for abdominal pain, nausea and vomiting.  Genitourinary: Negative.   Musculoskeletal:  Positive for arthralgias. Negative for joint swelling and neck pain.  Skin: Negative.  Negative for rash and wound.  Neurological:  Negative for dizziness, weakness, light-headedness, numbness and headaches.  Psychiatric/Behavioral: Negative.    All other systems reviewed and are negative.   Physical Exam Updated Vital Signs BP (!) 181/65   Pulse (!) 59   Temp 97.9 F (36.6 C) (Oral)   Resp 15   Ht 5\' 1"  (1.549 m)   Wt 57.9 kg   SpO2 98%   BMI 24.12 kg/m  Physical Exam Vitals and nursing note reviewed.  Constitutional:      Appearance: She is well-developed.  HENT:     Head: Normocephalic and atraumatic.     Mouth/Throat:     Mouth: Mucous membranes are moist.     Pharynx: Oropharynx is clear.  Eyes:     Conjunctiva/sclera: Conjunctivae normal.  Cardiovascular:     Rate and Rhythm: Normal rate and regular rhythm.     Heart sounds: Normal heart sounds.  Pulmonary:     Effort: Pulmonary effort is normal.     Breath sounds: Normal breath sounds. No wheezing.  Chest:       Comments: Tender to palpation along left chest wall laterally anterior to mid axillary line.  No palpable deformities.  No bruising  or induration.  Tender along the left lateral pelvis bone.  She can flex and extend her ankles knees and hips with mild discomfort in the left hip.  Distal pedal pulses are intact. Abdominal:     General: Bowel sounds are normal.     Palpations: Abdomen is soft.     Tenderness: There is no abdominal tenderness.  Musculoskeletal:        General: Normal range of motion.     Cervical back: Normal range of motion.  Skin:    General: Skin is warm and dry.  Neurological:     Mental Status: She is alert.     ED Results / Procedures / Treatments   Labs (all labs ordered are listed, but only abnormal results are displayed) Labs Reviewed  CBC WITH DIFFERENTIAL/PLATELET  BASIC METABOLIC PANEL  URINALYSIS, ROUTINE W REFLEX MICROSCOPIC  TROPONIN I (HIGH SENSITIVITY)    EKG None  Radiology DG Ribs Unilateral W/Chest Left  Result Date: 04/04/2023 CLINICAL DATA:  Left rib pain and tenderness with movement after falling and hitting her left side and back on a commode. EXAM: LEFT RIBS AND CHEST - 3+ VIEW COMPARISON:  Portable chest dated 05/07/2022 FINDINGS: Very poor inspiration, less than on the previous examination with poor inspiration. Grossly stable borderline enlarged cardiac silhouette, magnified by the poor inspiration and AP technique. Clear lungs. Crowding of the pulmonary vasculature and interstitial markings. No rib fracture or pneumothorax seen. Atheromatous aortic calcifications. IMPRESSION: 1. Very poor inspiration. 2. No rib fracture or pneumothorax seen. Electronically Signed   By: Beckie Salts M.D.   On: 04/04/2023 15:57   DG Hip Unilat W or Wo Pelvis 2-3 Views Left  Result Date: 04/04/2023 CLINICAL DATA:  Left hip pain following a fall. EXAM: DG HIP (WITH OR WITHOUT PELVIS) 2-3V LEFT COMPARISON:  Pelvis and right hip dated 02/16/2023. FINDINGS: Normal-appearing left hip without fracture or dislocation. Previously noted right sacroiliac joint degenerative changes without  significant change. Stable lower lumbar spine degenerative changes. Extensive arterial calcifications without significant change. IMPRESSION: 1. No fracture. 2. Stable right sacroiliac joint degenerative changes. 3. Stable lower lumbar spine degenerative changes. Electronically Signed   By: Beckie Salts M.D.   On: 04/04/2023 15:54    Procedures Procedures    Medications Ordered in ED Medications  lidocaine (LIDODERM) 5 % 1 patch (has no administration in time range)  acetaminophen (TYLENOL) tablet 650 mg (650 mg Oral Given 04/04/23 1404)    ED Course/ Medical Decision Making/ A&P                                 Medical Decision Making Patient presenting  with trip and fall which occurred 3 days ago, with persistent pain in her left rib cage and her left lateral pelvis region.  She is ambulatory but endorses pain with weightbearing.  She has had no treatments prior to arrival.  She denies head injury, LOC, no nausea or vomiting, she is not on blood thinning medications.  No indication for CT head and imaging today.  She will be treated with Lidoderm patch to the rib cage, Tylenol, discussed role of ice and heat, we will plan follow-up with primary provider for recheck if her symptoms are not improving over the next week with this treatment plan.  Amount and/or Complexity of Data Reviewed Labs: ordered.    Details: General labs were obtained and reviewed, she has a normal CBC, normal B met, normal urinalysis and negative troponin. Radiology: ordered.    Details: X-rays with left rib detail negative for acute rib fractures.  Left hip also negative for fracture.  Risk OTC drugs. Prescription drug management.           Final Clinical Impression(s) / ED Diagnoses Final diagnoses:  Fall, initial encounter  Rib contusion, left, initial encounter  Pain of left hip    Rx / DC Orders ED Discharge Orders          Ordered    lidocaine (LIDODERM) 5 %  Every 24 hours        04/04/23  1607              Burgess Amor, PA-C 04/04/23 1609    Sloan Leiter, DO 04/05/23 [phone]

## 2023-04-04 NOTE — ED Triage Notes (Signed)
Pt presents with left hip pain, painful when ambulating, per daughter fell in bathroom while getting dress, hitting left side and back on commode, last fall approx 6 weeks ago.

## 2023-04-04 NOTE — ED Notes (Signed)
Provider at bedside

## 2023-04-04 NOTE — ED Notes (Signed)
Medicated per [facility] Pt requesting to eat Informed that we are still waiting on XRAy results Pain 7/10  Waiting on XRAY results

## 2023-04-04 NOTE — ED Notes (Signed)
Informed by family that RX is not covered by INS They requested something else sent over Informed PA

## 2023-04-04 NOTE — Progress Notes (Unsigned)
Careteam: Patient Care Team: Venita Sheffield, MD as PCP - General (Internal Medicine) Van Clines, MD as Consulting Physician (Neurology) Edwin Cap, DPM as Consulting Physician (Podiatry) Gwynneth Munson, Corrie Dandy (Neurology)  PLACE OF SERVICE:  [facility]  Advanced Directive information    No Known Allergies  Chief Complaint  Patient presents with   Acute Visit    Patient is having frequent falls.                                                VIDEO VIRTUAL VISIT                                               START TIME   9.20                                                 END TIME  9. 35   HPI: Patient is a 88 y.o. female was evaluated by video virtual visit for left hip pain  Daughter reports that pt had a fall on Saturday while patient was changing her clothes in the bathroom.  Daughter states that pt does not want to use the walker, she was evaluated by home physical therapy recently. Pt is not able to bear weight on her left side  complaining of pain even with minimal touch and does not want to see the doctor. Daughter  has been trying to convince her mom to get to doctors office since 3 days . Pt c/o mild sob Denies chest pain, dizziness    Review of Systems:  Review of Systems  Constitutional:  Negative for chills and fever.  HENT:  Negative for congestion and sore throat.   Eyes:  Negative for double vision.  Respiratory:  Negative for cough, sputum production and shortness of breath.   Cardiovascular:  Negative for chest pain, palpitations and leg swelling.  Gastrointestinal:  Negative for abdominal pain, heartburn and nausea.  Genitourinary:  Negative for dysuria, frequency and hematuria.  Musculoskeletal:  Positive for falls and joint pain. Negative for myalgias.  Neurological:  Negative for dizziness, sensory change and focal weakness.     Past Medical History:  Diagnosis Date   Coronary artery disease    Dementia (HCC)    Diabetes  mellitus    Glaucoma    Hallucination    Heart disease    History of heart artery stent 2009   Hypertension    Memory loss    just started on aricept   Mild cognitive impairment    Osteoarthritis    Phobia    Psychosis (HCC)    Stroke (HCC)    Vision abnormalities    Past Surgical History:  Procedure Laterality Date   CORONARY ANGIOPLASTY WITH STENT PLACEMENT     CORONARY STENT PLACEMENT     Social History:   reports that she quit smoking about 59 years ago. Her smoking use included cigarettes. She has never used smokeless tobacco. She reports that she does not drink alcohol and does not use drugs.  Family History  Problem Relation  Age of Onset   Hypertension Mother    Diabetes Mother    Hypertension Father    Diabetes Father    Hypertension Sister    Diabetes Sister    Kidney disease Sister    Liver disease Brother    Hypertension Daughter    Fibromyalgia Daughter    Rheum arthritis Daughter    Breast cancer Daughter    Hypertension Son    Seizures Granddaughter    Epilepsy Granddaughter     Medications: Patient's Medications  New Prescriptions   No medications on file  Previous Medications   ASPIRIN 81 PO    Take 81 mg by mouth 2 (two) times a week.   DIVALPROEX (DEPAKOTE) 125 MG DR TABLET    Take 1 tablet (125 mg total) by mouth 2 (two) times daily.   GLUCOSE BLOOD (ONETOUCH VERIO) TEST STRIP    Use to test blood sugar three times daily. Dx: E11.40   INSULIN ISOPHANE & REGULAR HUMAN KWIKPEN (NOVOLIN 70/30 KWIKPEN) (70-30) 100 UNIT/ML KWIKPEN    Inject 25 units once daily subcutaneous In the afternoon. Dx: E11.40   INSULIN PEN NEEDLE 31G X 6 MM MISC    Use for insulin once daily. Dx: E11.40   LANCETS (ONETOUCH ULTRASOFT) LANCETS    Use to test blood sugar three time daily. Dx: E11.40   LISINOPRIL (ZESTRIL) 10 MG TABLET    Take 1 tablet (10 mg total) by mouth daily.  Modified Medications   No medications on file  Discontinued Medications   No medications on  file    Physical Exam:  There were no vitals filed for this visit. There is no height or weight on file to calculate BMI. Wt Readings from Last 3 Encounters:  02/21/23 127 lb 9.6 oz (57.9 kg)  02/16/23 128 lb (58.1 kg)  01/11/23 127 lb (57.6 kg)       Labs reviewed: Basic Metabolic Panel: Recent Labs    05/06/22 1836 09/07/22 1507 02/16/23 1219  NA 138 141 136  K 3.8 4.1 3.6  CL 106 106 103  CO2 21* 25 24  GLUCOSE 98 101* 219*  BUN 20 20 14   CREATININE 1.07* 0.96* 0.79  CALCIUM 9.2 9.8 8.8*   Liver Function Tests: Recent Labs    05/06/22 1836  AST 24  ALT 14  ALKPHOS 71  BILITOT 0.4  PROT 7.5  ALBUMIN 3.9   No results for input(s): "LIPASE", "AMYLASE" in the last 8760 hours. No results for input(s): "AMMONIA" in the last 8760 hours. CBC: Recent Labs    05/06/22 1836 02/16/23 1219  WBC 6.7 6.2  NEUTROABS 3.9  --   HGB 14.1 12.6  HCT 42.4 39.0  MCV 88.9 92.4  PLT 260 234   Lipid Panel: No results for input(s): "CHOL", "HDL", "LDLCALC", "TRIG", "CHOLHDL", "LDLDIRECT" in the last 8760 hours. TSH: No results for input(s): "TSH" in the last 8760 hours. A1C: Lab Results  Component Value Date   HGBA1C 7.0 (H) 09/07/2022     Assessment/Plan  1. Unwitnessed fall  2. Left hip pain  Pt with unwitnessed fall at home Unable to bear weight and c/o pain with minimal touch as per family  Pt c/o mild SOB Informed patient and daughter that she needs to go to ED. Patient agreed with the plan   No follow-ups on file.:

## 2023-04-05 ENCOUNTER — Telehealth: Payer: Self-pay

## 2023-04-05 NOTE — Telephone Encounter (Signed)
Message left on clinical intake voicemail:   Requesting verbal orders to extend Home Health Physical Therapy 1 x a week for 2 week and reassess.   Call returned to Amy @ [phone], verbal order given per standing [facility] protocol.

## 2023-04-07 DIAGNOSIS — F0390 Unspecified dementia without behavioral disturbance: Secondary | ICD-10-CM | POA: Diagnosis not present

## 2023-04-07 DIAGNOSIS — Z87891 Personal history of nicotine dependence: Secondary | ICD-10-CM | POA: Diagnosis not present

## 2023-04-07 DIAGNOSIS — E11649 Type 2 diabetes mellitus with hypoglycemia without coma: Secondary | ICD-10-CM | POA: Diagnosis not present

## 2023-04-07 DIAGNOSIS — M159 Polyosteoarthritis, unspecified: Secondary | ICD-10-CM | POA: Diagnosis not present

## 2023-04-07 DIAGNOSIS — Z9181 History of falling: Secondary | ICD-10-CM | POA: Diagnosis not present

## 2023-04-07 DIAGNOSIS — Z794 Long term (current) use of insulin: Secondary | ICD-10-CM | POA: Diagnosis not present

## 2023-04-07 DIAGNOSIS — Z7982 Long term (current) use of aspirin: Secondary | ICD-10-CM | POA: Diagnosis not present

## 2023-04-07 DIAGNOSIS — I10 Essential (primary) hypertension: Secondary | ICD-10-CM | POA: Diagnosis not present

## 2023-04-07 DIAGNOSIS — E1165 Type 2 diabetes mellitus with hyperglycemia: Secondary | ICD-10-CM | POA: Diagnosis not present

## 2023-04-07 DIAGNOSIS — I2510 Atherosclerotic heart disease of native coronary artery without angina pectoris: Secondary | ICD-10-CM | POA: Diagnosis not present

## 2023-04-13 DIAGNOSIS — Z87891 Personal history of nicotine dependence: Secondary | ICD-10-CM | POA: Diagnosis not present

## 2023-04-13 DIAGNOSIS — I2510 Atherosclerotic heart disease of native coronary artery without angina pectoris: Secondary | ICD-10-CM | POA: Diagnosis not present

## 2023-04-13 DIAGNOSIS — Z9181 History of falling: Secondary | ICD-10-CM | POA: Diagnosis not present

## 2023-04-13 DIAGNOSIS — Z7982 Long term (current) use of aspirin: Secondary | ICD-10-CM | POA: Diagnosis not present

## 2023-04-13 DIAGNOSIS — F0390 Unspecified dementia without behavioral disturbance: Secondary | ICD-10-CM | POA: Diagnosis not present

## 2023-04-13 DIAGNOSIS — E11649 Type 2 diabetes mellitus with hypoglycemia without coma: Secondary | ICD-10-CM | POA: Diagnosis not present

## 2023-04-13 DIAGNOSIS — I10 Essential (primary) hypertension: Secondary | ICD-10-CM | POA: Diagnosis not present

## 2023-04-13 DIAGNOSIS — Z794 Long term (current) use of insulin: Secondary | ICD-10-CM | POA: Diagnosis not present

## 2023-04-13 DIAGNOSIS — M159 Polyosteoarthritis, unspecified: Secondary | ICD-10-CM | POA: Diagnosis not present

## 2023-04-13 DIAGNOSIS — E1165 Type 2 diabetes mellitus with hyperglycemia: Secondary | ICD-10-CM | POA: Diagnosis not present

## 2023-04-19 DIAGNOSIS — Z9181 History of falling: Secondary | ICD-10-CM | POA: Diagnosis not present

## 2023-04-19 DIAGNOSIS — E1165 Type 2 diabetes mellitus with hyperglycemia: Secondary | ICD-10-CM | POA: Diagnosis not present

## 2023-04-19 DIAGNOSIS — Z794 Long term (current) use of insulin: Secondary | ICD-10-CM | POA: Diagnosis not present

## 2023-04-19 DIAGNOSIS — I2510 Atherosclerotic heart disease of native coronary artery without angina pectoris: Secondary | ICD-10-CM | POA: Diagnosis not present

## 2023-04-19 DIAGNOSIS — F0390 Unspecified dementia without behavioral disturbance: Secondary | ICD-10-CM | POA: Diagnosis not present

## 2023-04-19 DIAGNOSIS — I10 Essential (primary) hypertension: Secondary | ICD-10-CM | POA: Diagnosis not present

## 2023-04-19 DIAGNOSIS — E11649 Type 2 diabetes mellitus with hypoglycemia without coma: Secondary | ICD-10-CM | POA: Diagnosis not present

## 2023-04-19 DIAGNOSIS — M159 Polyosteoarthritis, unspecified: Secondary | ICD-10-CM | POA: Diagnosis not present

## 2023-04-19 DIAGNOSIS — Z87891 Personal history of nicotine dependence: Secondary | ICD-10-CM | POA: Diagnosis not present

## 2023-04-19 DIAGNOSIS — Z7982 Long term (current) use of aspirin: Secondary | ICD-10-CM | POA: Diagnosis not present

## 2023-05-08 ENCOUNTER — Other Ambulatory Visit: Payer: Self-pay | Admitting: Sports Medicine

## 2023-05-09 ENCOUNTER — Ambulatory Visit (INDEPENDENT_AMBULATORY_CARE_PROVIDER_SITE_OTHER): Payer: Medicare HMO | Admitting: Sports Medicine

## 2023-05-09 ENCOUNTER — Encounter: Payer: Self-pay | Admitting: Sports Medicine

## 2023-05-09 VITALS — BP 130/82 | HR 68 | Temp 97.1°F | Resp 18 | Ht 61.0 in | Wt 131.4 lb

## 2023-05-09 DIAGNOSIS — I10 Essential (primary) hypertension: Secondary | ICD-10-CM

## 2023-05-09 DIAGNOSIS — R2689 Other abnormalities of gait and mobility: Secondary | ICD-10-CM | POA: Diagnosis not present

## 2023-05-09 DIAGNOSIS — Z23 Encounter for immunization: Secondary | ICD-10-CM | POA: Diagnosis not present

## 2023-05-09 DIAGNOSIS — Z794 Long term (current) use of insulin: Secondary | ICD-10-CM | POA: Diagnosis not present

## 2023-05-09 DIAGNOSIS — F02818 Dementia in other diseases classified elsewhere, unspecified severity, with other behavioral disturbance: Secondary | ICD-10-CM | POA: Diagnosis not present

## 2023-05-09 DIAGNOSIS — E1140 Type 2 diabetes mellitus with diabetic neuropathy, unspecified: Secondary | ICD-10-CM

## 2023-05-09 DIAGNOSIS — G309 Alzheimer's disease, unspecified: Secondary | ICD-10-CM

## 2023-05-09 MED ORDER — ATORVASTATIN CALCIUM 10 MG PO TABS: 10.0000 mg | ORAL_TABLET | Freq: Every day | ORAL | 3 refills | Status: DC

## 2023-05-09 MED ORDER — NOVOLIN 70/30 FLEXPEN (70-30) 100 UNIT/ML ~~LOC~~ SUPN: 22.0000 [IU] | PEN_INJECTOR | Freq: Every day | SUBCUTANEOUS | 1 refills | Status: DC

## 2023-05-09 NOTE — Addendum Note (Signed)
Addended by: Venita Sheffield on: 05/09/2023 03:26 PM   Modules accepted: Level of Service

## 2023-05-09 NOTE — Progress Notes (Signed)
Careteam: Patient Care Team: Venita Sheffield, MD as PCP - General (Internal Medicine) Van Clines, MD as Consulting Physician (Neurology) Edwin Cap, DPM as Consulting Physician (Podiatry) Gwynneth Munson, Corrie Dandy (Neurology)  PLACE OF SERVICE:  [facility]  Advanced Directive information    No Known Allergies  Chief Complaint  Patient presents with   Medical Management of Chronic Issues    4 month follow up.    Immunizations    Discuss the need for Covid Booster, Pne vaccine, Shingrix vaccine, and DTAP vaccine.    Health Maintenance    Discuss the need for Hemoglobin A1C, and Eye exam.      HPI: Patient is a 88 y.o. female  is here for follow up visit  Has no concerns today   Ambulates  with walker when she goes outside  Daughter states pt does not use walker Had a fall couple of months back  Completed physical therapy last month   DM  Lab Results  Component Value Date   HGBA1C 7.0 (H) 09/07/2022   HGBA1C 6.6 (H) 05/04/2022   HGBA1C 7.3 (H) 12/29/2021   On novolon 70/30 mix   25 units  once daily  Checks BG - fasting around 85-112 , evening around 130- 150 Hypoglycemia-  no Eye exam - has appt in April    HTN  Bp 124/ 78  On lisinopril     Latest Ref Rng & Units 04/04/2023   12:31 PM 02/16/2023   12:19 PM 09/07/2022    3:07 PM  BMP  Glucose 70 - 99 mg/dL 72  270  623   BUN 8 - 23 mg/dL 17  14  20    Creatinine 0.44 - 1.00 mg/dL 7.62  8.31  5.17   BUN/Creat Ratio 6 - 22 (calc)   21   Sodium 135 - 145 mmol/L 140  136  141   Potassium 3.5 - 5.1 mmol/L 4.2  3.6  4.1   Chloride 98 - 111 mmol/L 108  103  106   CO2 22 - 32 mmol/L 22  24  25    Calcium 8.9 - 10.3 mg/dL 9.0  8.8  9.8      Dementia  Pt lives alone , daughter visits her twice a day  Needs supervision with dressing ,bathing, hygiene Daughter helps with medication management  Hallucinations- since many yrs  Manageable as per daughter  Sleeps fine  On depakote      Review of  Systems:  Review of Systems  Constitutional:  Negative for chills and fever.  HENT:  Negative for congestion and sore throat.   Eyes:  Negative for double vision.  Respiratory:  Negative for cough, sputum production and shortness of breath.   Cardiovascular:  Negative for chest pain, palpitations and leg swelling.  Gastrointestinal:  Negative for abdominal pain, heartburn and nausea.  Genitourinary:  Negative for dysuria, frequency and hematuria.  Musculoskeletal:  Negative for falls and myalgias.  Neurological:  Negative for dizziness, sensory change and focal weakness.  Psychiatric/Behavioral:  Positive for hallucinations and memory loss.    Negative unless indicated in HPI.   Past Medical History:  Diagnosis Date   Coronary artery disease    Dementia (HCC)    Diabetes mellitus    Glaucoma    Hallucination    Heart disease    History of heart artery stent 2009   Hypertension    Memory loss    just started on aricept   Mild cognitive impairment  Osteoarthritis    Phobia    Psychosis (HCC)    Stroke (HCC)    Vision abnormalities    Past Surgical History:  Procedure Laterality Date   CORONARY ANGIOPLASTY WITH STENT PLACEMENT     CORONARY STENT PLACEMENT     Social History:   reports that she quit smoking about 59 years ago. Her smoking use included cigarettes. She has never used smokeless tobacco. She reports that she does not drink alcohol and does not use drugs.  Family History  Problem Relation Age of Onset   Hypertension Mother    Diabetes Mother    Hypertension Father    Diabetes Father    Hypertension Sister    Diabetes Sister    Kidney disease Sister    Liver disease Brother    Hypertension Daughter    Fibromyalgia Daughter    Rheum arthritis Daughter    Breast cancer Daughter    Hypertension Son    Seizures Granddaughter    Epilepsy Granddaughter     Medications: Patient's Medications  New Prescriptions   No medications on file  Previous  Medications   ASPIRIN 81 PO    Take 81 mg by mouth 2 (two) times a week.   DICLOFENAC SODIUM (VOLTAREN) 1 % GEL    Apply 2 g topically 4 (four) times daily.   DIVALPROEX (DEPAKOTE) 125 MG DR TABLET    Take 1 tablet (125 mg total) by mouth 2 (two) times daily.   GLUCOSE BLOOD (ONETOUCH VERIO) TEST STRIP    Use to test blood sugar three times daily. Dx: E11.40   INSULIN ISOPHANE & REGULAR HUMAN KWIKPEN (NOVOLIN 70/30 KWIKPEN) (70-30) 100 UNIT/ML KWIKPEN    INJECT 25 UNITS ONCE DAILY SUBCUTANEOUS IN THE AFTERNOON. DX: E11.40   INSULIN PEN NEEDLE 31G X 6 MM MISC    Use for insulin once daily. Dx: E11.40   LANCETS (ONETOUCH ULTRASOFT) LANCETS    Use to test blood sugar three time daily. Dx: E11.40   LISINOPRIL (ZESTRIL) 10 MG TABLET    Take 1 tablet (10 mg total) by mouth daily.  Modified Medications   No medications on file  Discontinued Medications   No medications on file    Physical Exam: There were no vitals filed for this visit. There is no height or weight on file to calculate BMI. BP Readings from Last 3 Encounters:  04/04/23 (!) 190/69  02/21/23 120/70  02/16/23 (!) 183/79   Wt Readings from Last 3 Encounters:  04/04/23 127 lb 10.3 oz (57.9 kg)  02/21/23 127 lb 9.6 oz (57.9 kg)  02/16/23 128 lb (58.1 kg)    Physical Exam Constitutional:      Appearance: Normal appearance.  HENT:     Head: Normocephalic and atraumatic.  Cardiovascular:     Rate and Rhythm: Normal rate and regular rhythm.  Pulmonary:     Effort: Pulmonary effort is normal. No respiratory distress.     Breath sounds: Normal breath sounds. No wheezing.  Abdominal:     General: Bowel sounds are normal. There is no distension.     Tenderness: There is no abdominal tenderness. There is no guarding or rebound.     Comments:    Musculoskeletal:        General: No swelling or tenderness.  Neurological:     Mental Status: She is alert. Mental status is at baseline.     Sensory: No sensory deficit.     Motor:  No weakness.  Comments: Finger nose negative Strength and sensations intact Rombergs negative Cranial nerve intact      Labs reviewed: Basic Metabolic Panel: Recent Labs    09/07/22 1507 02/16/23 1219 04/04/23 1231  NA 141 136 140  K 4.1 3.6 4.2  CL 106 103 108  CO2 25 24 22   GLUCOSE 101* 219* 72  BUN 20 14 17   CREATININE 0.96* 0.79 0.88  CALCIUM 9.8 8.8* 9.0   Liver Function Tests: No results for input(s): "AST", "ALT", "ALKPHOS", "BILITOT", "PROT", "ALBUMIN" in the last 8760 hours. No results for input(s): "LIPASE", "AMYLASE" in the last 8760 hours. No results for input(s): "AMMONIA" in the last 8760 hours. CBC: Recent Labs    02/16/23 1219 04/04/23 1231  WBC 6.2 6.5  NEUTROABS  --  4.1  HGB 12.6 12.4  HCT 39.0 40.1  MCV 92.4 95.2  PLT 234 209   Lipid Panel: No results for input(s): "CHOL", "HDL", "LDLCALC", "TRIG", "CHOLHDL", "LDLDIRECT" in the last 8760 hours. TSH: No results for input(s): "TSH" in the last 8760 hours. A1C: Lab Results  Component Value Date   HGBA1C 7.0 (H) 09/07/2022     Assessment/Plan 1. Type 2 diabetes mellitus with diabetic neuropathy, with long-term current use of insulin (HCC) (Primary) Will decrease  novolon to 22 units  from 25 units daily Monitor for hypoglycemia - Hemoglobin A1C - Ambulatory referral to Ophthalmology - Lipid Panel - atorvastatin (LIPITOR) 10 MG tablet; Take 1 tablet (10 mg total) by mouth daily.  Dispense: 90 tablet; Refill: 3 - insulin isophane & regular human KwikPen (NOVOLIN 70/30 KWIKPEN) (70-30) 100 UNIT/ML KwikPen; Inject 22 Units into the skin daily.  DX: E11.40  Dispense: 15 mL; Refill: 1  2. Major Neurocognitive disorder with behavior disturbance (HCC) Cont with depakote Cont with supportive care  3. Balance problems Instructed patient to use walker at all times  4. Essential hypertension  130/82  At goal  Cont with the same  No follow-ups on file.: 3 months

## 2023-05-10 LAB — LIPID PANEL
Cholesterol: 235 mg/dL — ABNORMAL HIGH (ref <200)
HDL: 75 mg/dL (ref >=50)
LDL Cholesterol (Calc): 134 mg/dL — ABNORMAL HIGH
Non-HDL Cholesterol (Calc): 160 mg/dL — ABNORMAL HIGH (ref <130)
Total CHOL/HDL Ratio: 3.1 (calc) (ref <5.0)
Triglycerides: 135 mg/dL (ref <150)

## 2023-05-10 LAB — HEMOGLOBIN A1C
Hgb A1c MFr Bld: 6.4 %{Hb} — ABNORMAL HIGH (ref <5.7)
Mean Plasma Glucose: 137 mg/dL
eAG (mmol/L): 7.6 mmol/L

## 2023-05-11 ENCOUNTER — Other Ambulatory Visit: Payer: Self-pay

## 2023-05-11 DIAGNOSIS — E1140 Type 2 diabetes mellitus with diabetic neuropathy, unspecified: Secondary | ICD-10-CM

## 2023-05-12 ENCOUNTER — Ambulatory Visit (INDEPENDENT_AMBULATORY_CARE_PROVIDER_SITE_OTHER): Payer: Medicare HMO | Admitting: Physician Assistant

## 2023-05-12 ENCOUNTER — Encounter: Payer: Self-pay | Admitting: Physician Assistant

## 2023-05-12 VITALS — BP 174/77 | Resp 20 | Ht 61.0 in | Wt 129.0 lb

## 2023-05-12 DIAGNOSIS — F02818 Dementia in other diseases classified elsewhere, unspecified severity, with other behavioral disturbance: Secondary | ICD-10-CM | POA: Diagnosis not present

## 2023-05-12 DIAGNOSIS — G3183 Dementia with Lewy bodies: Secondary | ICD-10-CM | POA: Diagnosis not present

## 2023-05-12 NOTE — Patient Instructions (Signed)
It was a pleasure to see you today at our office.   Recommendations:  Follow up in  6 months Recommend increasing Depakote to 250 mg two times a day  if needed      Whom to call:  Memory  decline, memory medications: Call out office [phone]   For psychiatric meds, mood meds: Please have your primary care physician manage these medications.      For assessment of decision of mental capacity and competency:  Call Dr. Erick Blinks, geriatric psychiatrist at [phone]  For guidance in geriatric dementia issues please call [facility] [phone]   If you have any severe symptoms of a stroke, or other severe issues such as confusion,severe chills or fever, etc call 911 or go to the ER as you may need to be evaluate further   Feel free to visit Facebook page " [email or web address]" for tips of how to care for people with memory problems.      RECOMMENDATIONS FOR ALL PATIENTS WITH MEMORY PROBLEMS: 1. Continue to exercise (Recommend 30 minutes of walking everyday, or 3 hours every week) 2. Increase social interactions - continue going to [place] and enjoy social gatherings with friends and family 3. Eat healthy, avoid fried foods and eat more fruits and vegetables 4. Maintain adequate blood pressure, blood sugar, and blood cholesterol level. Reducing the risk of stroke and cardiovascular disease also helps promoting better memory. 5. Avoid stressful situations. Live a simple life and avoid aggravations. Organize your time and prepare for the next day in anticipation. 6. Sleep well, avoid any interruptions of sleep and avoid any distractions in the bedroom that may interfere with adequate sleep quality 7. Avoid sugar, avoid sweets as there is a strong link between excessive sugar intake, diabetes, and cognitive impairment We discussed the Mediterranean diet, which has been shown to help patients reduce the risk of progressive memory disorders and reduces cardiovascular risk. This  includes eating fish, eat fruits and green leafy vegetables, nuts like almonds and hazelnuts, walnuts, and also use olive oil. Avoid fast foods and fried foods as much as possible. Avoid sweets and sugar as sugar use has been linked to worsening of memory function.  There is always a concern of gradual progression of memory problems. If this is the case, then we may need to adjust level of care according to patient needs. Support, both to the patient and caregiver, should then be put into place.    The Alzheimer's Association is here all day, every day for people facing Alzheimer's disease through our free 24/7 Helpline: ([phone]. The Helpline provides reliable information and support to all those who need assistance, such as individuals living with memory loss, Alzheimer's or other dementia, caregivers, health care professionals and the public.  Our highly trained and knowledgeable staff can help you with: Understanding memory loss, dementia and Alzheimer's  Medications and other treatment options  General information about aging and brain health  Skills to provide quality care and to find the best care from professionals  Legal, financial and living-arrangement decisions Our Helpline also features: Confidential care consultation provided by master's level clinicians who can help with decision-making support, crisis assistance and education on issues families face every day  Help in a caller's preferred language using our translation service that features more than 200 languages and dialects  Referrals to local community programs, services and ongoing support     FALL PRECAUTIONS: Be cautious when walking. Scan the area for obstacles that may increase the  risk of trips and falls. When getting up in the mornings, sit up at the edge of the bed for a few minutes before getting out of bed. Consider elevating the bed at the head end to avoid drop of blood pressure when getting up. Walk always in  a well-lit room (use night lights in the walls). Avoid area rugs or power cords from appliances in the middle of the walkways. Use a walker or a cane if necessary and consider physical therapy for balance exercise. Get your eyesight checked regularly.  FINANCIAL OVERSIGHT: Supervision, especially oversight when making financial decisions or transactions is also recommended.  HOME SAFETY: Consider the safety of the kitchen when operating appliances like stoves, microwave oven, and blender. Consider having supervision and share cooking responsibilities until no longer able to participate in those. Accidents with firearms and other hazards in the house should be identified and addressed as well.   ABILITY TO BE LEFT ALONE: If patient is unable to contact 911 operator, consider using LifeLine, or when the need is there, arrange for someone to stay with patients. Smoking is a fire hazard, consider supervision or cessation. Risk of wandering should be assessed by caregiver and if detected at any point, supervision and safe proof recommendations should be instituted.  MEDICATION SUPERVISION: Inability to self-administer medication needs to be constantly addressed. Implement a mechanism to ensure safe administration of the medications.   DRIVING: Regarding driving, in patients with progressive memory problems, driving will be impaired. We advise to have someone else do the driving if trouble finding directions or if minor accidents are reported. Independent driving assessment is available to determine safety of driving.   If you are interested in the driving assessment, you can contact the following:  [employer] in [place] [phone]  [facility] [phone]  [facility] [phone] or [phone]      Mediterranean Diet A Mediterranean diet refers to food and lifestyle choices that are based on the traditions of  countries located on the [employer]. This way of eating has been shown to help prevent certain conditions and improve outcomes for people who have chronic diseases, like kidney disease and heart disease. What are tips for following this plan? Lifestyle  Cook and eat meals together with your family, when possible. Drink enough fluid to keep your urine clear or pale yellow. Be physically active every day. This includes: Aerobic exercise like running or swimming. Leisure activities like gardening, walking, or housework. Get 7-8 hours of sleep each night. If recommended by your health care provider, drink red wine in moderation. This means 1 glass a day for nonpregnant women and 2 glasses a day for men. A glass of wine equals 5 oz (150 mL). Reading food labels  Check the serving size of packaged foods. For foods such as rice and pasta, the serving size refers to the amount of cooked product, not dry. Check the total fat in packaged foods. Avoid foods that have saturated fat or trans fats. Check the ingredients list for added sugars, such as corn syrup. Shopping  At the grocery store, buy most of your food from the areas near the walls of the store. This includes: Fresh fruits and vegetables (produce). Grains, beans, nuts, and seeds. Some of these may be available in unpackaged forms or large amounts (in bulk). Fresh seafood. Poultry and eggs. Low-fat dairy products. Buy whole ingredients instead of prepackaged foods. Buy fresh fruits and vegetables in-season from local  farmers markets. Buy frozen fruits and vegetables in resealable bags. If you do not have access to quality fresh seafood, buy precooked frozen shrimp or canned fish, such as tuna, salmon, or sardines. Buy small amounts of raw or cooked vegetables, salads, or olives from the deli or salad bar at your store. Stock your pantry so you always have certain foods on hand, such as olive oil, canned tuna, canned tomatoes, rice,  pasta, and beans. Cooking  Cook foods with extra-virgin olive oil instead of using butter or other vegetable oils. Have meat as a side dish, and have vegetables or grains as your main dish. This means having meat in small portions or adding small amounts of meat to foods like pasta or stew. Use beans or vegetables instead of meat in common dishes like chili or lasagna. Experiment with different cooking methods. Try roasting or broiling vegetables instead of steaming or sauteing them. Add frozen vegetables to soups, stews, pasta, or rice. Add nuts or seeds for added healthy fat at each meal. You can add these to yogurt, salads, or vegetable dishes. Marinate fish or vegetables using olive oil, lemon juice, garlic, and fresh herbs. Meal planning  Plan to eat 1 vegetarian meal one day each week. Try to work up to 2 vegetarian meals, if possible. Eat seafood 2 or more times a week. Have healthy snacks readily available, such as: Vegetable sticks with hummus. Greek yogurt. Fruit and nut trail mix. Eat balanced meals throughout the week. This includes: Fruit: 2-3 servings a day Vegetables: 4-5 servings a day Low-fat dairy: 2 servings a day Fish, poultry, or lean meat: 1 serving a day Beans and legumes: 2 or more servings a week Nuts and seeds: 1-2 servings a day Whole grains: 6-8 servings a day Extra-virgin olive oil: 3-4 servings a day Limit red meat and sweets to only a few servings a month What are my food choices? Mediterranean diet Recommended Grains: Whole-grain pasta. Brown rice. Bulgar wheat. Polenta. Couscous. Whole-wheat bread. Orpah Cobb. Vegetables: Artichokes. Beets. Broccoli. Cabbage. Carrots. Eggplant. Green beans. Chard. Kale. Spinach. Onions. Leeks. Peas. Squash. Tomatoes. Peppers. Radishes. Fruits: Apples. Apricots. Avocado. Berries. Bananas. Cherries. Dates. Figs. Grapes. Lemons. Melon. Oranges. Peaches. Plums. Pomegranate. Meats and other protein foods: Beans.  Almonds. Sunflower seeds. Pine nuts. Peanuts. Cod. Salmon. Scallops. Shrimp. Tuna. Tilapia. Clams. Oysters. Eggs. Dairy: Low-fat milk. Cheese. Greek yogurt. Beverages: Water. Red wine. Herbal tea. Fats and oils: Extra virgin olive oil. Avocado oil. Grape seed oil. Sweets and desserts: [place] yogurt with honey. Baked apples. Poached pears. Trail mix. Seasoning and other foods: Basil. Cilantro. Coriander. Cumin. Mint. Parsley. Sage. Rosemary. Tarragon. Garlic. Oregano. Thyme. Pepper. Balsalmic vinegar. Tahini. Hummus. Tomato sauce. Olives. Mushrooms. Limit these Grains: Prepackaged pasta or rice dishes. Prepackaged cereal with added sugar. Vegetables: Deep fried potatoes (french fries). Fruits: Fruit canned in syrup. Meats and other protein foods: Beef. Pork. Lamb. Poultry with skin. Hot dogs. Tomasa Blase. Dairy: Ice cream. Sour cream. Whole milk. Beverages: Juice. Sugar-sweetened soft drinks. Beer. Liquor and spirits. Fats and oils: Butter. Canola oil. Vegetable oil. Beef fat (tallow). Lard. Sweets and desserts: Cookies. Cakes. Pies. Candy. Seasoning and other foods: Mayonnaise. Premade sauces and marinades. The items listed may not be a complete list. Talk with your dietitian about what dietary choices are right for you. Summary The Mediterranean diet includes both food and lifestyle choices. Eat a variety of fresh fruits and vegetables, beans, nuts, seeds, and whole grains. Limit the amount of red meat and sweets that you  eat. Talk with your health care provider about whether it is safe for you to drink red wine in moderation. This means 1 glass a day for nonpregnant women and 2 glasses a day for men. A glass of wine equals 5 oz (150 mL). This information is not intended to replace advice given to you by your health care provider. Make sure you discuss any questions you have with your health care provider. Document Released: 12/31/2015 Document Revised: 02/02/2016 Document Reviewed:  12/31/2015 Elsevier Interactive Patient Education  2017 [employer].

## 2023-05-12 NOTE — Progress Notes (Signed)
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 RH female with a history of Lewy Body Disease seen today in follow up for memory loss. Patient is not on antidementia medication as per daughter's request given the risk outweighing the benefits.  She is on Depakote 125 mg twice daily for hallucinations and other behavioral disturbance.Memory is stable, MMSE 24/30.  She continues to have hallucinations and mood is not well controlled. Discussed increasing the dose to 250 mg bid, she will talk to her PCP.  She is able to participate on her ADLs to her ability with more surveillance than prior.    Follow up in   months. Recommend good control of her cardiovascular risk factors Continue to control mood with Depakote 125mg  twice daily, may consider increasing to 250 bid for better control. side effects discussed. Close monitoring for safety is recommended     Subjective:    This patient is accompanied in the office by her daughter  who supplements the history.  Previous records as well as any outside records available were reviewed prior to todays visit. Patient was last seen on 10/20/2022 with MMSE 27/30.   Any changes in memory since last visit? " About the same".  Her daughter reports that she does not think that she has memory issues.  She is able to remember conversations and names.  Sometimes she feels like the TV personalities are not real. repeats oneself?  Endorsed Disoriented when walking into a room?  Patient denies    Leaving objects?  May misplace things but not in unusual places but collects stuff not as much as before   Wandering behavior?  She opens and closes the doors but does not wander off.  She has locked the doors. Any personality changes since last visit?  denies   Any worsening depression?:  Denies.   Hallucinations or paranoia?  She talks with "they or them ".  Sometimes she  feels other people outside want to come into the house. Sometimes she sees snakes and water entering the room.  Seizures? denies    Any sleep changes?  Sleeps well, she does have vivid dreams, denies REM behavior or sleepwalking   Sleep apnea?   Denies.   Any hygiene concerns? Declining, she does not want her daughter to help her. "It is a challenge to get her to let me help her, she does not want any help".  Independent of bathing and dressing?  Endorsed  Does the patient needs help with medications?  Daughter is in charge, especially the insulin   Who is in charge of the finances?  Daughter is in charge   Any changes in appetite?  denies     Patient have trouble swallowing? Denies.   Does the patient cook? No Any headaches?   denies   Chronic back pain  denies   Ambulates with difficulty? Denies.  She refuses to use a cane for stability when she is not with her daughter, which resulted in 2 different falls one  in September 2024 hitting her head without LOC and negative workup and another one  on November 2024, both mechanical . Recent falls or head injuries? denies     Unilateral weakness, numbness or tingling? denies   Any tremors?  Denies   Any anosmia?  Denies   Any incontinence of urine?  She has a history of stress incontinence but does not like  to use diapers Any bowel dysfunction?   Denies      Patient lives alone in [place] , near her daughter who monitors her. Daughter decided against a facility.   Does the patient drive? No longer drives      HISTORY OF PRESENT ILLNESS 06/05/20: This is an 88 year old right-handed woman with a history of hypertension, hyperlipidemia, diabetes, dementia, presenting for evaluation of dementia. On her PCP appointment in October 2021, daughter reported she is often disoriented to place and time, and hears voices telling her what to do. She lives alone and her daughters check on her multiple times per week and help with housework. She was evaluated by  neurologist Dr. Epimenio Foot for similar symptoms in 2016. Symptoms had started around 2014. At that time, hallucinations were primarily auditory but sometimes visual. She also reported sleep difficulty with the voices speaking to her when she tries to fall asleep. She had side effects of nausea on Donepezil and was prescribed Memantine and Seroquel. MOCA 26/30 in 11/2014. Etiology of symptoms at that time unclear, she did not have features of LBD or AD. She was started on hydroxyzine then was lost to follow-up.   Over the years, Robyn [name] reports that the auditory hallucinations have worsened. The voices would tell her to go outside sometimes, as far as [name] knows she has not wandered outside. She reports heaviness in her truncal region, that she has gained weight, Robyn [name] states she thinks the people are causing the heaviness behind her. The voices tell her that her laundry is not worth keeping. When they are walking, she wants to go a different way because the voices are telling her to go that way. She would offer food when she is eating. She goes to the bathroom and would be heard saying leave her alone. She would stand and stare, and when asked what she is doing, she says she is talking to them. Initially she stated she does not hear anything, then Robyn [name] reminds her it occurs all the time, she talks to them and waves while she is at [employer]. She states they are in a group, in her house, and can be scary for her, keeping her up at night. She only gets 1-2 hours of sleep at night and naps in the day. She recalls Seroquel (and maybe hydroxyzine) causing dizziness in the past. She says she took these for a long time, but was lost to follow-up.    Robyn [name] thinks her memory is okay. She continues to live alone. She states her memory is "sometimes not very well." She does not drive. She continues to manage her own medications and finances, they deny any issues. She denies leaving the stove on. She has occasional word-finding  difficulties. There is no clear REM behavior disorder. She has occasional hand tremors. She always feel like her balance is off, no falls. She denies any headaches, diplopia, dysarthria/dysphagia, neck pain, focal numbness/tingling/weakness, bowel/bladder dysfunction, anosmia. There is no family history of dementia, no history of significant head injuries or alcohol use.      Laboratory Data: EKG in 06/2019 showed a QTc of 460   I personally reviewed head CTs done over the years, most recently in 06/2019 which showed moderate diffuse atrophy, there appears to be more in the right frontal and occipital regions.   Neurocognitive testing 10/28/2020 Dr. Roseanne Reno Ms. Hausner demonstrated impaired performance on measures of processing speed, executive function, and her visuospatial and constructional functioning fell at an unusually low level, suggesting  some mild difficulties. She also had marked naming problems and diminished phonemic as compared to semantic fluency. Memory performance was notable for low encoding of information but she retained verbal information well and does not appear to have a frank storage problem at this time. She did well on measures of attention/working memory. Her CDR places her in the mild dementia range based on the history of provided and her objective test performance, although her daughter characterized her as functioning at a moderate dementia level. It is possible that movement or other issues are confounding her rating or perhaps the patient simply tests well. She did score a 4/4 on the Mayo Fluctuations Questionnaire.   Ms. Fiscal is thus demonstrating a dementia level problem with primary deficits on measures of processing speed, executive functioning, and naming and she has some additional visuospatial/constructional issues. She meets criteria for probable LBD (formed visual hallucinations, fluctuations, Parkinsonism, cognitive impairment) and that is the likely cause of her  difficulties. Her test data are weakly supportive although her visuospatial functioning is less affected than might be expected. A mixed pathological picture is also possible, although her test data are equivocal for AD (has naming problems but no memory storage problem  PREVIOUS MEDICATIONS:   CURRENT MEDICATIONS:  Outpatient Encounter Medications as of 05/12/2023  Medication Sig   ASPIRIN 81 PO Take 81 mg by mouth 2 (two) times a week.   atorvastatin (LIPITOR) 10 MG tablet Take 1 tablet (10 mg total) by mouth daily.   diclofenac Sodium (VOLTAREN) 1 % GEL Apply 2 g topically 4 (four) times daily.   divalproex (DEPAKOTE) 125 MG DR tablet Take 1 tablet (125 mg total) by mouth 2 (two) times daily.   glucose blood (ONETOUCH VERIO) test strip Use to test blood sugar three times daily. Dx: E11.40   insulin isophane & regular human KwikPen (NOVOLIN 70/30 KWIKPEN) (70-30) 100 UNIT/ML KwikPen Inject 20 Units into the skin daily. If fasting or evening blood sugar is 85 or less, reduce units by 3. DX: E11.40   Insulin Pen Needle 31G X 6 MM MISC Use for insulin once daily. Dx: E11.40   Lancets (ONETOUCH ULTRASOFT) lancets Use to test blood sugar three time daily. Dx: E11.40   lisinopril (ZESTRIL) 10 MG tablet Take 1 tablet (10 mg total) by mouth daily.   No facility-administered encounter medications on file as of 05/12/2023.       05/12/2023    5:00 PM 10/20/2022    3:00 PM 04/08/2022    1:00 PM  MMSE - Mini Mental State Exam  Orientation to time 3 4 5   Orientation to Place 2 5 5   Registration 3 3 3   Attention/ Calculation 5 5 5   Recall 3 1 3   Language- name 2 objects 2 2 2   Language- repeat 1 1 1   Language- follow 3 step command 3 3 3   Language- read & follow direction 1 1 1   Write a sentence 1 1 1   Copy design 0 1 1  Total score 24 27 30       12/02/2014   10:00 AM  Montreal Cognitive Assessment   Visuospatial/ Executive (0/5) 3  Naming (0/3) 3  Attention: Read list of digits (0/2)  2  Attention: Read list of letters (0/1) 1  Attention: Serial 7 subtraction starting at 100 (0/3) 3  Language: Repeat phrase (0/2) 2  Language : Fluency (0/1) 0  Abstraction (0/2) 2  Delayed Recall (0/5) 3  Orientation (0/6) 6  Total 25  Adjusted  Score (based on education) 25    Objective:     PHYSICAL EXAMINATION:    VITALS:   Vitals:   05/12/23 1515 05/12/23 1554  BP: (!) 184/81 (!) 174/77  Resp: 20   SpO2: 98%   Weight: 129 lb (58.5 kg)   Height: 5\' 1"  (1.549 m)     GEN:  The patient appears stated age and is in NAD. HEENT:  Normocephalic, atraumatic.   Neurological examination:  General: NAD, well-groomed, appears stated age. Orientation: The patient is alert. Oriented to person, not to place and date Cranial nerves: There is good facial symmetry.The speech is fluent and clear. No aphasia or dysarthria. Fund of knowledge is appropriate. Recent and remote memory are impaired. Attention and concentration are reduced.  Able to name objects and repeat phrases.  Hearing is intact to conversational tone.   Sensation: Sensation is intact to light touch throughout Motor: Strength is at least antigravity x4. DTR's 2/4 in UE/LE     Movement examination: Tone: There is normal tone in the UE/LE Abnormal movements:  no tremor.  No myoclonus.  No asterixis.   Coordination:  There is no decremation with RAM's. Normal finger to nose  Gait and Station: The patient has no  difficulty arising out of a deep-seated chair without the use of the hands. The patient's stride length is short, Gait is cautious and narrow.    Thank you for allowing [place] the opportunity to participate in the care of this nice patient. Please do not hesitate to contact us for any questions or concerns.   Total time spent on today's visit was 30 minutes dedicated to this patient today, preparing to see patient, examining the patient, ordering tests and/or medications and counseling the patient, documenting  clinical information in the EHR or other health record, independently interpreting results and communicating results to the patient/family, discussing treatment and goals, answering patient's questions and coordinating care.  Cc:  Venita Sheffield, MD  Marlowe Kays 05/12/2023 5:59 PM

## 2023-06-08 ENCOUNTER — Telehealth: Payer: Self-pay

## 2023-06-08 ENCOUNTER — Ambulatory Visit: Payer: Medicare HMO | Admitting: Podiatry

## 2023-06-08 NOTE — Telephone Encounter (Signed)
Patient has now switched her insurance to [employer] care and insulin isophane & regular human KwikPen (NOVOLIN 70/30 KWIKPEN) (70-30) 100 UNIT/ML KwikPen is not covered but states that Humalog is covered for patient.

## 2023-06-08 NOTE — Telephone Encounter (Signed)
Patient states she will call pharmacies to see who accepts the insurance and same medication and give [place] a call back

## 2023-06-12 ENCOUNTER — Ambulatory Visit: Payer: Medicare HMO | Admitting: Nurse Practitioner

## 2023-06-23 ENCOUNTER — Other Ambulatory Visit: Payer: Self-pay | Admitting: Family

## 2023-06-23 DIAGNOSIS — I10 Essential (primary) hypertension: Secondary | ICD-10-CM

## 2023-06-27 ENCOUNTER — Ambulatory Visit: Payer: Medicaid Other | Admitting: Sports Medicine

## 2023-07-26 ENCOUNTER — Ambulatory Visit: Payer: 59 | Admitting: Nurse Practitioner

## 2023-08-08 ENCOUNTER — Ambulatory Visit: Payer: Medicare HMO | Admitting: Sports Medicine

## 2023-08-15 ENCOUNTER — Ambulatory Visit (INDEPENDENT_AMBULATORY_CARE_PROVIDER_SITE_OTHER): Admitting: Nurse Practitioner

## 2023-08-15 VITALS — BP 188/82 | HR 63 | Temp 97.2°F | Ht 61.0 in | Wt 130.0 lb

## 2023-08-15 DIAGNOSIS — E782 Mixed hyperlipidemia: Secondary | ICD-10-CM | POA: Insufficient documentation

## 2023-08-15 DIAGNOSIS — G3183 Dementia with Lewy bodies: Secondary | ICD-10-CM

## 2023-08-15 DIAGNOSIS — Z794 Long term (current) use of insulin: Secondary | ICD-10-CM

## 2023-08-15 DIAGNOSIS — L97522 Non-pressure chronic ulcer of other part of left foot with fat layer exposed: Secondary | ICD-10-CM | POA: Insufficient documentation

## 2023-08-15 DIAGNOSIS — I10 Essential (primary) hypertension: Secondary | ICD-10-CM | POA: Diagnosis not present

## 2023-08-15 DIAGNOSIS — Z0001 Encounter for general adult medical examination with abnormal findings: Secondary | ICD-10-CM | POA: Diagnosis not present

## 2023-08-15 DIAGNOSIS — Z23 Encounter for immunization: Secondary | ICD-10-CM

## 2023-08-15 DIAGNOSIS — F02818 Dementia in other diseases classified elsewhere, unspecified severity, with other behavioral disturbance: Secondary | ICD-10-CM

## 2023-08-15 DIAGNOSIS — E1140 Type 2 diabetes mellitus with diabetic neuropathy, unspecified: Secondary | ICD-10-CM

## 2023-08-15 MED ORDER — LISINOPRIL-HYDROCHLOROTHIAZIDE 10-12.5 MG PO TABS: 1.0000 | ORAL_TABLET | Freq: Every day | ORAL | 0 refills | Status: DC

## 2023-08-15 NOTE — Progress Notes (Unsigned)
 New Patient Office Visit  Subjective   Patient ID: Robyn [name], female    DOB: 08/09/1934  Age: 89 y.o. MRN: [id number]  CC:  Chief Complaint  Patient presents with   Establish Care    HPI Robyn [name] presents with her daughter to establish care ***  Outpatient Encounter Medications as of 08/15/2023  Medication Sig   ASPIRIN 81 PO Take 81 mg by mouth 2 (two) times a week.   atorvastatin (LIPITOR) 10 MG tablet Take 1 tablet (10 mg total) by mouth daily.   divalproex (DEPAKOTE) 125 MG DR tablet Take 1 tablet (125 mg total) by mouth 2 (two) times daily.   glucose blood (ONETOUCH VERIO) test strip Use to test blood sugar three times daily. Dx: E11.40   insulin isophane & regular human KwikPen (NOVOLIN 70/30 KWIKPEN) (70-30) 100 UNIT/ML KwikPen Inject 20 Units into the skin daily. If fasting or evening blood sugar is 85 or less, reduce units by 3. DX: E11.40   Insulin Pen Needle 31G X 6 MM MISC Use for insulin once daily. Dx: E11.40   Lancets (ONETOUCH ULTRASOFT) lancets Use to test blood sugar three time daily. Dx: E11.40   lisinopril-hydrochlorothiazide (ZESTORETIC) 10-12.5 MG tablet Take 1 tablet by mouth daily.   [DISCONTINUED] lisinopril (ZESTRIL) 10 MG tablet Take 1 tablet by mouth once daily   diclofenac Sodium (VOLTAREN) 1 % GEL Apply 2 g topically 4 (four) times daily. (Patient not taking: Reported on 08/15/2023)   No facility-administered encounter medications on file as of 08/15/2023.    Past Medical History:  Diagnosis Date   Coronary artery disease    Dementia (HCC)    Diabetes mellitus    Glaucoma    Hallucination    Heart disease    History of heart artery stent 2009   Hypertension    Memory loss    just started on aricept   Mild cognitive impairment    Osteoarthritis    Phobia    Psychosis (HCC)    Stroke (HCC)    Vision abnormalities     Past Surgical History:  Procedure Laterality Date   CORONARY ANGIOPLASTY WITH STENT PLACEMENT      CORONARY STENT PLACEMENT      Family History  Problem Relation Age of Onset   Hypertension Mother    Diabetes Mother    Hypertension Father    Diabetes Father    Hypertension Sister    Diabetes Sister    Kidney disease Sister    Liver disease Brother    Hypertension Daughter    Fibromyalgia Daughter    Rheum arthritis Daughter    Breast cancer Daughter    Hypertension Son    Seizures Granddaughter    Epilepsy Granddaughter     Social History   Socioeconomic History   Marital status: Widowed    Spouse name: Not on file   Number of children: Not on file   Years of education: 10   Highest education level: 10th grade  Occupational History   Occupation: retired  Tobacco Use   Smoking status: Former    Current packs/day: 0.00    Types: Cigarettes    Quit date: 10/08/1963    Years since quitting: 59.8   Smokeless tobacco: Never  Vaping Use   Vaping status: Never Used  Substance and Sexual Activity   Alcohol use: No   Drug use: No   Sexual activity: Not on file  Other Topics Concern   Not on file  Social History Narrative   Diet:      Caffeine:      Married, if yes what year:       Do you live in a house, apartment, assisted living, condo, trailer, ect: Apartment      Is it one or more stories: one      How many persons live in your home? Self      Pets: No      Highest level or education completed: 10 th      Current/Past profession: Museum/gallery curator      Exercise:                  Type and how often:          Living Will: No   DNR: No   POA/HPOA: No      Functional Status:   Do you have difficulty bathing or dressing yourself? No   Do you have difficulty preparing food or eating?   Do you have difficulty managing your medications?   Do you have difficulty managing your finances?   Do you have difficulty affording your medications? No      Right handed       Social Drivers of Health   Financial Resource Strain: Low Risk  (08/15/2023)    Overall Financial Resource Strain (CARDIA)    Difficulty of Paying Living Expenses: Not hard at all  Food Insecurity: No Food Insecurity (08/15/2023)   Hunger Vital Sign    Worried About Running Out of Food in the Last Year: Never true    Ran Out of Food in the Last Year: Never true  Transportation Needs: No Transportation Needs (08/15/2023)   PRAPARE - Administrator, Civil Service (Medical): No    Lack of Transportation (Non-Medical): No  Physical Activity: Unknown (08/15/2023)   Exercise Vital Sign    Days of Exercise per Week: 0 days    Minutes of Exercise per Session: Not on file  Stress: No Stress Concern Present (08/15/2023)   [employer] of Occupational Health - Occupational Stress Questionnaire    Feeling of Stress : Only a little  Social Connections: Unknown (08/15/2023)   Social Connection and Isolation Panel [NHANES]    Frequency of Communication with Friends and Family: Never    Frequency of Social Gatherings with Friends and Family: Once a week    Attends Religious Services: Patient declined    Database administrator or Organizations: No    Attends Engineer, structural: Not on file    Marital Status: Widowed  Intimate Partner Violence: Not on file    ROS Negative unless indicated in HPI    Objective   BP (!) 188/82 (BP Location: Right Arm, Patient Position: Sitting, Cuff Size: Small)   Pulse 63   Temp (!) 97.2 F (36.2 C) (Temporal)   Ht 5\' 1"  (1.549 m)   Wt 130 lb (59 kg)   SpO2 96%   BMI 24.56 kg/m   Physical Exam  Last CBC Lab Results  Component Value Date   WBC 6.5 04/04/2023   HGB 12.4 04/04/2023   HCT 40.1 04/04/2023   MCV 95.2 04/04/2023   MCH 29.5 04/04/2023   RDW 12.5 04/04/2023   PLT 209 04/04/2023   Last metabolic panel Lab Results  Component Value Date   GLUCOSE 72 04/04/2023   NA 140 04/04/2023   K 4.2 04/04/2023   CL 108 04/04/2023   CO2 22 04/04/2023  BUN 17 04/04/2023   CREATININE 0.88 04/04/2023    GFRNONAA >60 04/04/2023   CALCIUM 9.0 04/04/2023   PROT 7.5 05/06/2022   ALBUMIN 3.9 05/06/2022   BILITOT 0.4 05/06/2022   ALKPHOS 71 05/06/2022   AST 24 05/06/2022   ALT 14 05/06/2022   ANIONGAP 10 04/04/2023   Last lipids Lab Results  Component Value Date   CHOL 235 (H) 05/09/2023   HDL 75 05/09/2023   LDLCALC 134 (H) 05/09/2023   TRIG 135 05/09/2023   CHOLHDL 3.1 05/09/2023   Last hemoglobin A1c Lab Results  Component Value Date   HGBA1C 6.4 (H) 05/09/2023      Assessment & Plan:  Essential hypertension -     Comprehensive metabolic panel -     Lisinopril-hydroCHLOROthiazide; Take 1 tablet by mouth daily.  Dispense: 90 tablet; Refill: 0  Mixed hyperlipidemia -     Lipid panel  Dementia, Lewy body with behavior disturbance (HCC)  Type 2 diabetes mellitus with diabetic neuropathy, with long-term current use of insulin (HCC)  Encounter for general adult medical examination with abnormal findings -     CBC with Differential/Platelet -     Comprehensive metabolic panel -     Lipid panel -     Thyroid Profile   Health maintenance: Singles and T-dap administered Return in about 3 months (around 11/15/2023) for chronic diseases management.   Arrie Aran Santa Lighter, [facility] [address] [place], [place] [address] [phone]  Note: This document was prepared by Reubin Milan voice dictation technology and any errors that results from this process are unintentional.

## 2023-08-16 LAB — CBC WITH DIFFERENTIAL/PLATELET
Basophils Absolute: 0 10*3/uL (ref 0.0–0.2)
Basos: 1 %
EOS (ABSOLUTE): 0.1 10*3/uL (ref 0.0–0.4)
Eos: 1 %
Hematocrit: 38.4 % (ref 34.0–46.6)
Hemoglobin: 12.8 g/dL (ref 11.1–15.9)
Immature Grans (Abs): 0 10*3/uL (ref 0.0–0.1)
Immature Granulocytes: 0 %
Lymphocytes Absolute: 2.4 10*3/uL (ref 0.7–3.1)
Lymphs: 36 %
MCH: 30.1 pg (ref 26.6–33.0)
MCHC: 33.3 g/dL (ref 31.5–35.7)
MCV: 90 fL (ref 79–97)
Monocytes Absolute: 0.5 10*3/uL (ref 0.1–0.9)
Monocytes: 7 %
Neutrophils Absolute: 3.7 10*3/uL (ref 1.4–7.0)
Neutrophils: 55 %
Platelets: 218 10*3/uL (ref 150–450)
RBC: 4.25 x10E6/uL (ref 3.77–5.28)
RDW: 12.3 % (ref 11.7–15.4)
WBC: 6.7 10*3/uL (ref 3.4–10.8)

## 2023-08-16 LAB — COMPREHENSIVE METABOLIC PANEL
ALT: 19 IU/L (ref 0–32)
AST: 26 IU/L (ref 0–40)
Albumin: 4 g/dL (ref 3.7–4.7)
Alkaline Phosphatase: 70 IU/L (ref 44–121)
BUN/Creatinine Ratio: 17 (ref 12–28)
BUN: 16 mg/dL (ref 8–27)
Bilirubin Total: 0.3 mg/dL (ref 0.0–1.2)
CO2: 26 mmol/L (ref 20–29)
Calcium: 9.7 mg/dL (ref 8.7–10.3)
Chloride: 103 mmol/L (ref 96–106)
Creatinine, Ser: 0.94 mg/dL (ref 0.57–1.00)
Globulin, Total: 3.3 g/dL (ref 1.5–4.5)
Glucose: 61 mg/dL — ABNORMAL LOW (ref 70–99)
Potassium: 4.4 mmol/L (ref 3.5–5.2)
Sodium: 143 mmol/L (ref 134–144)
Total Protein: 7.3 g/dL (ref 6.0–8.5)
eGFR: 59 mL/min/{1.73_m2} — ABNORMAL LOW (ref >59)

## 2023-08-16 LAB — THYROID PANEL
Free Thyroxine Index: 1.7 (ref 1.2–4.9)
T3 Uptake Ratio: 24 % (ref 24–39)
T4, Total: 7 ug/dL (ref 4.5–12.0)

## 2023-08-16 LAB — LIPID PANEL
Chol/HDL Ratio: 2 ratio (ref 0.0–4.4)
Cholesterol, Total: 181 mg/dL (ref 100–199)
HDL: 89 mg/dL (ref >39)
LDL Chol Calc (NIH): 78 mg/dL (ref 0–99)
Triglycerides: 78 mg/dL (ref 0–149)
VLDL Cholesterol Cal: 14 mg/dL (ref 5–40)

## 2023-08-17 LAB — HGB A1C W/O EAG: Hgb A1c MFr Bld: 6.9 % — ABNORMAL HIGH (ref 4.8–5.6)

## 2023-08-17 LAB — SPECIMEN STATUS REPORT

## 2023-09-15 ENCOUNTER — Encounter: Payer: Self-pay | Admitting: Physician Assistant

## 2023-09-16 ENCOUNTER — Other Ambulatory Visit: Payer: Self-pay | Admitting: Family

## 2023-09-18 NOTE — Telephone Encounter (Signed)
 Patient had new Provider

## 2023-09-26 ENCOUNTER — Other Ambulatory Visit: Payer: Self-pay

## 2023-09-26 DIAGNOSIS — E1140 Type 2 diabetes mellitus with diabetic neuropathy, unspecified: Secondary | ICD-10-CM

## 2023-09-26 MED ORDER — NOVOLIN 70/30 FLEXPEN (70-30) 100 UNIT/ML ~~LOC~~ SUPN: 20.0000 [IU] | PEN_INJECTOR | Freq: Every day | SUBCUTANEOUS | 3 refills | Status: DC

## 2023-10-05 ENCOUNTER — Other Ambulatory Visit: Payer: Self-pay | Admitting: Nurse Practitioner

## 2023-10-05 ENCOUNTER — Telehealth: Payer: Self-pay

## 2023-10-05 MED ORDER — ONETOUCH VERIO VI STRP: ORAL_STRIP | 1 refills | Status: AC

## 2023-10-05 NOTE — Telephone Encounter (Signed)
 Copied from CRM [id number]. Topic: Clinical - Medication Refill >> Oct 05, 2023 10:32 AM Emylou G wrote: Medication: divalproex  (DEPAKOTE ) 125 MG DR tablet  Has the patient contacted their pharmacy? No (Agent: If no, request that the patient contact the pharmacy for the refill. If patient does not wish to contact the pharmacy document the reason why and proceed with request.) (Agent: If yes, when and what did the pharmacy advise?)  This is the patient's preferred pharmacy:  [id number] - [place], [place] - [address] [place] [address] Phone: [phone] Fax: [phone]  Is this the correct pharmacy for this prescription? Yes If no, delete pharmacy and type the correct one.   Has the prescription been filled recently? No  Is the patient out of the medication? Yes  Has the patient been seen for an appointment in the last year OR does the patient have an upcoming appointment? Yes  Can we respond through MyChart? Yes  Agent: Please be advised that Rx refills may take up to 3 business days. We ask that you follow-up with your pharmacy.

## 2023-10-05 NOTE — Telephone Encounter (Signed)
 Copied from CRM [id number]. Topic: Clinical - Medication Prior Auth >> Oct 05, 2023 11:05 AM Shelby Dessert H wrote: Reason for CRM: Patient daughter called and stated she needs a Prior Auth for [employer] her medicine and that the patients needs it so they will cover her insulin  and strips and whatever else she needs for her diabetes

## 2023-10-05 NOTE — Telephone Encounter (Signed)
 Copied from CRM [id number]. Topic: Clinical - Medication Refill >> Oct 05, 2023 10:35 AM Emylou G wrote: Medication: glucose blood (ONETOUCH VERIO) test strip  Has the patient contacted their pharmacy? No (Agent: If no, request that the patient contact the pharmacy for the refill. If patient does not wish to contact the pharmacy document the reason why and proceed with request.) (Agent: If yes, when and what did the pharmacy advise?)  This is the patient's preferred pharmacy:  [facility] - [place], [place] - [address] [place] [address] Phone: [phone] Fax: [phone]  Is this the correct pharmacy for this prescription? Yes If no, delete pharmacy and type the correct one.   Has the prescription been filled recently? No  Is the patient out of the medication? No .. almost  Has the patient been seen for an appointment in the last year OR does the patient have an upcoming appointment? Yes  Can we respond through MyChart? Yes  Agent: Please be advised that Rx refills may take up to 3 business days. We ask that you follow-up with your pharmacy.

## 2023-10-08 DIAGNOSIS — H5213 Myopia, bilateral: Secondary | ICD-10-CM | POA: Diagnosis not present

## 2023-10-09 ENCOUNTER — Other Ambulatory Visit: Payer: Self-pay | Admitting: Nurse Practitioner

## 2023-10-09 ENCOUNTER — Ambulatory Visit

## 2023-10-09 VITALS — BP 130/88 | HR 67 | Ht 61.0 in | Wt 130.0 lb

## 2023-10-09 DIAGNOSIS — Z0000 Encounter for general adult medical examination without abnormal findings: Secondary | ICD-10-CM | POA: Diagnosis not present

## 2023-10-09 NOTE — Patient Instructions (Signed)
 Robyn [name] , Thank you for taking time out of your busy schedule to complete your Annual Wellness Visit with me. I enjoyed our conversation and look forward to speaking with you again next year. I, as well as your care team,  appreciate your ongoing commitment to your health goals. Please review the following plan we discussed and let me know if I can assist you in the future. Your Game plan/ To Do List    Follow up Visits: Next Medicare AWV with our clinical staff: Wed. 10/09/24 at 12:30p.m .   Have you seen your provider in the last 6 months (3 months if uncontrolled diabetes)? Yes Next Office Visit with your provider: 11/16/23 at 3:30p.m.  Clinician Recommendations:  Aim for 30 minutes of exercise or brisk walking, 6-8 glasses of water, and 5 servings of fruits and vegetables each day. N/a      This is a list of the screening recommended for you and due dates:  Health Maintenance  Topic Date Due   Eye exam for diabetics  Never done   COVID-19 Vaccine (6 - 2024-25 season) 01/22/2023   Zoster (Shingles) Vaccine (2 of 2) 10/10/2023   Flu Shot  12/22/2023   Hemoglobin A1C  02/15/2024   Complete foot exam   08/14/2024   Medicare Annual Wellness Visit  10/08/2024   DTaP/Tdap/Td vaccine (2 - Td or Tdap) 08/14/2033   Pneumonia Vaccine  Completed   DEXA scan (bone density measurement)  Completed   HPV Vaccine  Aged Out   Meningitis B Vaccine  Aged Out    Advanced directives: (Declined) Advance directive discussed with you today. Even though you declined this today, please call our office should you change your mind, and we can give you the proper paperwork for you to fill out. Advance Care Planning is important because it:  [x]  Makes sure you receive the medical care that is consistent with your values, goals, and preferences  [x]  It provides guidance to your family and loved ones and reduces their decisional burden about whether or not they are making the right decisions based on your  wishes.  Follow the link provided in your after visit summary or read over the paperwork we have mailed to you to help you started getting your Advance Directives in place. If you need assistance in completing these, please reach out to us  so that we can help you!  See attachments for Preventive Care and Fall Prevention Tips.

## 2023-10-09 NOTE — Telephone Encounter (Signed)
 Copied from CRM [id number]. Topic: Clinical - Medication Refill >> Oct 09, 2023 11:22 AM Baldomero Bone wrote: Medication: divalproex  (DEPAKOTE ) 125 MG DR tablet  Has the patient contacted their pharmacy? No (Agent: If no, request that the patient contact the pharmacy for the refill. If patient does not wish to contact the pharmacy document the reason why and proceed with request.) (Agent: If yes, when and what did the pharmacy advise?)  This is the patient's preferred pharmacy:  [id number] - [place], [place] - [address] [place] [address] Phone: [phone] Fax: [phone]  Is this the correct pharmacy for this prescription? Yes If no, delete pharmacy and type the correct one.   Has the prescription been filled recently? No  Is the patient out of the medication? Yes  Has the patient been seen for an appointment in the last year OR does the patient have an upcoming appointment? Yes  Can we respond through MyChart? Yes  Agent: Please be advised that Rx refills may take up to 3 business days. We ask that you follow-up with your pharmacy.

## 2023-10-09 NOTE — Progress Notes (Signed)
 Subjective:   Robyn [name] is a 89 y.o. who presents for a Medicare Wellness preventive visit.  As a reminder, Annual Wellness Visits don't include a physical exam, and some assessments may be limited, especially if this visit is performed virtually. We may recommend an in-person follow-up visit with your provider if needed.  Visit Complete: Virtual I connected with  Robyn [name] on 10/09/23 by a audio enabled telemedicine application and verified that I am speaking with the correct person using two identifiers.  Patient Location: Home  Provider Location: Home Office  I discussed the limitations of evaluation and management by telemedicine. The patient expressed understanding and agreed to proceed.  Vital Signs: Because this visit was a virtual/telehealth visit, some criteria may be missing or patient reported. Any vitals not documented were not able to be obtained and vitals that have been documented are patient reported.  VideoDeclined- This patient declined Librarian, academic. Therefore the visit was completed with audio only.  Persons Participating in Visit: Patient assisted by pt's daughter, Robyn [name].  AWV Questionnaire: No: Patient Medicare AWV questionnaire was not completed prior to this visit.  Cardiac Risk Factors include: advanced age (>22men, >70 women);diabetes mellitus     Objective:     Today's Vitals   10/09/23 1314 10/09/23 1316  BP: 130/88   Pulse: 67   Weight: 130 lb (59 kg)   Height: 5\' 1"  (1.549 m)   PainSc:  6    Body mass index is 24.56 kg/m.     10/09/2023    1:35 PM 05/12/2023    3:23 PM 04/04/2023   11:35 AM 02/21/2023    1:56 PM 02/16/2023   12:25 PM 01/11/2023    2:13 PM 10/20/2022    2:17 PM  Advanced Directives  Does Patient Have a Medical Advance Directive? No No Yes No No No Yes  Type of Surveyor, minerals;Living will    Healthcare Power of Attorney  Does patient  want to make changes to medical advance directive?       No - Patient declined  Copy of Healthcare Power of Attorney in Chart?       No - copy requested  Would patient like information on creating a medical advance directive?  No - Patient declined  No - Patient declined No - Patient declined No - Patient declined     Current Medications (verified) Outpatient Encounter Medications as of 10/09/2023  Medication Sig   ASPIRIN 81 PO Take 81 mg by mouth 2 (two) times a week.   atorvastatin  (LIPITOR) 10 MG tablet Take 1 tablet (10 mg total) by mouth daily.   diclofenac  Sodium (VOLTAREN ) 1 % GEL Apply 2 g topically 4 (four) times daily.   divalproex  (DEPAKOTE ) 125 MG DR tablet Take 1 tablet (125 mg total) by mouth 2 (two) times daily.   glucose blood (ONETOUCH VERIO) test strip Use to test blood sugar three times daily. Dx: E11.40   insulin  isophane & regular human KwikPen (NOVOLIN  70/30 KWIKPEN) (70-30) 100 UNIT/ML KwikPen Inject 20 Units into the skin daily. If fasting or evening blood sugar is 85 or less, reduce units by 3. DX: E11.40   Insulin  Pen Needle 31G X 6 MM MISC Use for insulin  once daily. Dx: E11.40   Lancets (ONETOUCH ULTRASOFT) lancets Use to test blood sugar three time daily. Dx: E11.40   lisinopril -hydrochlorothiazide  (ZESTORETIC ) 10-12.5 MG tablet Take 1 tablet by mouth daily.   No facility-administered  encounter medications on file as of 10/09/2023.    Allergies (verified) Patient has no known allergies.   History: Past Medical History:  Diagnosis Date   Coronary artery disease    Dementia (HCC)    Diabetes mellitus    Glaucoma    Hallucination    Heart disease    History of heart artery stent 2009   Hypertension    Memory loss    just started on aricept    Mild cognitive impairment    Osteoarthritis    Phobia    Psychosis (HCC)    Stroke (HCC)    Vision abnormalities    Past Surgical History:  Procedure Laterality Date   CORONARY ANGIOPLASTY WITH STENT  PLACEMENT     CORONARY STENT PLACEMENT     Family History  Problem Relation Age of Onset   Hypertension Mother    Diabetes Mother    Hypertension Father    Diabetes Father    Hypertension Sister    Diabetes Sister    Kidney disease Sister    Liver disease Brother    Hypertension Daughter    Fibromyalgia Daughter    Rheum arthritis Daughter    Breast cancer Daughter    Hypertension Son    Seizures Granddaughter    Epilepsy Granddaughter    Social History   Socioeconomic History   Marital status: Widowed    Spouse name: Not on file   Number of children: Not on file   Years of education: 10   Highest education level: 10th grade  Occupational History   Occupation: retired  Tobacco Use   Smoking status: Former    Current packs/day: 0.00    Types: Cigarettes    Quit date: 10/08/1963    Years since quitting: 60.0   Smokeless tobacco: Never  Vaping Use   Vaping status: Never Used  Substance and Sexual Activity   Alcohol use: No   Drug use: No   Sexual activity: Not on file  Other Topics Concern   Not on file  Social History Narrative   Diet:      Caffeine:      Married, if yes what year:       Do you live in a house, apartment, assisted living, condo, trailer, ect: Apartment      Is it one or more stories: one      How many persons live in your home? Self      Pets: No      Highest level or education completed: 10 th      Current/Past profession: Museum/gallery curator      Exercise:                  Type and how often:          Living Will: No   DNR: No   POA/HPOA: No      Functional Status:   Do you have difficulty bathing or dressing yourself? No   Do you have difficulty preparing food or eating?   Do you have difficulty managing your medications?   Do you have difficulty managing your finances?   Do you have difficulty affording your medications? No      Right handed       Social Drivers of Health   Financial Resource Strain: Low Risk   (10/09/2023)   Overall Financial Resource Strain (CARDIA)    Difficulty of Paying Living Expenses: Not hard at all  Food Insecurity: No Food Insecurity (08/15/2023)  Hunger Vital Sign    Worried About Running Out of Food in the Last Year: Never true    Ran Out of Food in the Last Year: Never true  Transportation Needs: No Transportation Needs (10/09/2023)   PRAPARE - Administrator, Civil Service (Medical): No    Lack of Transportation (Non-Medical): No  Physical Activity: Insufficiently Active (10/09/2023)   Exercise Vital Sign    Days of Exercise per Week: 2 days    Minutes of Exercise per Session: 10 min  Stress: No Stress Concern Present (10/09/2023)   [employer] of Occupational Health - Occupational Stress Questionnaire    Feeling of Stress : Only a little  Social Connections: Socially Isolated (10/09/2023)   Social Connection and Isolation Panel [NHANES]    Frequency of Communication with Friends and Family: More than three times a week    Frequency of Social Gatherings with Friends and Family: Once a week    Attends Religious Services: Never    Database administrator or Organizations: No    Attends Banker Meetings: Never    Marital Status: Widowed    Tobacco Counseling Counseling given: Yes    Clinical Intake:  Pre-visit preparation completed: Yes  Pain : 0-10 (back/shoulder hurt a little bit) Pain Score: 6  Pain Type: Other (Comment) (been several years) Pain Location:  (back/shoulder hurt a little bit) Pain Orientation: Lower Pain Descriptors / Indicators: Aching Pain Onset: Other (comment) (couple years) Pain Frequency: Intermittent (early morning) Effect of Pain on Daily Activities: no     BMI - recorded: 24.56 Nutritional Status: BMI of 19-24  Normal Nutritional Risks: None Diabetes: Yes CBG done?: Yes (279)  Lab Results  Component Value Date   HGBA1C 6.9 (H) 08/15/2023   HGBA1C 6.4 (H) 05/09/2023   HGBA1C 7.0 (H)  09/07/2022     How often do you need to have someone help you when you read instructions, pamphlets, or other written materials from your doctor or pharmacy?: 3 - Sometimes (daughter help w/reading)  Interpreter Needed?: No  Comments: this awv was completed w/the assistance of pt's daughter-Robyn [name] Information entered by :: Alia T/cma   Activities of Daily Living     10/09/2023    1:29 PM  In your present state of health, do you have any difficulty performing the following activities:  Hearing? 1  Vision? 0  Comment pt wear glasses  Difficulty concentrating or making decisions? 1  Walking or climbing stairs? 1  Comment needs assistance due to balance  Dressing or bathing? 0  Comment only bathing/dressing herself  Doing errands, shopping? 1  Comment daughter drives to/from dr Printmaker and eating ? Y  Comment daughter helps w/cooking  Using the Toilet? N  In the past six months, have you accidently leaked urine? N  Do you have problems with loss of bowel control? N  Managing your Medications? Y  Comment daughter helps  Managing your Finances? Y  Comment daughter helps  Housekeeping or managing your Housekeeping? Y  Comment daughter helps    Patient Care Team: Villa Greaser, Adell Hones, NP as PCP - General (Nurse Practitioner) Jhonny Moss, MD as Consulting Physician (Neurology) Michalene Agee Olive Better, DPM as Consulting Physician (Podiatry) Alane Allen, Sara E, PA-C (Neurology)  Indicate any recent Medical Services you may have received from other than [facility] providers in the past year (date may be approximate).     Assessment:    This is a routine wellness examination  for [place].  Hearing/Vision screen Hearing Screening - Comments:: Pt has some hearing dif Vision Screening - Comments:: Pt wears glasses/pt [facility] in [place] in [place], [place]   Goals Addressed             This Visit's Progress    DIET - INCREASE WATER INTAKE   On track      -  will take 3 bottles 16 oz each water       Depression Screen     10/09/2023    1:41 PM 08/15/2023    4:01 PM 05/09/2023    1:05 PM 05/09/2023   12:55 PM 09/30/2022    1:44 PM 06/29/2022    4:09 PM 04/27/2021    1:32 PM  PHQ 2/9 Scores  PHQ - 2 Score 2 3 0 0 0 0 0  PHQ- 9 Score 8 13         Fall Risk     10/09/2023    1:27 PM 08/15/2023    4:02 PM 05/09/2023    1:04 PM 05/09/2023   12:55 PM 04/04/2023    9:06 AM  Fall Risk   Falls in the past year? 0 1 1 0 1  Number falls in past yr: 0 1 0 0 1  Injury with Fall? 1 1 1  0 1  Risk for fall due to : Impaired balance/gait;Impaired mobility Impaired balance/gait Impaired balance/gait;Impaired mobility History of fall(s);Impaired balance/gait;Impaired mobility History of fall(s);Impaired balance/gait;Impaired mobility  Follow up Falls prevention discussed;Falls evaluation completed;Education provided Falls evaluation completed Falls prevention discussed;Education provided Falls evaluation completed Falls evaluation completed;Education provided;Falls prevention discussed    MEDICARE RISK AT HOME:  Medicare Risk at Home Any stairs in or around the home?: No If so, are there any without handrails?: No Home free of loose throw rugs in walkways, pet beds, electrical cords, etc?: Yes Adequate lighting in your home to reduce risk of falls?: Yes Life alert?: No Use of a cane, walker or w/c?: Yes Grab bars in the bathroom?: Yes Shower chair or bench in shower?: Yes Elevated toilet seat or a handicapped toilet?: Yes  TIMED UP AND GO:  Was the test performed?  no  Cognitive Function: 6CIT completed    05/12/2023    5:00 PM 10/20/2022    3:00 PM 04/08/2022    1:00 PM 10/28/2020    3:00 PM 06/05/2020   10:00 AM  MMSE - Mini Mental State Exam  Orientation to time 3 4 5 5 5   Orientation to Place 2 5 5 5 2   Registration 3 3 3 3  0  Attention/ Calculation 5 5 5  0 0  Recall 3 1 3 1  0  Language- name 2 objects 2 2 2 2 2   Language-  repeat 1 1 1 1 1   Language- follow 3 step command 3 3 3 3 3   Language- read & follow direction 1 1 1 1 1   Write a sentence 1 1 1 1 1   Copy design 0 1 1 1  0  Total score 24 27 30 23 15       12/02/2014   10:00 AM  Montreal Cognitive Assessment   Visuospatial/ Executive (0/5) 3  Naming (0/3) 3  Attention: Read list of digits (0/2) 2  Attention: Read list of letters (0/1) 1  Attention: Serial 7 subtraction starting at 100 (0/3) 3  Language: Repeat phrase (0/2) 2  Language : Fluency (0/1) 0  Abstraction (0/2) 2  Delayed Recall (0/5) 3  Orientation (0/6) 6  Total 25  Adjusted Score (based on education) 25      10/09/2023    1:45 PM 09/30/2022    1:44 PM  6CIT Screen  What Year? 0 points 0 points  What month? 0 points 0 points  What time? 0 points 0 points  Count back from 20 0 points 0 points  Months in reverse 4 points 4 points  Repeat phrase 10 points 0 points  Total Score 14 points 4 points    Immunizations Immunization History  Administered Date(s) Administered   Fluad Quad(high Dose 65+) 04/27/2021, 05/04/2022   Fluad Trivalent(High Dose 65+) 02/21/2023   PFIZER Comirnaty Aaron AasGray Top)Covid-19 Tri-Sucrose Vaccine 10/12/2020   PFIZER(Purple Top)SARS-COV-2 Vaccination 09/05/2019, 09/30/2019   PNEUMOCOCCAL CONJUGATE-20 05/09/2023   Pfizer Covid-19 Vaccine Bivalent Booster 54yrs & up 04/14/2021   Pfizer(Comirnaty )Fall Seasonal Vaccine 12 years and older 07/06/2022   Tdap 08/15/2023   Zoster Recombinant(Shingrix ) 08/15/2023    Screening Tests Health Maintenance  Topic Date Due   OPHTHALMOLOGY EXAM  Never done   COVID-19 Vaccine (6 - 2024-25 season) 10/24/2024 (Originally 01/22/2023)   Zoster Vaccines- Shingrix  (2 of 2) 10/10/2023   INFLUENZA VACCINE  12/22/2023   HEMOGLOBIN A1C  02/15/2024   FOOT EXAM  08/14/2024   Medicare Annual Wellness (AWV)  10/08/2024   DTaP/Tdap/Td (2 - Td or Tdap) 08/14/2033   Pneumonia Vaccine 64+ Years old  Completed   DEXA SCAN  Completed    HPV VACCINES  Aged Out   Meningococcal B Vaccine  Aged Out    Health Maintenance  Health Maintenance Due  Topic Date Due   OPHTHALMOLOGY EXAM  Never done   Health Maintenance Items Addressed: See Nurse Notes  Additional Screening:  Vision Screening: Recommended annual ophthalmology exams for early detection of glaucoma and other disorders of the eye.  Dental Screening: Recommended annual dental exams for proper oral hygiene  Community Resource Referral / Chronic Care Management: CRR required this visit?  Yes   CCM required this visit?  No   Plan:    I have personally reviewed and noted the following in the patient's chart:   Medical and social history Use of alcohol, tobacco or illicit drugs  Current medications and supplements including opioid prescriptions. Patient is not currently taking opioid prescriptions. Functional ability and status Nutritional status Physical activity Advanced directives List of other physicians Hospitalizations, surgeries, and ER visits in previous 12 months Vitals Screenings to include cognitive, depression, and falls Referrals and appointments  In addition, I have reviewed and discussed with patient certain preventive protocols, quality metrics, and best practice recommendations. A written personalized care plan for preventive services as well as general preventive health recommendations were provided to patient.   Michaelle Adolphus, CMA   10/09/2023   After Visit Summary: (MyChart) Due to this being a telephonic visit, the after visit summary with patients personalized plan was offered to patient via MyChart   Notes: Please refer to Routing Comments.

## 2023-10-11 ENCOUNTER — Other Ambulatory Visit (HOSPITAL_COMMUNITY): Payer: Self-pay

## 2023-10-12 ENCOUNTER — Other Ambulatory Visit: Payer: Self-pay

## 2023-10-12 ENCOUNTER — Other Ambulatory Visit: Payer: Self-pay | Admitting: Nurse Practitioner

## 2023-10-12 DIAGNOSIS — I10 Essential (primary) hypertension: Secondary | ICD-10-CM

## 2023-10-12 DIAGNOSIS — F02818 Dementia in other diseases classified elsewhere, unspecified severity, with other behavioral disturbance: Secondary | ICD-10-CM

## 2023-10-12 MED ORDER — LISINOPRIL-HYDROCHLOROTHIAZIDE 10-12.5 MG PO TABS: 1.0000 | ORAL_TABLET | Freq: Every day | ORAL | 0 refills | Status: DC

## 2023-10-12 MED ORDER — DIVALPROEX SODIUM 125 MG PO DR TAB: 125.0000 mg | DELAYED_RELEASE_TABLET | Freq: Two times a day (BID) | ORAL | 4 refills | Status: AC

## 2023-10-12 NOTE — Telephone Encounter (Signed)
 Copied from CRM [id number]. Topic: Clinical - Medication Refill >> Oct 12, 2023  9:40 AM Ethelle Herb L wrote: Medication: divalproex  divalproex  (DEPAKOTE ) 125 MG DR tablet Was previously prescribed by Christean Courts, NP and now requesting Adell Hones take over rx.  Please reach out to daughter, Kelvin Pattee if rx is denied.   lisinopril -hydrochlorothiazide  (ZESTORETIC ) 10-12.5 MG tablet - Requesting new rx be put in, pt not in need of refill now but daughter would like to ensure rx is sent in.   Has the patient contacted their pharmacy? Yes This is the patient's preferred pharmacy:  [facility] - [place], [place] - [address] [place] [address] Phone: [phone] Fax: [phone]  Is this the correct pharmacy for this prescription? Yes  Has the prescription been filled recently? No  Is the patient out of the medication? Yes  Has the patient been seen for an appointment in the last year OR does the patient have an upcoming appointment? Yes  Can we respond through MyChart? No, prefer a phone call.   Agent: Please be advised that Rx refills may take up to 3 business days. We ask that you follow-up with your pharmacy.

## 2023-10-17 ENCOUNTER — Encounter: Payer: Self-pay | Admitting: Nurse Practitioner

## 2023-10-17 ENCOUNTER — Ambulatory Visit (INDEPENDENT_AMBULATORY_CARE_PROVIDER_SITE_OTHER): Admitting: Nurse Practitioner

## 2023-10-17 VITALS — BP 152/71 | HR 65 | Temp 97.4°F | Ht 61.0 in | Wt 125.4 lb

## 2023-10-17 DIAGNOSIS — R634 Abnormal weight loss: Secondary | ICD-10-CM | POA: Diagnosis not present

## 2023-10-17 DIAGNOSIS — J0110 Acute frontal sinusitis, unspecified: Secondary | ICD-10-CM

## 2023-10-17 DIAGNOSIS — J301 Allergic rhinitis due to pollen: Secondary | ICD-10-CM | POA: Diagnosis not present

## 2023-10-17 DIAGNOSIS — R051 Acute cough: Secondary | ICD-10-CM | POA: Diagnosis not present

## 2023-10-17 MED ORDER — LEVOCETIRIZINE DIHYDROCHLORIDE 5 MG PO TABS: 2.5000 mg | ORAL_TABLET | Freq: Every evening | ORAL | 0 refills | Status: DC

## 2023-10-17 MED ORDER — AZITHROMYCIN 250 MG PO TABS: ORAL_TABLET | ORAL | 0 refills | Status: AC

## 2023-10-17 MED ORDER — BENZONATATE 100 MG PO CAPS: 100.0000 mg | ORAL_CAPSULE | Freq: Three times a day (TID) | ORAL | 0 refills | Status: DC | PRN

## 2023-10-17 MED ORDER — ENSURE NUTRITION SHAKE PO LIQD: 1.0000 | Freq: Two times a day (BID) | ORAL | 2 refills | Status: AC

## 2023-10-17 NOTE — Progress Notes (Signed)
 Acute Office Visit  Subjective:     Patient ID: Robyn [name], female    DOB: 09-07-1934, 89 y.o.   MRN: [id number]  Chief Complaint  Patient presents with   Sore Throat    Symptoms for 3-4 days    Nasal Congestion   Robyn [name] is a 89 y.o. female presents 10/17/2023 for an acute who complains of congestion, sneezing, post nasal drip, and cough described as productive of yellow sputum for 5 days. She denies a history of chest pain, chills, dizziness, fatigue, and vomiting and denies a history of asthma. Per her daughter she lost 5 lbs since her last visits  in March Sore Throat  Associated symptoms include congestion, coughing and headaches. Pertinent negatives include no ear pain, shortness of breath or vomiting.   Active Ambulatory Problems    Diagnosis Date Noted   HYPERLIPIDEMIA 07/16/2008   Anxiety state 07/16/2008   Phobia 07/16/2008   Unspecified glaucoma 07/16/2008   Essential hypertension 07/16/2008   CORONARY ARTERY DISEASE, S/P PTCA 07/16/2008   ALLERGIC RHINITIS, SEASONAL 07/16/2008   Osteoarthritis 07/16/2008   HALLUCINATION 07/16/2008   Insomnia 07/16/2008   WEIGHT LOSS, ABNORMAL 07/16/2008   DYSPNEA 07/16/2008   PERSONAL HX TIA & CI W/O RESIDUAL DEFICITS 07/16/2008   Mild cognitive impairment 12/02/2014   Dementia, Lewy body with behavior disturbance (HCC) 11/05/2020   Heart disease 11/18/2020   Vitamin D deficiency 11/18/2020   Type 2 diabetes mellitus with diabetic neuropathy, unspecified (HCC) 04/27/2021   Bed sore on heel 11/17/2021   Diabetic neuropathy (HCC) 11/01/2022   Encounter for general adult medical examination with abnormal findings 08/15/2023   Mixed hyperlipidemia 08/15/2023   Resolved Ambulatory Problems    Diagnosis Date Noted   General psychoses (HCC) 12/02/2014   Psychosis (HCC) 12/02/2014   Skin ulcer of left great toe with fat layer exposed (HCC) 08/15/2023   Past Medical History:  Diagnosis Date   Coronary  artery disease    Dementia (HCC)    Diabetes mellitus    Glaucoma    Hallucination    History of heart artery stent 2009   Hypertension    Memory loss    Stroke [facility])    Vision abnormalities     Review of Systems  Constitutional:  Negative for chills and fever.  HENT:  Positive for congestion and sore throat. Negative for ear pain.   Respiratory:  Positive for cough. Negative for shortness of breath and wheezing.        Yellow sputum  Cardiovascular:  Negative for chest pain and leg swelling.  Gastrointestinal:  Negative for nausea and vomiting.  Skin:  Negative for itching and rash.  Neurological:  Positive for headaches. Negative for dizziness.   Negative unless indicated in HPI    Objective:    BP (!) 152/71   Pulse 65   Temp (!) 97.4 F (36.3 C) (Temporal)   Ht 5\' 1"  (1.549 m)   Wt 125 lb 6.4 oz (56.9 kg)   SpO2 98%   BMI 23.69 kg/m  BP Readings from Last 3 Encounters:  10/17/23 (!) 152/71  10/09/23 130/88  08/15/23 (!) 188/82   Wt Readings from Last 3 Encounters:  10/17/23 125 lb 6.4 oz (56.9 kg)  10/09/23 130 lb (59 kg)  08/15/23 130 lb (59 kg)      Physical Exam Vitals and nursing note reviewed.  Constitutional:      General: She is not in acute distress. HENT:  Head: Normocephalic and atraumatic.     Right Ear: Tympanic membrane, ear canal and external ear normal. There is no impacted cerumen.     Left Ear: Tympanic membrane, ear canal and external ear normal. There is no impacted cerumen.     Nose: Congestion present.     Mouth/Throat:     Mouth: Mucous membranes are moist.  Eyes:     Extraocular Movements: Extraocular movements intact.     Conjunctiva/sclera: Conjunctivae normal.     Pupils: Pupils are equal, round, and reactive to light.  Cardiovascular:     Heart sounds: Normal heart sounds.  Pulmonary:     Effort: Pulmonary effort is normal.     Breath sounds: Normal breath sounds.  Musculoskeletal:        General: Normal range of  motion.     Right lower leg: No edema.     Left lower leg: No edema.  Skin:    General: Skin is warm and dry.     Findings: No rash.  Neurological:     Mental Status: She is alert and oriented to person, place, and time.  Psychiatric:        Mood and Affect: Mood normal.        Behavior: Behavior normal.        Thought Content: Thought content normal.        Judgment: Judgment normal.     No results found for any visits on 10/17/23.      Assessment & Plan:  Acute frontal sinusitis, recurrence not specified -     Azithromycin; Take 2 tablets on day 1, then 1 tablet daily on days 2 through 5  Dispense: 6 tablet; Refill: 0  Acute cough -     Benzonatate; Take 1 capsule (100 mg total) by mouth 3 (three) times daily as needed.  Dispense: 20 capsule; Refill: 0  WEIGHT LOSS, ABNORMAL -     Ensure Nutrition Shake; Take 1 Can by mouth in the morning and at bedtime.  Dispense: 237 mL; Refill: 2  Seasonal allergic rhinitis due to pollen -     Levocetirizine Dihydrochloride; Take 0.5 tablets (2.5 mg total) by mouth every evening.  Dispense: 45 tablet; Refill: 0  Robyn [name] is a 89 year old African-American female seen today for URI, no acute distress  sinusitis: Z-Pak 250 mg no. 6 dispense, she is instructed to take all of it until then   allergic rhinitis: Xyzal 5 mg half a tab at bedtime Cough: Tessalon Perle 100 mg 1 tablet as needed 3 times daily Underweight: Ensure 1 can twice daily Symptomatic therapy suggested: push fluids, rest, and return office visit prn if symptoms persist or worsen. . Call or return to clinic prn if these symptoms worsen or fail to improve as anticipated.     The above assessment and management plan was discussed with the patient. The patient verbalized understanding of and has agreed to the management plan. Patient is aware to call the clinic if they develop any new symptoms or if symptoms persist or worsen. Patient is aware when to return to the clinic for a  follow-up visit. Patient educated on when it is appropriate to go to the emergency department.  Return for as already scheduled.  Shadrick Senne [place] Thompson, DNP [facility] [address] [place], [place] [address] [phone]  Note: This document was prepared by [employer] voice dictation technology and any errors that results from this process are unintentional.

## 2023-11-10 ENCOUNTER — Ambulatory Visit (INDEPENDENT_AMBULATORY_CARE_PROVIDER_SITE_OTHER): Admitting: Physician Assistant

## 2023-11-10 ENCOUNTER — Encounter: Payer: Self-pay | Admitting: Physician Assistant

## 2023-11-10 VITALS — BP 147/68 | HR 71 | Ht 61.0 in | Wt 121.0 lb

## 2023-11-10 DIAGNOSIS — F02818 Dementia in other diseases classified elsewhere, unspecified severity, with other behavioral disturbance: Secondary | ICD-10-CM | POA: Diagnosis not present

## 2023-11-10 DIAGNOSIS — G3183 Dementia with Lewy bodies: Secondary | ICD-10-CM

## 2023-11-10 NOTE — Progress Notes (Signed)
 Assessment/Plan:   Dementia likely due to Lewy body disease with behavioral disturbance   Robyn [name] is a very pleasant 89 y.o. RH female with a history of Lewy body dementia with behavioral disturbance seen today in follow up for memory loss. Patient is not on antidementia medication per daughter's request as the risk outweigh the benefits.  She is on Depakote  125 mg initially prescribed at night then twice a day if needed, but the patient changed it to 250 mg during the day, which in turn causes her to be awake at night, and sundowning.  We discussed with her daughter the plan, switching that for the evening at 250 mg nightly for sundowning and hallucinations and sleep and if needed at 125 mg during the day. Memory is stable..  She is able to participate on her ADLs that her ability, but needs more surveillance than prior..    Follow up in 6 months. Continue Depakote  250 mg in the night, may add another 125 mg dose during the day.  Side effects discussed.  Recommend good control of her cardiovascular risk factors Continue to control mood as per PCP     Subjective:    This patient is accompanied in the office by her daughter who supplements the history.  Previous records as well as any outside records available were reviewed prior to todays visit. Patient was last seen on 05/12/2023 with MMSE 24/30    Any changes in memory since last visit? She has been more forgetful and cannot retrieve the words, processing. She has more difficulty  to remembering conversations and names.  Sometimes she feels that the TV personalities are not real to her.  repeats oneself?  Endorsed Disoriented when walking into a room? Denies, but cannot remember the address she lives in.   Leaving objects?  May misplace things especially the phone but not in unusual places   Wandering behavior?  denies   Any personality changes since last visit?  A little more agitated-daughter says Any worsening  depression?:  Denies.   Hallucinations or paranoia?  She continues to talk to  they or them .  Occasionally she feels that there are people from the outside that want to come inside the house or someone taking her purse.  She may see a snake and water entering the room. Seizures? denies    Any sleep changes?  Sleeps well, but does have vivid dreams, denies REM behavior or sleepwalking   Sleep apnea?   Denies.   Any hygiene concerns?  She does not want any help but she needs reminder to shower . Independent of bathing and dressing?  Endorsed  Does the patient needs help with medications?  Daughter is in charge   Who is in charge of the finances?  Daughter is in charge     Any changes in appetite?  denies     Patient have trouble swallowing? Denies.   Does the patient cook? No Any headaches?   denies   Any vision changes?  Chronic back pain  denies   Ambulates with difficulty?  She refuses to use a cane for stability. She feels that someone is behind her touching her back Recent falls or head injuries? Denies.     Unilateral weakness, numbness or tingling? denies   Any tremors?  Denies    Any anosmia?  Denies   Any incontinence of urine?  She has a history of stress incontinence and does not like to wear any diapers or pads.  Any bowel dysfunction?   Denies      Patient lives alone in [place] , her daughter monitors her.  Daughter is considering assisted living or home health nursing. Does the patient drive? No longer drives    HISTORY OF PRESENT ILLNESS 06/05/20: This is an 89 year old right-handed woman with a history of hypertension, hyperlipidemia, diabetes, dementia, presenting for evaluation of dementia. On her PCP appointment in October 2021, daughter reported she is often disoriented to place and time, and hears voices telling her what to do. She lives alone and her daughters check on her multiple times per week and help with housework. She was evaluated by  neurologist Dr. Vear for similar symptoms in 2016. Symptoms had started around 2014. At that time, hallucinations were primarily auditory but sometimes visual. She also reported sleep difficulty with the voices speaking to her when she tries to fall asleep. She had side effects of nausea on Donepezil  and was prescribed Memantine and Seroquel. MOCA 26/30 in 11/2014. Etiology of symptoms at that time unclear, she did not have features of LBD or AD. She was started on hydroxyzine  then was lost to follow-up.   Over the years, Robyn [name] reports that the auditory hallucinations have worsened. The voices would tell her to go outside sometimes, as far as [name] knows she has not wandered outside. She reports heaviness in her truncal region, that she has gained weight, Robyn [name] states she thinks the people are causing the heaviness behind her. The voices tell her that her laundry is not worth keeping. When they are walking, she wants to go a different way because the voices are telling her to go that way. She would offer food when she is eating. She goes to the bathroom and would be heard saying leave her alone. She would stand and stare, and when asked what she is doing, she says she is talking to them. Initially she stated she does not hear anything, then Robyn [name] reminds her it occurs all the time, she talks to them and waves while she is at [employer]. She states they are in a group, in her house, and can be scary for her, keeping her up at night. She only gets 1-2 hours of sleep at night and naps in the day. She recalls Seroquel (and maybe hydroxyzine ) causing dizziness in the past. She says she took these for a long time, but was lost to follow-up.    Robyn [name] thinks her memory is okay. She continues to live alone. She states her memory is sometimes not very well. She does not drive. She continues to manage her own medications and finances, they deny any issues. She denies leaving the stove on. She has occasional word-finding  difficulties. There is no clear REM behavior disorder. She has occasional hand tremors. She always feel like her balance is off, no falls. She denies any headaches, diplopia, dysarthria/dysphagia, neck pain, focal numbness/tingling/weakness, bowel/bladder dysfunction, anosmia. There is no family history of dementia, no history of significant head injuries or alcohol use.     I personally reviewed head CTs done over the years, most recently in 06/2019 which showed moderate diffuse atrophy, there appears to be more in the right frontal and occipital regions.   Neurocognitive testing 10/28/2020 Dr. Jackquline Ms. Stinson demonstrated impaired performance on measures of processing speed, executive function, and her visuospatial and constructional functioning fell at an unusually low level, suggesting some mild difficulties. She also had marked naming problems and diminished phonemic as compared to semantic  fluency. Memory performance was notable for low encoding of information but she retained verbal information well and does not appear to have a frank storage problem at this time. She did well on measures of attention/working memory. Her CDR places her in the mild dementia range based on the history of provided and her objective test performance, although her daughter characterized her as functioning at a moderate dementia level. It is possible that movement or other issues are confounding her rating or perhaps the patient simply tests well. She did score a 4/4 on the Mayo Fluctuations Questionnaire.   Ms. Rommel is thus demonstrating a dementia level problem with primary deficits on measures of processing speed, executive functioning, and naming and she has some additional visuospatial/constructional issues. 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 Her test data are weakly supportive although  her visuospatial functioning is less affected than might be expected. A mixed pathological picture is also possible, although her test data are equivocal for AD (has naming problems but no memory storage problem PREVIOUS MEDICATIONS:   CURRENT MEDICATIONS:  Outpatient Encounter Medications as of 11/10/2023  Medication Sig   ASPIRIN 81 PO Take 81 mg by mouth 2 (two) times a week.   atorvastatin  (LIPITOR) 10 MG tablet Take 1 tablet (10 mg total) by mouth daily.   benzonatate  (TESSALON  PERLES) 100 MG capsule Take 1 capsule (100 mg total) by mouth 3 (three) times daily as needed.   diclofenac  Sodium (VOLTAREN ) 1 % GEL Apply 2 g topically 4 (four) times daily.   divalproex  (DEPAKOTE ) 125 MG DR tablet Take 1 tablet (125 mg total) by mouth 2 (two) times daily.   glucose blood (ONETOUCH VERIO) test strip Use to test blood sugar three times daily. Dx: E11.40   insulin  isophane & regular human KwikPen (NOVOLIN  70/30 KWIKPEN) (70-30) 100 UNIT/ML KwikPen Inject 20 Units into the skin daily. If fasting or evening blood sugar is 85 or less, reduce units by 3. DX: E11.40   Insulin  Pen Needle 31G X 6 MM MISC Use for insulin  once daily. Dx: E11.40   Lancets (ONETOUCH ULTRASOFT) lancets Use to test blood sugar three time daily. Dx: E11.40   levocetirizine (XYZAL ) 5 MG tablet Take 0.5 tablets (2.5 mg total) by mouth every evening.   lisinopril -hydrochlorothiazide  (ZESTORETIC ) 10-12.5 MG tablet Take 1 tablet by mouth daily.   Nutritional Supplements (ENSURE NUTRITION SHAKE) LIQD Take 1 Can by mouth in the morning and at bedtime.   No facility-administered encounter medications on file as of 11/10/2023.       05/12/2023    5:00 PM 10/20/2022    3:00 PM 04/08/2022    1:00 PM  MMSE - Mini Mental State Exam  Orientation to time 3 4 5   Orientation to Place 2 5 5   Registration 3 3 3   Attention/ Calculation 5 5 5   Recall 3 1 3   Language- name 2 objects 2 2 2   Language- repeat 1 1 1   Language- follow 3 step  command 3 3 3   Language- read & follow direction 1 1 1   Write a sentence 1 1 1   Copy design 0 1 1  Total score 24 27 30       12/02/2014   10:00 AM  Montreal Cognitive Assessment   Visuospatial/ Executive (0/5) 3  Naming (0/3) 3  Attention: Read list of digits (0/2) 2  Attention: Read list of letters (0/1) 1  Attention:  Serial 7 subtraction starting at 100 (0/3) 3  Language: Repeat phrase (0/2) 2  Language : Fluency (0/1) 0  Abstraction (0/2) 2  Delayed Recall (0/5) 3  Orientation (0/6) 6  Total 25  Adjusted Score (based on education) 25    Objective:     PHYSICAL EXAMINATION:    VITALS:   Vitals:   11/10/23 1253  BP: (!) 147/68  Pulse: 71  SpO2: 98%  Weight: 121 lb (54.9 kg)  Height: 5' 1 (1.549 m)    GEN:  The patient appears stated age and is in NAD. HEENT:  Normocephalic, atraumatic.   Neurological examination:  General: NAD, well-groomed, appears stated age. Orientation: The patient is alert. Oriented to person, not to place and date Cranial nerves: There is good facial symmetry.The speech is fluent and clear. No aphasia or dysarthria. Fund of knowledge is reduced. Recent and remote memory are impaired. Attention and concentration are reduced. Able to name objects and repeat phrases.  Hearing is intact to conversational tone.  Sensation: Sensation is intact to light touch throughout Motor: Strength is at least antigravity x4. DTR's 2/4 in UE/LE     Movement examination: Tone: There is normal tone in the UE/LE Abnormal movements:  no tremor.  No myoclonus.  No asterixis.   Coordination:  There is no decremation with RAM's. Normal finger to nose  Gait and Station: The patient has some difficulty arising out of a deep-seated chair without the use of the hands. The patient's stride length is short.  Gait is cautious and narrow.    Thank you for allowing us  the opportunity to participate in the care of this nice patient. Please do not hesitate to contact us   for any questions or concerns.   Total time spent on today's visit was 35 minutes dedicated to this patient today, preparing to see patient, examining the patient, ordering tests and/or medications and counseling the patient, documenting clinical information in the EHR or other health record, independently interpreting results and communicating results to the patient/family, discussing treatment and goals, answering patient's questions and coordinating care.  Cc:  Deitra Morton Hummer, Nena, NP  Camie Sevin 11/10/2023 1:34 PM

## 2023-11-10 NOTE — Patient Instructions (Signed)
 It was a pleasure to see you today at our office.   Recommendations:  Follow up in  6 months Recommend Depakote  to 250 mg at night and 1capsule as needed during the day for agitation    ke, or other severe issues such as confusion,severe chills or fever, etc call 911 or go to the ER as you may need to be evaluate further        RECOMMENDATIONS FOR ALL PATIENTS WITH MEMORY PROBLEMS: 1. Continue to exercise (Recommend 30 minutes of walking everyday, or 3 hours every week) 2. Increase social interactions - continue going to [place] and enjoy social gatherings with friends and family 3. Eat healthy, avoid fried foods and eat more fruits and vegetables 4. Maintain adequate blood pressure, blood sugar, and blood cholesterol level. Reducing the risk of stroke and cardiovascular disease also helps promoting better memory. 5. Avoid stressful situations. Live a simple life and avoid aggravations. Organize your time and prepare for the next day in anticipation. 6. Sleep well, avoid any interruptions of sleep and avoid any distractions in the bedroom that may interfere with adequate sleep quality 7. Avoid sugar, avoid sweets as there is a strong link between excessive sugar intake, diabetes, and cognitive impairment We discussed the Mediterranean diet, which has been shown to help patients reduce the risk of progressive memory disorders and reduces cardiovascular risk. This includes eating fish, eat fruits and green leafy vegetables, nuts like almonds and hazelnuts, walnuts, and also use olive oil. Avoid fast foods and fried foods as much as possible. Avoid sweets and sugar as sugar use has been linked to worsening of memory function.  There is always a concern of gradual progression of memory problems. If this is the case, then we may need to adjust level of care according to patient needs. Support, both to the patient and caregiver, should then be put into place.    The [employer] is here  all day, every day for people facing Alzheimer's disease through our free 24/7 Helpline: [phone]. The Helpline provides reliable information and support to all those who need assistance, such as individuals living with memory loss, Alzheimer's or other dementia, caregivers, health care professionals and the public.  Our highly trained and knowledgeable staff can help you with: Understanding memory loss, dementia and Alzheimer's  Medications and other treatment options  General information about aging and brain health  Skills to provide quality care and to find the best care from professionals  Legal, financial and living-arrangement decisions Our Helpline also features: Confidential care consultation provided by master's level clinicians who can help with decision-making support, crisis assistance and education on issues families face every day  Help in a caller's preferred language using our translation service that features more than 200 languages and dialects  Referrals to local community programs, services and ongoing support     FALL PRECAUTIONS: Be cautious when walking. Scan the area for obstacles that may increase the risk of trips and falls. When getting up in the mornings, sit up at the edge of the bed for a few minutes before getting out of bed. Consider elevating the bed at the head end to avoid drop of blood pressure when getting up. Walk always in a well-lit room (use night lights in the walls). Avoid area rugs or power cords from appliances in the middle of the walkways. Use a walker or a cane if necessary and consider physical therapy for balance exercise. Get your eyesight checked regularly.  FINANCIAL OVERSIGHT:  Supervision, especially oversight when making financial decisions or transactions is also recommended.  HOME SAFETY: Consider the safety of the kitchen when operating appliances like stoves, microwave oven, and blender. Consider having supervision and share cooking  responsibilities until no longer able to participate in those. Accidents with firearms and other hazards in the house should be identified and addressed as well.   ABILITY TO BE LEFT ALONE: If patient is unable to contact 911 operator, consider using LifeLine, or when the need is there, arrange for someone to stay with patients. Smoking is a fire hazard, consider supervision or cessation. Risk of wandering should be assessed by caregiver and if detected at any point, supervision and safe proof recommendations should be instituted.  MEDICATION SUPERVISION: Inability to self-administer medication needs to be constantly addressed. Implement a mechanism to ensure safe administration of the medications.   DRIVING: Regarding driving, in patients with progressive memory problems, driving will be impaired. We advise to have someone else do the driving if trouble finding directions or if minor accidents are reported. Independent driving assessment is available to determine safety of driving.   If you are interested in the driving assessment, you can contact the following:  [facility] [employer] in [place] [phone]  [facility] [phone]  [facility] [phone] or [phone]      Mediterranean Diet A Mediterranean diet refers to food and lifestyle choices that are based on the traditions of countries located on the [employer]. This way of eating has been shown to help prevent certain conditions and improve outcomes for people who have chronic diseases, like kidney disease and heart disease. What are tips for following this plan? Lifestyle  Cook and eat meals together with your family, when possible. Drink enough fluid to keep your urine clear or pale yellow. Be physically active every day. This includes: Aerobic exercise like running or swimming. Leisure activities like gardening, walking, or housework. Get 7-8  hours of sleep each night. If recommended by your health care provider, drink red wine in moderation. This means 1 glass a day for nonpregnant women and 2 glasses a day for men. A glass of wine equals 5 oz (150 mL). Reading food labels  Check the serving size of packaged foods. For foods such as rice and pasta, the serving size refers to the amount of cooked product, not dry. Check the total fat in packaged foods. Avoid foods that have saturated fat or trans fats. Check the ingredients list for added sugars, such as corn syrup. Shopping  At the grocery store, buy most of your food from the areas near the walls of the store. This includes: Fresh fruits and vegetables (produce). Grains, beans, nuts, and seeds. Some of these may be available in unpackaged forms or large amounts (in bulk). Fresh seafood. Poultry and eggs. Low-fat dairy products. Buy whole ingredients instead of prepackaged foods. Buy fresh fruits and vegetables in-season from local farmers markets. Buy frozen fruits and vegetables in resealable bags. If you do not have access to quality fresh seafood, buy precooked frozen shrimp or canned fish, such as tuna, salmon, or sardines. Buy small amounts of raw or cooked vegetables, salads, or olives from the deli or salad bar at your store. Stock your pantry so you always have certain foods on hand, such as olive oil, canned tuna, canned tomatoes, rice, pasta, and beans. Cooking  Cook foods with extra-virgin olive oil instead of using butter or other vegetable oils. Have meat  as a side dish, and have vegetables or grains as your main dish. This means having meat in small portions or adding small amounts of meat to foods like pasta or stew. Use beans or vegetables instead of meat in common dishes like chili or lasagna. Experiment with different cooking methods. Try roasting or broiling vegetables instead of steaming or sauteing them. Add frozen vegetables to soups, stews, pasta, or  rice. Add nuts or seeds for added healthy fat at each meal. You can add these to yogurt, salads, or vegetable dishes. Marinate fish or vegetables using olive oil, lemon juice, garlic, and fresh herbs. Meal planning  Plan to eat 1 vegetarian meal one day each week. Try to work up to 2 vegetarian meals, if possible. Eat seafood 2 or more times a week. Have healthy snacks readily available, such as: Vegetable sticks with hummus. Greek yogurt. Fruit and nut trail mix. Eat balanced meals throughout the week. This includes: Fruit: 2-3 servings a day Vegetables: 4-5 servings a day Low-fat dairy: 2 servings a day Fish, poultry, or lean meat: 1 serving a day Beans and legumes: 2 or more servings a week Nuts and seeds: 1-2 servings a day Whole grains: 6-8 servings a day Extra-virgin olive oil: 3-4 servings a day Limit red meat and sweets to only a few servings a month What are my food choices? Mediterranean diet Recommended Grains: Whole-grain pasta. Brown rice. Bulgar wheat. Polenta. Couscous. Whole-wheat bread. Dwyane Glad. Vegetables: Artichokes. Beets. Broccoli. Cabbage. Carrots. Eggplant. Green beans. Chard. Kale. Spinach. Onions. Leeks. Peas. Squash. Tomatoes. Peppers. Radishes. Fruits: Apples. Apricots. Avocado. Berries. Bananas. Cherries. Dates. Figs. Grapes. Lemons. Melon. Oranges. Peaches. Plums. Pomegranate. Meats and other protein foods: Beans. Almonds. Sunflower seeds. Pine nuts. Peanuts. Cod. Salmon. Scallops. Shrimp. Tuna. Tilapia. Clams. Oysters. Eggs. Dairy: Low-fat milk. Cheese. Greek yogurt. Beverages: Water. Red wine. Herbal tea. Fats and oils: Extra virgin olive oil. Avocado oil. Grape seed oil. Sweets and desserts: [place] yogurt with honey. Baked apples. Poached pears. Trail mix. Seasoning and other foods: Basil. Cilantro. Coriander. Cumin. Mint. Parsley. Sage. Rosemary. Tarragon. Garlic. Oregano. Thyme. Pepper. Balsalmic vinegar. Tahini. Hummus. Tomato sauce. Olives.  Mushrooms. Limit these Grains: Prepackaged pasta or rice dishes. Prepackaged cereal with added sugar. Vegetables: Deep fried potatoes (french fries). Fruits: Fruit canned in syrup. Meats and other protein foods: Beef. Pork. Lamb. Poultry with skin. Hot dogs. Helene Loader. Dairy: Ice cream. Sour cream. Whole milk. Beverages: Juice. Sugar-sweetened soft drinks. Beer. Liquor and spirits. Fats and oils: Butter. Canola oil. Vegetable oil. Beef fat (tallow). Lard. Sweets and desserts: Cookies. Cakes. Pies. Candy. Seasoning and other foods: Mayonnaise. Premade sauces and marinades. The items listed may not be a complete list. Talk with your dietitian about what dietary choices are right for you. Summary The Mediterranean diet includes both food and lifestyle choices. Eat a variety of fresh fruits and vegetables, beans, nuts, seeds, and whole grains. Limit the amount of red meat and sweets that you eat. Talk with your health care provider about whether it is safe for you to drink red wine in moderation. This means 1 glass a day for nonpregnant women and 2 glasses a day for men. A glass of wine equals 5 oz (150 mL). This information is not intended to replace advice given to you by your health care provider. Make sure you discuss any questions you have with your health care provider. Document Released: 12/31/2015 Document Revised: 02/02/2016 Document Reviewed: 12/31/2015 Elsevier Interactive Patient Education  2017 [employer].

## 2023-11-13 ENCOUNTER — Ambulatory Visit: Payer: Medicare HMO | Admitting: Physician Assistant

## 2023-11-16 ENCOUNTER — Encounter: Payer: Self-pay | Admitting: Nurse Practitioner

## 2023-11-16 ENCOUNTER — Ambulatory Visit (INDEPENDENT_AMBULATORY_CARE_PROVIDER_SITE_OTHER): Admitting: Nurse Practitioner

## 2023-11-16 VITALS — BP 130/70 | HR 59 | Temp 97.6°F | Ht 61.0 in | Wt 125.6 lb

## 2023-11-16 DIAGNOSIS — J301 Allergic rhinitis due to pollen: Secondary | ICD-10-CM | POA: Diagnosis not present

## 2023-11-16 DIAGNOSIS — E1140 Type 2 diabetes mellitus with diabetic neuropathy, unspecified: Secondary | ICD-10-CM

## 2023-11-16 DIAGNOSIS — I10 Essential (primary) hypertension: Secondary | ICD-10-CM

## 2023-11-16 DIAGNOSIS — Z794 Long term (current) use of insulin: Secondary | ICD-10-CM | POA: Diagnosis not present

## 2023-11-16 DIAGNOSIS — F02818 Dementia in other diseases classified elsewhere, unspecified severity, with other behavioral disturbance: Secondary | ICD-10-CM

## 2023-11-16 DIAGNOSIS — E782 Mixed hyperlipidemia: Secondary | ICD-10-CM

## 2023-11-16 DIAGNOSIS — G3183 Dementia with Lewy bodies: Secondary | ICD-10-CM

## 2023-11-16 DIAGNOSIS — R4189 Other symptoms and signs involving cognitive functions and awareness: Secondary | ICD-10-CM

## 2023-11-16 NOTE — Progress Notes (Signed)
 Home health order history of present  Established Patient Office Visit  Subjective  Patient ID: Robyn [name], female    DOB: Jul 23, 1934  Age: 89 y.o. MRN: [id number]  Chief Complaint  Patient presents with   Medical Management of Chronic Issues    3 month     HPI Robyn [name] is 89 yrs old female presents with her daughter for chronic diseases management  Dementia patient is an 89 year old female with a known diagnosis of dementia. According to her daughter, the patient was evaluated by a neurologist last week, who noted some neurologic decline. The daughter reports that the patient is no longer able to remain at home alone and may require a higher level of care, such as a memory care facility or home health services. She is currently in the process of contacting the insurance provider to determine eligibility for home health support.   Diabetes Mellitus Type II, Follow-up  Lab Results  Component Value Date   HGBA1C 6.9 (H) 08/15/2023   HGBA1C 6.4 (H) 05/09/2023   HGBA1C 7.0 (H) 09/07/2022   Wt Readings from Last 3 Encounters:  11/16/23 125 lb 9.6 oz (57 kg)  11/10/23 121 lb (54.9 kg)  10/17/23 125 lb 6.4 oz (56.9 kg)   Last seen for diabetes 3 months ago.  Management since then includes insulin . She reports excellent compliance with treatment. She is not having side effects.  Symptoms: No fatigue No foot ulcerations  No appetite changes No nausea  No paresthesia of the feet  No polydipsia  No polyuria No visual disturbances   No vomiting     Home blood sugar records: 110  Episodes of hypoglycemia? No    Current insulin  regiment: Novolog  Pen  Current exercise: none Current diet habits: well balanced  Pertinent Labs: Lab Results  Component Value Date   CHOL 181 08/15/2023   HDL 89 08/15/2023   LDLCALC 78 08/15/2023   TRIG 78 08/15/2023   CHOLHDL 2.0 08/15/2023   Lab Results  Component Value Date   NA 143 08/15/2023   K 4.4 08/15/2023    CREATININE 0.94 08/15/2023   EGFR 59 (L) 08/15/2023      Lipid/Cholesterol, Follow-up  Last lipid panel Other pertinent labs  Lab Results  Component Value Date   CHOL 181 08/15/2023   HDL 89 08/15/2023   LDLCALC 78 08/15/2023   TRIG 78 08/15/2023   CHOLHDL 2.0 08/15/2023   Lab Results  Component Value Date   ALT 19 08/15/2023   AST 26 08/15/2023   PLT 218 08/15/2023     She was last seen for this 3 months ago.  Management since that visit includes Lipitor 10 mg daily.  She reports excellent compliance with treatment. She is not having side effects.   Symptoms: No chest pain No chest pressure/discomfort  No dyspnea No lower extremity edema  No numbness or tingling of extremity No orthopnea  No palpitations No paroxysmal nocturnal dyspnea  No speech difficulty No syncope   Current diet: well balanced Current exercise: none  The ASCVD Risk score (Arnett DK, et al., 2019) failed to calculate for the following reasons:   The 2019 ASCVD risk score is only valid for ages 10 to 51   Risk score cannot be calculated because patient has a medical history suggesting prior/existing ASCVD  Hypertension, follow-up  BP Readings from Last 3 Encounters:  11/16/23 130/70  11/10/23 (!) 147/68  10/17/23 (!) 152/71   Wt Readings from Last 3 Encounters:  11/16/23  125 lb 9.6 oz (57 kg)  11/10/23 121 lb (54.9 kg)  10/17/23 125 lb 6.4 oz (56.9 kg)     She was last seen for hypertension 3 months ago.  BP at that visit was 147/68. Management since that visit includes Zestoretic  10-12.5 mg daily.  She reports excellent compliance with treatment. She is not having side effects.  She is following a Regular diet. She is not exercising. She does not smoke. Use of agents associated with hypertension: none.   Outside blood pressures are not being monitored. Symptoms: No chest pain No chest pressure  No palpitations No syncope  No dyspnea No orthopnea  No paroxysmal nocturnal dyspnea  No lower extremity edema   Pertinent labs Lab Results  Component Value Date   CHOL 181 08/15/2023   HDL 89 08/15/2023   LDLCALC 78 08/15/2023   TRIG 78 08/15/2023   CHOLHDL 2.0 08/15/2023   Lab Results  Component Value Date   NA 143 08/15/2023   K 4.4 08/15/2023   CREATININE 0.94 08/15/2023   EGFR 59 (L) 08/15/2023   GLUCOSE 61 (L) 08/15/2023     The ASCVD Risk score (Arnett DK, et al., 2019) failed to calculate for the following reasons:   The 2019 ASCVD risk score is only valid for ages 14 to 47   Risk score cannot be calculated because patient has a medical history suggesting prior/existing ASCVD  Allergic Rhinitis: Robyn [name] is here for evaluation of possible allergic rhinitis. Patient's symptoms include itchy nose and nasal congestion. These symptoms are perennial with seasonal exacerbation. Current triggers include exposure to no known precipitant. She has been on Xyzal  2.5 mg daily The patient has tried prescription antihistamines with excellent relief of symptoms. Immunotherapy has never been tried. The patient has never had nasal polyps. The patient has no history of asthma. The patient does not suffer from frequent sinopulmonary infections. The patient has not had sinus surgery in the past. The patient has no history of eczema.  Went to eat and always just MS[employer] if I handed over to home my biggest problem  Patient Active Problem List   Diagnosis Date Noted   Cognitive decline 11/16/2023   Encounter for general adult medical examination with abnormal findings 08/15/2023   Mixed hyperlipidemia 08/15/2023   Diabetic neuropathy (HCC) 11/01/2022   Bed sore on heel 11/17/2021   Type 2 diabetes mellitus with diabetic neuropathy, unspecified (HCC) 04/27/2021   Heart disease 11/18/2020   Vitamin D deficiency 11/18/2020   Dementia, Lewy body with behavior disturbance (HCC) 11/05/2020   Mild cognitive impairment 12/02/2014   HYPERLIPIDEMIA 07/16/2008   Anxiety state  07/16/2008   Phobia 07/16/2008   Unspecified glaucoma 07/16/2008   Essential hypertension 07/16/2008   CORONARY ARTERY DISEASE, S/P PTCA 07/16/2008   ALLERGIC RHINITIS, SEASONAL 07/16/2008   Osteoarthritis 07/16/2008   HALLUCINATION 07/16/2008   Insomnia 07/16/2008   WEIGHT LOSS, ABNORMAL 07/16/2008   DYSPNEA 07/16/2008   PERSONAL HX TIA & CI W/O RESIDUAL DEFICITS 07/16/2008   Past Medical History:  Diagnosis Date   Coronary artery disease    Dementia (HCC)    Diabetes mellitus    Glaucoma    Hallucination    Heart disease    History of heart artery stent 2009   Hypertension    Memory loss    just started on aricept    Mild cognitive impairment    Osteoarthritis    Phobia    Psychosis (HCC)    Stroke (HCC)  Vision abnormalities    Past Surgical History:  Procedure Laterality Date   CORONARY ANGIOPLASTY WITH STENT PLACEMENT     CORONARY STENT PLACEMENT     Social History   Tobacco Use   Smoking status: Former    Current packs/day: 0.00    Types: Cigarettes    Quit date: 10/08/1963    Years since quitting: 60.1   Smokeless tobacco: Never  Vaping Use   Vaping status: Never Used  Substance Use Topics   Alcohol use: No   Drug use: No   Social History   Socioeconomic History   Marital status: Widowed    Spouse name: Not on file   Number of children: Not on file   Years of education: 10   Highest education level: 10th grade  Occupational History   Occupation: retired  Tobacco Use   Smoking status: Former    Current packs/day: 0.00    Types: Cigarettes    Quit date: 10/08/1963    Years since quitting: 60.1   Smokeless tobacco: Never  Vaping Use   Vaping status: Never Used  Substance and Sexual Activity   Alcohol use: No   Drug use: No   Sexual activity: Not on file  Other Topics Concern   Not on file  Social History Narrative   Has a legal guardian    Diet:      Caffeine:      Married, if yes what year:       Do you live in a house,  apartment, assisted living, condo, trailer, ect: Apartment      Is it one or more stories: one      How many persons live in your home? Self      Pets: No      Highest level or education completed: 10 th      Current/Past profession: Museum/gallery curator      Exercise:                  Type and how often:          Living Will: No   DNR: No   POA/HPOA: No      Functional Status:   Do you have difficulty bathing or dressing yourself? No   Do you have difficulty preparing food or eating?   Do you have difficulty managing your medications?   Do you have difficulty managing your finances?   Do you have difficulty affording your medications? No      Right handed       Social Drivers of Health   Financial Resource Strain: Low Risk  (11/16/2023)   Overall Financial Resource Strain (CARDIA)    Difficulty of Paying Living Expenses: Not hard at all  Food Insecurity: No Food Insecurity (11/16/2023)   Hunger Vital Sign    Worried About Running Out of Food in the Last Year: Never true    Ran Out of Food in the Last Year: Never true  Transportation Needs: No Transportation Needs (11/16/2023)   PRAPARE - Administrator, Civil Service (Medical): No    Lack of Transportation (Non-Medical): No  Physical Activity: Unknown (11/16/2023)   Exercise Vital Sign    Days of Exercise per Week: Patient declined    Minutes of Exercise per Session: Not on file  Recent Concern: Physical Activity - Insufficiently Active (10/09/2023)   Exercise Vital Sign    Days of Exercise per Week: 2 days    Minutes of Exercise per  Session: 10 min  Stress: No Stress Concern Present (11/16/2023)   [employer] of Occupational Health - Occupational Stress Questionnaire    Feeling of Stress: Only a little  Social Connections: Unknown (11/16/2023)   Social Connection and Isolation Panel    Frequency of Communication with Friends and Family: Never    Frequency of Social Gatherings with Friends and Family:  Never    Attends Religious Services: Never    Database administrator or Organizations: Patient declined    Attends Banker Meetings: Not on file    Marital Status: Widowed  Recent Concern: Social Connections - Socially Isolated (10/09/2023)   Social Connection and Isolation Panel    Frequency of Communication with Friends and Family: More than three times a week    Frequency of Social Gatherings with Friends and Family: Once a week    Attends Religious Services: Never    Database administrator or Organizations: No    Attends Banker Meetings: Never    Marital Status: Widowed  Intimate Partner Violence: Not At Risk (10/09/2023)   Humiliation, Afraid, Rape, and Kick questionnaire    Fear of Current or Ex-Partner: No    Emotionally Abused: No    Physically Abused: No    Sexually Abused: No   Family Status  Relation Name Status   Mother  Deceased   Father  Deceased   Sister Maurilio Gearing Deceased   Sister Mattie Hairstson Deceased   Sister Ronal Gearing Alive   Brother Comptroller Alive   Brother [place] Alive   Brother [place] Alive   Brother [place]adie [place] Deceased   Daughter Robyn [name] Alive   Daughter Robyn [name] Alive   Son Robyn [name] Alive   G Son  Alive   G Daughter  Alive  No partnership data on file   Family History  Problem Relation Age of Onset   Hypertension Mother    Diabetes Mother    Hypertension Father    Diabetes Father    Hypertension Sister    Diabetes Sister    Kidney disease Sister    Liver disease Brother    Hypertension Daughter    Fibromyalgia Daughter    Rheum arthritis Daughter    Breast cancer Daughter    Hypertension Son    Seizures Granddaughter    Epilepsy Granddaughter    No Known Allergies    ROS Negative unless indicated in HPI   Objective:     BP 130/70   Pulse (!) 59   Temp 97.6 F (36.4 C) (Temporal)   Ht 5' 1 (1.549 m)   Wt 125 lb 9.6 oz (57 kg)   SpO2 97%   BMI 23.73 kg/m  BP Readings  from Last 3 Encounters:  11/16/23 130/70  11/10/23 (!) 147/68  10/17/23 (!) 152/71   Wt Readings from Last 3 Encounters:  11/16/23 125 lb 9.6 oz (57 kg)  11/10/23 121 lb (54.9 kg)  10/17/23 125 lb 6.4 oz (56.9 kg)      Physical Exam Vitals and nursing note reviewed.  Constitutional:      General: She is not in acute distress. HENT:     Head: Normocephalic and atraumatic.     Nose: Nose normal.     Mouth/Throat:     Mouth: Mucous membranes are moist.   Eyes:     General: No scleral icterus.    Extraocular Movements: Extraocular movements intact.     Conjunctiva/sclera: Conjunctivae normal.  Pupils: Pupils are equal, round, and reactive to light.    Cardiovascular:     Heart sounds: Normal heart sounds.  Pulmonary:     Effort: Pulmonary effort is normal.     Breath sounds: Normal breath sounds.   Musculoskeletal:        General: Normal range of motion.     Right lower leg: No edema.     Left lower leg: No edema.   Skin:    General: Skin is warm and dry.   Neurological:     Mental Status: She is alert. Mental status is at baseline.     Gait: Gait abnormal.   Psychiatric:        Mood and Affect: Mood normal.        Behavior: Behavior normal.        Thought Content: Thought content normal.        Judgment: Judgment normal.      No results found for any visits on 11/16/23.  Last CBC Lab Results  Component Value Date   WBC 6.7 08/15/2023   HGB 12.8 08/15/2023   HCT 38.4 08/15/2023   MCV 90 08/15/2023   MCH 30.1 08/15/2023   RDW 12.3 08/15/2023   PLT 218 08/15/2023   Last metabolic panel Lab Results  Component Value Date   GLUCOSE 61 (L) 08/15/2023   NA 143 08/15/2023   K 4.4 08/15/2023   CL 103 08/15/2023   CO2 26 08/15/2023   BUN 16 08/15/2023   CREATININE 0.94 08/15/2023   EGFR 59 (L) 08/15/2023   CALCIUM  9.7 08/15/2023   PROT 7.3 08/15/2023   ALBUMIN 4.0 08/15/2023   LABGLOB 3.3 08/15/2023   BILITOT 0.3 08/15/2023   ALKPHOS 70  08/15/2023   AST 26 08/15/2023   ALT 19 08/15/2023   ANIONGAP 10 04/04/2023   Last lipids Lab Results  Component Value Date   CHOL 181 08/15/2023   HDL 89 08/15/2023   LDLCALC 78 08/15/2023   TRIG 78 08/15/2023   CHOLHDL 2.0 08/15/2023   Last hemoglobin A1c Lab Results  Component Value Date   HGBA1C 6.9 (H) 08/15/2023   Last thyroid  functions Lab Results  Component Value Date   T4TOTAL 7.0 08/15/2023        Assessment & Plan:  Type 2 diabetes mellitus with diabetic neuropathy, with long-term current use of insulin  (HCC) -     Bayer DCA Hb A1c Waived  Essential hypertension  Seasonal allergic rhinitis due to pollen  Mixed hyperlipidemia  Dementia, Lewy body with behavior disturbance (HCC) -     Ambulatory referral to Home Health  Cognitive decline -     Ambulatory referral to Home Health  Robyn [name] is a 89 year old Caucasian African-American female seen today for chronic disease management, no acute distress Diabetes: A1c ordered Dementia/cognitive decline:referred for home health Continue taking all previously prescribed medication no refill needed today   The above assessment and management plan was discussed with the patient. The patient verbalized understanding of and has agreed to the management plan. Patient is aware to call the clinic if they develop any new symptoms or if symptoms persist or worsen. Patient is aware when to return to the clinic for a follow-up visit. Patient educated on when it is appropriate to go to the emergency department.  Return in about 3 months (around 02/16/2024) for chronic Diseases Management.    Miko Markwood [place] Thompson, DNP [facility] [address] [place], [place] [address] [phone]  Note: This document was prepared by [employer] voice dictation technology and any errors that results from this process are unintentional.

## 2023-11-17 LAB — BAYER DCA HB A1C WAIVED: HB A1C (BAYER DCA - WAIVED): 9.1 % — ABNORMAL HIGH (ref 4.8–5.6)

## 2023-11-20 ENCOUNTER — Ambulatory Visit: Payer: Self-pay | Admitting: Nurse Practitioner

## 2023-11-30 ENCOUNTER — Other Ambulatory Visit: Payer: Self-pay | Admitting: Nurse Practitioner

## 2023-11-30 DIAGNOSIS — M159 Polyosteoarthritis, unspecified: Secondary | ICD-10-CM

## 2023-11-30 DIAGNOSIS — F02818 Dementia in other diseases classified elsewhere, unspecified severity, with other behavioral disturbance: Secondary | ICD-10-CM

## 2023-11-30 DIAGNOSIS — R4189 Other symptoms and signs involving cognitive functions and awareness: Secondary | ICD-10-CM

## 2023-11-30 DIAGNOSIS — G3184 Mild cognitive impairment, so stated: Secondary | ICD-10-CM

## 2023-12-14 ENCOUNTER — Other Ambulatory Visit: Payer: Self-pay | Admitting: Nurse Practitioner

## 2023-12-14 ENCOUNTER — Telehealth: Payer: Self-pay | Admitting: *Deleted

## 2023-12-14 DIAGNOSIS — E1140 Type 2 diabetes mellitus with diabetic neuropathy, unspecified: Secondary | ICD-10-CM

## 2023-12-14 MED ORDER — HUMULIN 70/30 KWIKPEN (70-30) 100 UNIT/ML ~~LOC~~ SUPN: PEN_INJECTOR | SUBCUTANEOUS | 11 refills | Status: AC

## 2023-12-14 NOTE — Telephone Encounter (Signed)
 Fax from [facility] pharmacy RE: Novolin  70/30 Flexpen Note from pharmacy: Ins prefers Humulin  70/30, can this be changed Please advise

## 2023-12-14 NOTE — Progress Notes (Signed)
:   Pharmacy insurance will not cover Novolin  70/30 and requested for client to be changed to Humalog 70/30

## 2023-12-19 DIAGNOSIS — E559 Vitamin D deficiency, unspecified: Secondary | ICD-10-CM | POA: Diagnosis not present

## 2023-12-19 DIAGNOSIS — E1140 Type 2 diabetes mellitus with diabetic neuropathy, unspecified: Secondary | ICD-10-CM | POA: Diagnosis not present

## 2023-12-19 DIAGNOSIS — Z556 Problems related to health literacy: Secondary | ICD-10-CM | POA: Diagnosis not present

## 2023-12-19 DIAGNOSIS — M1990 Unspecified osteoarthritis, unspecified site: Secondary | ICD-10-CM | POA: Diagnosis not present

## 2023-12-19 DIAGNOSIS — Z87891 Personal history of nicotine dependence: Secondary | ICD-10-CM | POA: Diagnosis not present

## 2023-12-19 DIAGNOSIS — J302 Other seasonal allergic rhinitis: Secondary | ICD-10-CM | POA: Diagnosis not present

## 2023-12-19 DIAGNOSIS — I2510 Atherosclerotic heart disease of native coronary artery without angina pectoris: Secondary | ICD-10-CM | POA: Diagnosis not present

## 2023-12-19 DIAGNOSIS — Z794 Long term (current) use of insulin: Secondary | ICD-10-CM | POA: Diagnosis not present

## 2023-12-19 DIAGNOSIS — Z7982 Long term (current) use of aspirin: Secondary | ICD-10-CM | POA: Diagnosis not present

## 2023-12-19 DIAGNOSIS — R634 Abnormal weight loss: Secondary | ICD-10-CM | POA: Diagnosis not present

## 2023-12-19 DIAGNOSIS — Z8673 Personal history of transient ischemic attack (TIA), and cerebral infarction without residual deficits: Secondary | ICD-10-CM | POA: Diagnosis not present

## 2023-12-19 DIAGNOSIS — Z955 Presence of coronary angioplasty implant and graft: Secondary | ICD-10-CM | POA: Diagnosis not present

## 2023-12-19 DIAGNOSIS — G4700 Insomnia, unspecified: Secondary | ICD-10-CM | POA: Diagnosis not present

## 2023-12-19 DIAGNOSIS — I119 Hypertensive heart disease without heart failure: Secondary | ICD-10-CM | POA: Diagnosis not present

## 2023-12-19 DIAGNOSIS — H409 Unspecified glaucoma: Secondary | ICD-10-CM | POA: Diagnosis not present

## 2023-12-19 DIAGNOSIS — E782 Mixed hyperlipidemia: Secondary | ICD-10-CM | POA: Diagnosis not present

## 2023-12-19 DIAGNOSIS — Z604 Social exclusion and rejection: Secondary | ICD-10-CM | POA: Diagnosis not present

## 2023-12-20 ENCOUNTER — Telehealth: Payer: Self-pay | Admitting: Family Medicine

## 2023-12-20 NOTE — Telephone Encounter (Signed)
 Verbal given

## 2023-12-20 NOTE — Telephone Encounter (Signed)
 Copied from CRM [id number]. Topic: Clinical - Home Health Verbal Orders >> Dec 20, 2023 10:04 AM Richerd NOVAK wrote: Caller/Agency: Nena from [facility] Callback Number: [phone]  Service Requested: Skilled Nursing Frequency: 1x3  and every other week for 2 weeks Any new concerns about the patient? no

## 2023-12-21 ENCOUNTER — Telehealth: Payer: Self-pay

## 2023-12-21 DIAGNOSIS — E782 Mixed hyperlipidemia: Secondary | ICD-10-CM | POA: Diagnosis not present

## 2023-12-21 DIAGNOSIS — J302 Other seasonal allergic rhinitis: Secondary | ICD-10-CM | POA: Diagnosis not present

## 2023-12-21 DIAGNOSIS — I2510 Atherosclerotic heart disease of native coronary artery without angina pectoris: Secondary | ICD-10-CM | POA: Diagnosis not present

## 2023-12-21 DIAGNOSIS — G4700 Insomnia, unspecified: Secondary | ICD-10-CM | POA: Diagnosis not present

## 2023-12-21 DIAGNOSIS — Z794 Long term (current) use of insulin: Secondary | ICD-10-CM | POA: Diagnosis not present

## 2023-12-21 DIAGNOSIS — Z8673 Personal history of transient ischemic attack (TIA), and cerebral infarction without residual deficits: Secondary | ICD-10-CM | POA: Diagnosis not present

## 2023-12-21 DIAGNOSIS — Z87891 Personal history of nicotine dependence: Secondary | ICD-10-CM | POA: Diagnosis not present

## 2023-12-21 DIAGNOSIS — Z556 Problems related to health literacy: Secondary | ICD-10-CM | POA: Diagnosis not present

## 2023-12-21 DIAGNOSIS — H409 Unspecified glaucoma: Secondary | ICD-10-CM | POA: Diagnosis not present

## 2023-12-21 DIAGNOSIS — I119 Hypertensive heart disease without heart failure: Secondary | ICD-10-CM | POA: Diagnosis not present

## 2023-12-21 DIAGNOSIS — R634 Abnormal weight loss: Secondary | ICD-10-CM | POA: Diagnosis not present

## 2023-12-21 DIAGNOSIS — M1990 Unspecified osteoarthritis, unspecified site: Secondary | ICD-10-CM | POA: Diagnosis not present

## 2023-12-21 DIAGNOSIS — Z7982 Long term (current) use of aspirin: Secondary | ICD-10-CM | POA: Diagnosis not present

## 2023-12-21 DIAGNOSIS — Z955 Presence of coronary angioplasty implant and graft: Secondary | ICD-10-CM | POA: Diagnosis not present

## 2023-12-21 DIAGNOSIS — E1140 Type 2 diabetes mellitus with diabetic neuropathy, unspecified: Secondary | ICD-10-CM | POA: Diagnosis not present

## 2023-12-21 DIAGNOSIS — Z604 Social exclusion and rejection: Secondary | ICD-10-CM | POA: Diagnosis not present

## 2023-12-21 DIAGNOSIS — E559 Vitamin D deficiency, unspecified: Secondary | ICD-10-CM | POA: Diagnosis not present

## 2023-12-21 NOTE — Telephone Encounter (Signed)
 Left message for VO for PT.

## 2023-12-21 NOTE — Telephone Encounter (Signed)
 Copied from CRM [id number]. Topic: Clinical - Home Health Verbal Orders >> Dec 21, 2023  1:07 PM Rosaria BRAVO wrote: Caller/Agency: Harlene Gaba  Callback Number: [phone] VM secure, please leave a message Service Requested: Physical Therapy Frequency:  1w1 2w2 1w5 Any new concerns about the patient? No

## 2023-12-26 DIAGNOSIS — E559 Vitamin D deficiency, unspecified: Secondary | ICD-10-CM | POA: Diagnosis not present

## 2023-12-26 DIAGNOSIS — Z87891 Personal history of nicotine dependence: Secondary | ICD-10-CM | POA: Diagnosis not present

## 2023-12-26 DIAGNOSIS — H409 Unspecified glaucoma: Secondary | ICD-10-CM | POA: Diagnosis not present

## 2023-12-26 DIAGNOSIS — R634 Abnormal weight loss: Secondary | ICD-10-CM | POA: Diagnosis not present

## 2023-12-26 DIAGNOSIS — M1990 Unspecified osteoarthritis, unspecified site: Secondary | ICD-10-CM | POA: Diagnosis not present

## 2023-12-26 DIAGNOSIS — Z604 Social exclusion and rejection: Secondary | ICD-10-CM | POA: Diagnosis not present

## 2023-12-26 DIAGNOSIS — I119 Hypertensive heart disease without heart failure: Secondary | ICD-10-CM | POA: Diagnosis not present

## 2023-12-26 DIAGNOSIS — Z794 Long term (current) use of insulin: Secondary | ICD-10-CM | POA: Diagnosis not present

## 2023-12-26 DIAGNOSIS — E1140 Type 2 diabetes mellitus with diabetic neuropathy, unspecified: Secondary | ICD-10-CM | POA: Diagnosis not present

## 2023-12-26 DIAGNOSIS — Z7982 Long term (current) use of aspirin: Secondary | ICD-10-CM | POA: Diagnosis not present

## 2023-12-26 DIAGNOSIS — Z556 Problems related to health literacy: Secondary | ICD-10-CM | POA: Diagnosis not present

## 2023-12-26 DIAGNOSIS — J302 Other seasonal allergic rhinitis: Secondary | ICD-10-CM | POA: Diagnosis not present

## 2023-12-26 DIAGNOSIS — Z8673 Personal history of transient ischemic attack (TIA), and cerebral infarction without residual deficits: Secondary | ICD-10-CM | POA: Diagnosis not present

## 2023-12-26 DIAGNOSIS — G4700 Insomnia, unspecified: Secondary | ICD-10-CM | POA: Diagnosis not present

## 2023-12-26 DIAGNOSIS — E782 Mixed hyperlipidemia: Secondary | ICD-10-CM | POA: Diagnosis not present

## 2023-12-26 DIAGNOSIS — Z955 Presence of coronary angioplasty implant and graft: Secondary | ICD-10-CM | POA: Diagnosis not present

## 2023-12-26 DIAGNOSIS — I2510 Atherosclerotic heart disease of native coronary artery without angina pectoris: Secondary | ICD-10-CM | POA: Diagnosis not present

## 2023-12-27 ENCOUNTER — Ambulatory Visit: Admitting: Physician Assistant

## 2023-12-27 DIAGNOSIS — R634 Abnormal weight loss: Secondary | ICD-10-CM | POA: Diagnosis not present

## 2023-12-27 DIAGNOSIS — E782 Mixed hyperlipidemia: Secondary | ICD-10-CM | POA: Diagnosis not present

## 2023-12-27 DIAGNOSIS — Z8673 Personal history of transient ischemic attack (TIA), and cerebral infarction without residual deficits: Secondary | ICD-10-CM | POA: Diagnosis not present

## 2023-12-27 DIAGNOSIS — H409 Unspecified glaucoma: Secondary | ICD-10-CM | POA: Diagnosis not present

## 2023-12-27 DIAGNOSIS — I2510 Atherosclerotic heart disease of native coronary artery without angina pectoris: Secondary | ICD-10-CM | POA: Diagnosis not present

## 2023-12-27 DIAGNOSIS — I119 Hypertensive heart disease without heart failure: Secondary | ICD-10-CM | POA: Diagnosis not present

## 2023-12-27 DIAGNOSIS — J302 Other seasonal allergic rhinitis: Secondary | ICD-10-CM | POA: Diagnosis not present

## 2023-12-27 DIAGNOSIS — Z7982 Long term (current) use of aspirin: Secondary | ICD-10-CM | POA: Diagnosis not present

## 2023-12-27 DIAGNOSIS — Z604 Social exclusion and rejection: Secondary | ICD-10-CM | POA: Diagnosis not present

## 2023-12-27 DIAGNOSIS — Z955 Presence of coronary angioplasty implant and graft: Secondary | ICD-10-CM | POA: Diagnosis not present

## 2023-12-27 DIAGNOSIS — Z556 Problems related to health literacy: Secondary | ICD-10-CM | POA: Diagnosis not present

## 2023-12-27 DIAGNOSIS — E1140 Type 2 diabetes mellitus with diabetic neuropathy, unspecified: Secondary | ICD-10-CM | POA: Diagnosis not present

## 2023-12-27 DIAGNOSIS — G4700 Insomnia, unspecified: Secondary | ICD-10-CM | POA: Diagnosis not present

## 2023-12-27 DIAGNOSIS — Z794 Long term (current) use of insulin: Secondary | ICD-10-CM | POA: Diagnosis not present

## 2023-12-27 DIAGNOSIS — E559 Vitamin D deficiency, unspecified: Secondary | ICD-10-CM | POA: Diagnosis not present

## 2023-12-27 DIAGNOSIS — Z87891 Personal history of nicotine dependence: Secondary | ICD-10-CM | POA: Diagnosis not present

## 2023-12-27 DIAGNOSIS — M1990 Unspecified osteoarthritis, unspecified site: Secondary | ICD-10-CM | POA: Diagnosis not present

## 2023-12-28 DIAGNOSIS — I2510 Atherosclerotic heart disease of native coronary artery without angina pectoris: Secondary | ICD-10-CM | POA: Diagnosis not present

## 2023-12-28 DIAGNOSIS — Z87891 Personal history of nicotine dependence: Secondary | ICD-10-CM | POA: Diagnosis not present

## 2023-12-28 DIAGNOSIS — R634 Abnormal weight loss: Secondary | ICD-10-CM | POA: Diagnosis not present

## 2023-12-28 DIAGNOSIS — M1990 Unspecified osteoarthritis, unspecified site: Secondary | ICD-10-CM | POA: Diagnosis not present

## 2023-12-28 DIAGNOSIS — I119 Hypertensive heart disease without heart failure: Secondary | ICD-10-CM | POA: Diagnosis not present

## 2023-12-28 DIAGNOSIS — H409 Unspecified glaucoma: Secondary | ICD-10-CM | POA: Diagnosis not present

## 2023-12-28 DIAGNOSIS — E782 Mixed hyperlipidemia: Secondary | ICD-10-CM | POA: Diagnosis not present

## 2023-12-28 DIAGNOSIS — Z8673 Personal history of transient ischemic attack (TIA), and cerebral infarction without residual deficits: Secondary | ICD-10-CM | POA: Diagnosis not present

## 2023-12-28 DIAGNOSIS — Z556 Problems related to health literacy: Secondary | ICD-10-CM | POA: Diagnosis not present

## 2023-12-28 DIAGNOSIS — E1140 Type 2 diabetes mellitus with diabetic neuropathy, unspecified: Secondary | ICD-10-CM | POA: Diagnosis not present

## 2023-12-28 DIAGNOSIS — J302 Other seasonal allergic rhinitis: Secondary | ICD-10-CM | POA: Diagnosis not present

## 2023-12-28 DIAGNOSIS — Z604 Social exclusion and rejection: Secondary | ICD-10-CM | POA: Diagnosis not present

## 2023-12-28 DIAGNOSIS — E559 Vitamin D deficiency, unspecified: Secondary | ICD-10-CM | POA: Diagnosis not present

## 2023-12-28 DIAGNOSIS — G4700 Insomnia, unspecified: Secondary | ICD-10-CM | POA: Diagnosis not present

## 2023-12-28 DIAGNOSIS — Z794 Long term (current) use of insulin: Secondary | ICD-10-CM | POA: Diagnosis not present

## 2023-12-28 DIAGNOSIS — Z955 Presence of coronary angioplasty implant and graft: Secondary | ICD-10-CM | POA: Diagnosis not present

## 2023-12-28 DIAGNOSIS — Z7982 Long term (current) use of aspirin: Secondary | ICD-10-CM | POA: Diagnosis not present

## 2024-01-02 DIAGNOSIS — E782 Mixed hyperlipidemia: Secondary | ICD-10-CM | POA: Diagnosis not present

## 2024-01-02 DIAGNOSIS — E1140 Type 2 diabetes mellitus with diabetic neuropathy, unspecified: Secondary | ICD-10-CM | POA: Diagnosis not present

## 2024-01-02 DIAGNOSIS — M1990 Unspecified osteoarthritis, unspecified site: Secondary | ICD-10-CM | POA: Diagnosis not present

## 2024-01-02 DIAGNOSIS — Z8673 Personal history of transient ischemic attack (TIA), and cerebral infarction without residual deficits: Secondary | ICD-10-CM | POA: Diagnosis not present

## 2024-01-02 DIAGNOSIS — G4700 Insomnia, unspecified: Secondary | ICD-10-CM | POA: Diagnosis not present

## 2024-01-02 DIAGNOSIS — Z87891 Personal history of nicotine dependence: Secondary | ICD-10-CM | POA: Diagnosis not present

## 2024-01-02 DIAGNOSIS — Z556 Problems related to health literacy: Secondary | ICD-10-CM | POA: Diagnosis not present

## 2024-01-02 DIAGNOSIS — E559 Vitamin D deficiency, unspecified: Secondary | ICD-10-CM | POA: Diagnosis not present

## 2024-01-02 DIAGNOSIS — I119 Hypertensive heart disease without heart failure: Secondary | ICD-10-CM | POA: Diagnosis not present

## 2024-01-02 DIAGNOSIS — H409 Unspecified glaucoma: Secondary | ICD-10-CM | POA: Diagnosis not present

## 2024-01-02 DIAGNOSIS — Z604 Social exclusion and rejection: Secondary | ICD-10-CM | POA: Diagnosis not present

## 2024-01-02 DIAGNOSIS — J302 Other seasonal allergic rhinitis: Secondary | ICD-10-CM | POA: Diagnosis not present

## 2024-01-02 DIAGNOSIS — Z7982 Long term (current) use of aspirin: Secondary | ICD-10-CM | POA: Diagnosis not present

## 2024-01-02 DIAGNOSIS — R634 Abnormal weight loss: Secondary | ICD-10-CM | POA: Diagnosis not present

## 2024-01-02 DIAGNOSIS — Z794 Long term (current) use of insulin: Secondary | ICD-10-CM | POA: Diagnosis not present

## 2024-01-02 DIAGNOSIS — Z955 Presence of coronary angioplasty implant and graft: Secondary | ICD-10-CM | POA: Diagnosis not present

## 2024-01-02 DIAGNOSIS — I2510 Atherosclerotic heart disease of native coronary artery without angina pectoris: Secondary | ICD-10-CM | POA: Diagnosis not present

## 2024-01-04 DIAGNOSIS — I119 Hypertensive heart disease without heart failure: Secondary | ICD-10-CM | POA: Diagnosis not present

## 2024-01-04 DIAGNOSIS — Z556 Problems related to health literacy: Secondary | ICD-10-CM | POA: Diagnosis not present

## 2024-01-04 DIAGNOSIS — E782 Mixed hyperlipidemia: Secondary | ICD-10-CM | POA: Diagnosis not present

## 2024-01-04 DIAGNOSIS — Z955 Presence of coronary angioplasty implant and graft: Secondary | ICD-10-CM | POA: Diagnosis not present

## 2024-01-04 DIAGNOSIS — Z8673 Personal history of transient ischemic attack (TIA), and cerebral infarction without residual deficits: Secondary | ICD-10-CM | POA: Diagnosis not present

## 2024-01-04 DIAGNOSIS — G4700 Insomnia, unspecified: Secondary | ICD-10-CM | POA: Diagnosis not present

## 2024-01-04 DIAGNOSIS — R634 Abnormal weight loss: Secondary | ICD-10-CM | POA: Diagnosis not present

## 2024-01-04 DIAGNOSIS — E1140 Type 2 diabetes mellitus with diabetic neuropathy, unspecified: Secondary | ICD-10-CM | POA: Diagnosis not present

## 2024-01-04 DIAGNOSIS — E559 Vitamin D deficiency, unspecified: Secondary | ICD-10-CM | POA: Diagnosis not present

## 2024-01-04 DIAGNOSIS — J302 Other seasonal allergic rhinitis: Secondary | ICD-10-CM | POA: Diagnosis not present

## 2024-01-04 DIAGNOSIS — H409 Unspecified glaucoma: Secondary | ICD-10-CM | POA: Diagnosis not present

## 2024-01-04 DIAGNOSIS — Z604 Social exclusion and rejection: Secondary | ICD-10-CM | POA: Diagnosis not present

## 2024-01-04 DIAGNOSIS — Z87891 Personal history of nicotine dependence: Secondary | ICD-10-CM | POA: Diagnosis not present

## 2024-01-04 DIAGNOSIS — Z794 Long term (current) use of insulin: Secondary | ICD-10-CM | POA: Diagnosis not present

## 2024-01-04 DIAGNOSIS — I2510 Atherosclerotic heart disease of native coronary artery without angina pectoris: Secondary | ICD-10-CM | POA: Diagnosis not present

## 2024-01-04 DIAGNOSIS — M1990 Unspecified osteoarthritis, unspecified site: Secondary | ICD-10-CM | POA: Diagnosis not present

## 2024-01-04 DIAGNOSIS — Z7982 Long term (current) use of aspirin: Secondary | ICD-10-CM | POA: Diagnosis not present

## 2024-01-09 DIAGNOSIS — Z8673 Personal history of transient ischemic attack (TIA), and cerebral infarction without residual deficits: Secondary | ICD-10-CM | POA: Diagnosis not present

## 2024-01-09 DIAGNOSIS — M1990 Unspecified osteoarthritis, unspecified site: Secondary | ICD-10-CM | POA: Diagnosis not present

## 2024-01-09 DIAGNOSIS — Z794 Long term (current) use of insulin: Secondary | ICD-10-CM | POA: Diagnosis not present

## 2024-01-09 DIAGNOSIS — Z7982 Long term (current) use of aspirin: Secondary | ICD-10-CM | POA: Diagnosis not present

## 2024-01-09 DIAGNOSIS — Z87891 Personal history of nicotine dependence: Secondary | ICD-10-CM | POA: Diagnosis not present

## 2024-01-09 DIAGNOSIS — Z556 Problems related to health literacy: Secondary | ICD-10-CM | POA: Diagnosis not present

## 2024-01-09 DIAGNOSIS — H409 Unspecified glaucoma: Secondary | ICD-10-CM | POA: Diagnosis not present

## 2024-01-09 DIAGNOSIS — Z955 Presence of coronary angioplasty implant and graft: Secondary | ICD-10-CM | POA: Diagnosis not present

## 2024-01-09 DIAGNOSIS — I2510 Atherosclerotic heart disease of native coronary artery without angina pectoris: Secondary | ICD-10-CM | POA: Diagnosis not present

## 2024-01-09 DIAGNOSIS — E559 Vitamin D deficiency, unspecified: Secondary | ICD-10-CM | POA: Diagnosis not present

## 2024-01-09 DIAGNOSIS — Z604 Social exclusion and rejection: Secondary | ICD-10-CM | POA: Diagnosis not present

## 2024-01-09 DIAGNOSIS — G4700 Insomnia, unspecified: Secondary | ICD-10-CM | POA: Diagnosis not present

## 2024-01-09 DIAGNOSIS — E1140 Type 2 diabetes mellitus with diabetic neuropathy, unspecified: Secondary | ICD-10-CM | POA: Diagnosis not present

## 2024-01-09 DIAGNOSIS — I119 Hypertensive heart disease without heart failure: Secondary | ICD-10-CM | POA: Diagnosis not present

## 2024-01-09 DIAGNOSIS — E782 Mixed hyperlipidemia: Secondary | ICD-10-CM | POA: Diagnosis not present

## 2024-01-09 DIAGNOSIS — R634 Abnormal weight loss: Secondary | ICD-10-CM | POA: Diagnosis not present

## 2024-01-09 DIAGNOSIS — J302 Other seasonal allergic rhinitis: Secondary | ICD-10-CM | POA: Diagnosis not present

## 2024-01-16 ENCOUNTER — Other Ambulatory Visit: Payer: Self-pay | Admitting: Sports Medicine

## 2024-01-16 ENCOUNTER — Ambulatory Visit

## 2024-01-16 DIAGNOSIS — E1140 Type 2 diabetes mellitus with diabetic neuropathy, unspecified: Secondary | ICD-10-CM | POA: Diagnosis not present

## 2024-01-16 DIAGNOSIS — G3183 Dementia with Lewy bodies: Secondary | ICD-10-CM

## 2024-01-16 DIAGNOSIS — G4700 Insomnia, unspecified: Secondary | ICD-10-CM

## 2024-01-16 DIAGNOSIS — E559 Vitamin D deficiency, unspecified: Secondary | ICD-10-CM | POA: Diagnosis not present

## 2024-01-16 DIAGNOSIS — I2510 Atherosclerotic heart disease of native coronary artery without angina pectoris: Secondary | ICD-10-CM | POA: Diagnosis not present

## 2024-01-16 DIAGNOSIS — F411 Generalized anxiety disorder: Secondary | ICD-10-CM

## 2024-01-16 DIAGNOSIS — F409 Phobic anxiety disorder, unspecified: Secondary | ICD-10-CM

## 2024-01-16 DIAGNOSIS — F0284 Dementia in other diseases classified elsewhere, unspecified severity, with anxiety: Secondary | ICD-10-CM

## 2024-01-16 DIAGNOSIS — I10 Essential (primary) hypertension: Secondary | ICD-10-CM

## 2024-01-16 DIAGNOSIS — I119 Hypertensive heart disease without heart failure: Secondary | ICD-10-CM

## 2024-01-16 DIAGNOSIS — F02818 Dementia in other diseases classified elsewhere, unspecified severity, with other behavioral disturbance: Secondary | ICD-10-CM | POA: Diagnosis not present

## 2024-01-16 DIAGNOSIS — H409 Unspecified glaucoma: Secondary | ICD-10-CM | POA: Diagnosis not present

## 2024-01-16 DIAGNOSIS — F0282 Dementia in other diseases classified elsewhere, unspecified severity, with psychotic disturbance: Secondary | ICD-10-CM

## 2024-01-18 DIAGNOSIS — R634 Abnormal weight loss: Secondary | ICD-10-CM | POA: Diagnosis not present

## 2024-01-18 DIAGNOSIS — J302 Other seasonal allergic rhinitis: Secondary | ICD-10-CM | POA: Diagnosis not present

## 2024-01-18 DIAGNOSIS — E782 Mixed hyperlipidemia: Secondary | ICD-10-CM | POA: Diagnosis not present

## 2024-01-18 DIAGNOSIS — Z87891 Personal history of nicotine dependence: Secondary | ICD-10-CM | POA: Diagnosis not present

## 2024-01-18 DIAGNOSIS — Z604 Social exclusion and rejection: Secondary | ICD-10-CM | POA: Diagnosis not present

## 2024-01-18 DIAGNOSIS — Z8673 Personal history of transient ischemic attack (TIA), and cerebral infarction without residual deficits: Secondary | ICD-10-CM | POA: Diagnosis not present

## 2024-01-18 DIAGNOSIS — M1990 Unspecified osteoarthritis, unspecified site: Secondary | ICD-10-CM | POA: Diagnosis not present

## 2024-01-18 DIAGNOSIS — G4700 Insomnia, unspecified: Secondary | ICD-10-CM | POA: Diagnosis not present

## 2024-01-18 DIAGNOSIS — E1140 Type 2 diabetes mellitus with diabetic neuropathy, unspecified: Secondary | ICD-10-CM | POA: Diagnosis not present

## 2024-01-18 DIAGNOSIS — Z7982 Long term (current) use of aspirin: Secondary | ICD-10-CM | POA: Diagnosis not present

## 2024-01-18 DIAGNOSIS — E559 Vitamin D deficiency, unspecified: Secondary | ICD-10-CM | POA: Diagnosis not present

## 2024-01-18 DIAGNOSIS — Z794 Long term (current) use of insulin: Secondary | ICD-10-CM | POA: Diagnosis not present

## 2024-01-18 DIAGNOSIS — H409 Unspecified glaucoma: Secondary | ICD-10-CM | POA: Diagnosis not present

## 2024-01-18 DIAGNOSIS — I2510 Atherosclerotic heart disease of native coronary artery without angina pectoris: Secondary | ICD-10-CM | POA: Diagnosis not present

## 2024-01-18 DIAGNOSIS — Z955 Presence of coronary angioplasty implant and graft: Secondary | ICD-10-CM | POA: Diagnosis not present

## 2024-01-18 DIAGNOSIS — Z556 Problems related to health literacy: Secondary | ICD-10-CM | POA: Diagnosis not present

## 2024-01-18 DIAGNOSIS — I119 Hypertensive heart disease without heart failure: Secondary | ICD-10-CM | POA: Diagnosis not present

## 2024-01-19 ENCOUNTER — Other Ambulatory Visit: Payer: Self-pay | Admitting: Nurse Practitioner

## 2024-01-19 DIAGNOSIS — J302 Other seasonal allergic rhinitis: Secondary | ICD-10-CM | POA: Diagnosis not present

## 2024-01-19 DIAGNOSIS — Z7982 Long term (current) use of aspirin: Secondary | ICD-10-CM | POA: Diagnosis not present

## 2024-01-19 DIAGNOSIS — G4700 Insomnia, unspecified: Secondary | ICD-10-CM | POA: Diagnosis not present

## 2024-01-19 DIAGNOSIS — Z604 Social exclusion and rejection: Secondary | ICD-10-CM | POA: Diagnosis not present

## 2024-01-19 DIAGNOSIS — R634 Abnormal weight loss: Secondary | ICD-10-CM | POA: Diagnosis not present

## 2024-01-19 DIAGNOSIS — M1990 Unspecified osteoarthritis, unspecified site: Secondary | ICD-10-CM | POA: Diagnosis not present

## 2024-01-19 DIAGNOSIS — I2510 Atherosclerotic heart disease of native coronary artery without angina pectoris: Secondary | ICD-10-CM | POA: Diagnosis not present

## 2024-01-19 DIAGNOSIS — I10 Essential (primary) hypertension: Secondary | ICD-10-CM

## 2024-01-19 DIAGNOSIS — H409 Unspecified glaucoma: Secondary | ICD-10-CM | POA: Diagnosis not present

## 2024-01-19 DIAGNOSIS — E1140 Type 2 diabetes mellitus with diabetic neuropathy, unspecified: Secondary | ICD-10-CM | POA: Diagnosis not present

## 2024-01-19 DIAGNOSIS — I119 Hypertensive heart disease without heart failure: Secondary | ICD-10-CM | POA: Diagnosis not present

## 2024-01-19 DIAGNOSIS — E559 Vitamin D deficiency, unspecified: Secondary | ICD-10-CM | POA: Diagnosis not present

## 2024-01-19 DIAGNOSIS — Z8673 Personal history of transient ischemic attack (TIA), and cerebral infarction without residual deficits: Secondary | ICD-10-CM | POA: Diagnosis not present

## 2024-01-19 DIAGNOSIS — E782 Mixed hyperlipidemia: Secondary | ICD-10-CM | POA: Diagnosis not present

## 2024-01-19 DIAGNOSIS — Z794 Long term (current) use of insulin: Secondary | ICD-10-CM | POA: Diagnosis not present

## 2024-01-19 DIAGNOSIS — Z556 Problems related to health literacy: Secondary | ICD-10-CM | POA: Diagnosis not present

## 2024-01-19 DIAGNOSIS — Z87891 Personal history of nicotine dependence: Secondary | ICD-10-CM | POA: Diagnosis not present

## 2024-01-19 DIAGNOSIS — Z955 Presence of coronary angioplasty implant and graft: Secondary | ICD-10-CM | POA: Diagnosis not present

## 2024-01-25 DIAGNOSIS — G4700 Insomnia, unspecified: Secondary | ICD-10-CM | POA: Diagnosis not present

## 2024-01-25 DIAGNOSIS — Z794 Long term (current) use of insulin: Secondary | ICD-10-CM | POA: Diagnosis not present

## 2024-01-25 DIAGNOSIS — E782 Mixed hyperlipidemia: Secondary | ICD-10-CM | POA: Diagnosis not present

## 2024-01-25 DIAGNOSIS — I119 Hypertensive heart disease without heart failure: Secondary | ICD-10-CM | POA: Diagnosis not present

## 2024-01-25 DIAGNOSIS — Z955 Presence of coronary angioplasty implant and graft: Secondary | ICD-10-CM | POA: Diagnosis not present

## 2024-01-25 DIAGNOSIS — Z8673 Personal history of transient ischemic attack (TIA), and cerebral infarction without residual deficits: Secondary | ICD-10-CM | POA: Diagnosis not present

## 2024-01-25 DIAGNOSIS — J302 Other seasonal allergic rhinitis: Secondary | ICD-10-CM | POA: Diagnosis not present

## 2024-01-25 DIAGNOSIS — E559 Vitamin D deficiency, unspecified: Secondary | ICD-10-CM | POA: Diagnosis not present

## 2024-01-25 DIAGNOSIS — H409 Unspecified glaucoma: Secondary | ICD-10-CM | POA: Diagnosis not present

## 2024-01-25 DIAGNOSIS — M1990 Unspecified osteoarthritis, unspecified site: Secondary | ICD-10-CM | POA: Diagnosis not present

## 2024-01-25 DIAGNOSIS — Z556 Problems related to health literacy: Secondary | ICD-10-CM | POA: Diagnosis not present

## 2024-01-25 DIAGNOSIS — Z604 Social exclusion and rejection: Secondary | ICD-10-CM | POA: Diagnosis not present

## 2024-01-25 DIAGNOSIS — I2510 Atherosclerotic heart disease of native coronary artery without angina pectoris: Secondary | ICD-10-CM | POA: Diagnosis not present

## 2024-01-25 DIAGNOSIS — E1140 Type 2 diabetes mellitus with diabetic neuropathy, unspecified: Secondary | ICD-10-CM | POA: Diagnosis not present

## 2024-01-25 DIAGNOSIS — Z7982 Long term (current) use of aspirin: Secondary | ICD-10-CM | POA: Diagnosis not present

## 2024-01-25 DIAGNOSIS — Z87891 Personal history of nicotine dependence: Secondary | ICD-10-CM | POA: Diagnosis not present

## 2024-01-25 DIAGNOSIS — R634 Abnormal weight loss: Secondary | ICD-10-CM | POA: Diagnosis not present

## 2024-01-29 DIAGNOSIS — I119 Hypertensive heart disease without heart failure: Secondary | ICD-10-CM | POA: Diagnosis not present

## 2024-01-29 DIAGNOSIS — Z87891 Personal history of nicotine dependence: Secondary | ICD-10-CM | POA: Diagnosis not present

## 2024-01-29 DIAGNOSIS — Z955 Presence of coronary angioplasty implant and graft: Secondary | ICD-10-CM | POA: Diagnosis not present

## 2024-01-29 DIAGNOSIS — Z794 Long term (current) use of insulin: Secondary | ICD-10-CM | POA: Diagnosis not present

## 2024-01-29 DIAGNOSIS — I2510 Atherosclerotic heart disease of native coronary artery without angina pectoris: Secondary | ICD-10-CM | POA: Diagnosis not present

## 2024-01-29 DIAGNOSIS — J302 Other seasonal allergic rhinitis: Secondary | ICD-10-CM | POA: Diagnosis not present

## 2024-01-29 DIAGNOSIS — H409 Unspecified glaucoma: Secondary | ICD-10-CM | POA: Diagnosis not present

## 2024-01-29 DIAGNOSIS — Z7982 Long term (current) use of aspirin: Secondary | ICD-10-CM | POA: Diagnosis not present

## 2024-01-29 DIAGNOSIS — E1140 Type 2 diabetes mellitus with diabetic neuropathy, unspecified: Secondary | ICD-10-CM | POA: Diagnosis not present

## 2024-01-29 DIAGNOSIS — E782 Mixed hyperlipidemia: Secondary | ICD-10-CM | POA: Diagnosis not present

## 2024-01-29 DIAGNOSIS — E559 Vitamin D deficiency, unspecified: Secondary | ICD-10-CM | POA: Diagnosis not present

## 2024-01-29 DIAGNOSIS — M1990 Unspecified osteoarthritis, unspecified site: Secondary | ICD-10-CM | POA: Diagnosis not present

## 2024-01-29 DIAGNOSIS — Z8673 Personal history of transient ischemic attack (TIA), and cerebral infarction without residual deficits: Secondary | ICD-10-CM | POA: Diagnosis not present

## 2024-01-29 DIAGNOSIS — Z556 Problems related to health literacy: Secondary | ICD-10-CM | POA: Diagnosis not present

## 2024-01-29 DIAGNOSIS — G4700 Insomnia, unspecified: Secondary | ICD-10-CM | POA: Diagnosis not present

## 2024-01-29 DIAGNOSIS — R634 Abnormal weight loss: Secondary | ICD-10-CM | POA: Diagnosis not present

## 2024-01-29 DIAGNOSIS — Z604 Social exclusion and rejection: Secondary | ICD-10-CM | POA: Diagnosis not present

## 2024-01-30 DIAGNOSIS — Z604 Social exclusion and rejection: Secondary | ICD-10-CM | POA: Diagnosis not present

## 2024-01-30 DIAGNOSIS — Z7982 Long term (current) use of aspirin: Secondary | ICD-10-CM | POA: Diagnosis not present

## 2024-01-30 DIAGNOSIS — E1140 Type 2 diabetes mellitus with diabetic neuropathy, unspecified: Secondary | ICD-10-CM | POA: Diagnosis not present

## 2024-01-30 DIAGNOSIS — J302 Other seasonal allergic rhinitis: Secondary | ICD-10-CM | POA: Diagnosis not present

## 2024-01-30 DIAGNOSIS — M1990 Unspecified osteoarthritis, unspecified site: Secondary | ICD-10-CM | POA: Diagnosis not present

## 2024-01-30 DIAGNOSIS — E782 Mixed hyperlipidemia: Secondary | ICD-10-CM | POA: Diagnosis not present

## 2024-01-30 DIAGNOSIS — Z556 Problems related to health literacy: Secondary | ICD-10-CM | POA: Diagnosis not present

## 2024-01-30 DIAGNOSIS — I119 Hypertensive heart disease without heart failure: Secondary | ICD-10-CM | POA: Diagnosis not present

## 2024-01-30 DIAGNOSIS — H409 Unspecified glaucoma: Secondary | ICD-10-CM | POA: Diagnosis not present

## 2024-01-30 DIAGNOSIS — Z8673 Personal history of transient ischemic attack (TIA), and cerebral infarction without residual deficits: Secondary | ICD-10-CM | POA: Diagnosis not present

## 2024-01-30 DIAGNOSIS — G4700 Insomnia, unspecified: Secondary | ICD-10-CM | POA: Diagnosis not present

## 2024-01-30 DIAGNOSIS — Z794 Long term (current) use of insulin: Secondary | ICD-10-CM | POA: Diagnosis not present

## 2024-01-30 DIAGNOSIS — R634 Abnormal weight loss: Secondary | ICD-10-CM | POA: Diagnosis not present

## 2024-01-30 DIAGNOSIS — E559 Vitamin D deficiency, unspecified: Secondary | ICD-10-CM | POA: Diagnosis not present

## 2024-01-30 DIAGNOSIS — Z955 Presence of coronary angioplasty implant and graft: Secondary | ICD-10-CM | POA: Diagnosis not present

## 2024-01-30 DIAGNOSIS — I2510 Atherosclerotic heart disease of native coronary artery without angina pectoris: Secondary | ICD-10-CM | POA: Diagnosis not present

## 2024-02-06 DIAGNOSIS — R634 Abnormal weight loss: Secondary | ICD-10-CM | POA: Diagnosis not present

## 2024-02-06 DIAGNOSIS — Z604 Social exclusion and rejection: Secondary | ICD-10-CM | POA: Diagnosis not present

## 2024-02-06 DIAGNOSIS — H409 Unspecified glaucoma: Secondary | ICD-10-CM | POA: Diagnosis not present

## 2024-02-06 DIAGNOSIS — G4700 Insomnia, unspecified: Secondary | ICD-10-CM | POA: Diagnosis not present

## 2024-02-06 DIAGNOSIS — Z7982 Long term (current) use of aspirin: Secondary | ICD-10-CM | POA: Diagnosis not present

## 2024-02-06 DIAGNOSIS — M1990 Unspecified osteoarthritis, unspecified site: Secondary | ICD-10-CM | POA: Diagnosis not present

## 2024-02-06 DIAGNOSIS — I2510 Atherosclerotic heart disease of native coronary artery without angina pectoris: Secondary | ICD-10-CM | POA: Diagnosis not present

## 2024-02-06 DIAGNOSIS — Z556 Problems related to health literacy: Secondary | ICD-10-CM | POA: Diagnosis not present

## 2024-02-06 DIAGNOSIS — J302 Other seasonal allergic rhinitis: Secondary | ICD-10-CM | POA: Diagnosis not present

## 2024-02-06 DIAGNOSIS — Z87891 Personal history of nicotine dependence: Secondary | ICD-10-CM | POA: Diagnosis not present

## 2024-02-06 DIAGNOSIS — Z8673 Personal history of transient ischemic attack (TIA), and cerebral infarction without residual deficits: Secondary | ICD-10-CM | POA: Diagnosis not present

## 2024-02-06 DIAGNOSIS — E559 Vitamin D deficiency, unspecified: Secondary | ICD-10-CM | POA: Diagnosis not present

## 2024-02-06 DIAGNOSIS — I119 Hypertensive heart disease without heart failure: Secondary | ICD-10-CM | POA: Diagnosis not present

## 2024-02-06 DIAGNOSIS — Z794 Long term (current) use of insulin: Secondary | ICD-10-CM | POA: Diagnosis not present

## 2024-02-06 DIAGNOSIS — Z955 Presence of coronary angioplasty implant and graft: Secondary | ICD-10-CM | POA: Diagnosis not present

## 2024-02-06 DIAGNOSIS — E1140 Type 2 diabetes mellitus with diabetic neuropathy, unspecified: Secondary | ICD-10-CM | POA: Diagnosis not present

## 2024-02-15 NOTE — Progress Notes (Signed)
 A1c she is okay    Subjective:  Patient ID: Robyn [name], female    DOB: [birth year], 89 y.o.   MRN: [id number]  Patient Care Team: Deitra Morton Hummer, Nena, NP as PCP - General (Nurse Practitioner) Georjean Darice HERO, MD as Consulting Physician (Neurology) Silva Juliene SAUNDERS, DPM as Consulting Physician (Podiatry) Dina, Camie FORBES RIGGERS (Neurology)   Chief Complaint:  Medical Management of Chronic Issues (3 month )   HPI: Robyn [name] is a 89 y.o. female presenting on 02/19/2024 for Medical Management of Chronic Issues (3 month )   Discussed the use of AI scribe software for clinical note transcription with the patient, who gave verbal consent to proceed.  History of Present Illness Robyn [name] is an 89 year old female with type 2 diabetes, cognitive decline, and hypertension who presents for a three-month follow-up for chronic disease management. She is accompanied by her daughter.  She is experiencing increased difficulty with balance, staggering frequently, and nearly falling several times today. Dizziness occurs when standing, accompanied by a sensation of numbness or tightness at the top of her head. Her daughter notes that today has been unusual regarding her walking and balance issues.  She has lost eight pounds recently and sometimes lacks appetite. Blood sugars have been consistently high, with readings reaching 200-300 mg/dL, despite not consuming sugar or high-sodium foods. Her current insulin  regimen is 10 units of Humulin  70/30. Her last A1c was 9.1%, and today it is 8.8%.  She has been diagnosed with Lewy body dementia, and her cognitive decline is progressing. Her daughter reports increased agitation and behavioral changes, including attempts to hit people with objects. She has been on Depakote  for over a year, which her daughter questions due to its side effects mimicking dementia symptoms.  She has a history of diabetic foot issues, including  calluses on both heels and an ingrown toenail with a fungal infection on the right big toe. She previously saw a podiatrist but has not returned due to a negative experience. Her daughter is concerned about her foot care and the need for regular podiatry visits.  She is not drinking enough water, consuming only a small bottle daily, which her daughter believes may be contributing to dehydration. Her urine is darker than normal,  burning during urination, but no pain . She has had two UTIs in the past three years.  Her daughter has guardianship and reports that she should not be left alone due to her dementia. She lives a mile and a half from her daughter, who visits multiple times a day to assist with daily activities. Her daughter is concerned about her ability to live independently and the need for additional home health support.      Relevant past medical, surgical, family, and social history reviewed and updated as indicated.  Allergies and medications reviewed and updated. Data reviewed: Chart in Epic.   Past Medical History:  Diagnosis Date   Coronary artery disease    Dementia (HCC)    Diabetes mellitus    Glaucoma    Hallucination    Heart disease    History of heart artery stent 2009   Hypertension    Memory loss    just started on aricept    Mild cognitive impairment    Osteoarthritis    Phobia    Psychosis (HCC)    Stroke (HCC)    Vision abnormalities     Past Surgical History:  Procedure Laterality Date   CORONARY ANGIOPLASTY WITH STENT PLACEMENT  CORONARY STENT PLACEMENT      Social History   Socioeconomic History   Marital status: Widowed    Spouse name: Not on file   Number of children: Not on file   Years of education: 10   Highest education level: 10th grade  Occupational History   Occupation: retired  Tobacco Use   Smoking status: Former    Current packs/day: 0.00    Types: Cigarettes    Quit date: 10/08/1963    Years since quitting: 60.4    Smokeless tobacco: Never  Vaping Use   Vaping status: Never Used  Substance and Sexual Activity   Alcohol use: No   Drug use: No   Sexual activity: Not on file  Other Topics Concern   Not on file  Social History Narrative   Has a legal guardian    Diet:      Caffeine:      Married, if yes what year:       Do you live in a house, apartment, assisted living, condo, trailer, ect: Apartment      Is it one or more stories: one      How many persons live in your home? Self      Pets: No      Highest level or education completed: 10 th      Current/Past profession: Museum/gallery curator      Exercise:                  Type and how often:          Living Will: No   DNR: No   POA/HPOA: No      Functional Status:   Do you have difficulty bathing or dressing yourself? No   Do you have difficulty preparing food or eating?   Do you have difficulty managing your medications?   Do you have difficulty managing your finances?   Do you have difficulty affording your medications? No      Right handed       Social Drivers of Health   Financial Resource Strain: Low Risk  (11/16/2023)   Overall Financial Resource Strain (CARDIA)    Difficulty of Paying Living Expenses: Not hard at all  Food Insecurity: No Food Insecurity (11/16/2023)   Hunger Vital Sign    Worried About Running Out of Food in the Last Year: Never true    Ran Out of Food in the Last Year: Never true  Transportation Needs: No Transportation Needs (11/16/2023)   PRAPARE - Administrator, Civil Service (Medical): No    Lack of Transportation (Non-Medical): No  Physical Activity: Unknown (11/16/2023)   Exercise Vital Sign    Days of Exercise per Week: Patient declined    Minutes of Exercise per Session: Not on file  Recent Concern: Physical Activity - Insufficiently Active (10/09/2023)   Exercise Vital Sign    Days of Exercise per Week: 2 days    Minutes of Exercise per Session: 10 min  Stress: No Stress  Concern Present (11/16/2023)   [employer] of Occupational Health - Occupational Stress Questionnaire    Feeling of Stress: Only a little  Social Connections: Unknown (11/16/2023)   Social Connection and Isolation Panel    Frequency of Communication with Friends and Family: Never    Frequency of Social Gatherings with Friends and Family: Never    Attends Religious Services: Never    Database administrator or Organizations: Patient declined  Attends Banker Meetings: Not on file    Marital Status: Widowed  Recent Concern: Social Connections - Socially Isolated (10/09/2023)   Social Connection and Isolation Panel    Frequency of Communication with Friends and Family: More than three times a week    Frequency of Social Gatherings with Friends and Family: Once a week    Attends Religious Services: Never    Database administrator or Organizations: No    Attends Banker Meetings: Never    Marital Status: Widowed  Intimate Partner Violence: Not At Risk (10/09/2023)   Humiliation, Afraid, Rape, and Kick questionnaire    Fear of Current or Ex-Partner: No    Emotionally Abused: No    Physically Abused: No    Sexually Abused: No    Outpatient Encounter Medications as of 02/19/2024  Medication Sig   ASPIRIN 81 PO Take 81 mg by mouth 2 (two) times a week.   atorvastatin  (LIPITOR) 10 MG tablet Take 1 tablet (10 mg total) by mouth daily.   dapagliflozin propanediol (FARXIGA) 5 MG TABS tablet Take 1 tablet (5 mg total) by mouth daily.   divalproex  (DEPAKOTE ) 125 MG DR tablet Take 1 tablet (125 mg total) by mouth 2 (two) times daily.   glucose blood (ONETOUCH VERIO) test strip Use to test blood sugar three times daily. Dx: E11.40   insulin  isophane & regular human KwikPen (HUMULIN  70/30 KWIKPEN) (70-30) 100 UNIT/ML KwikPen Inject 20 Units into the skin daily. If fasting or evening blood sugar is 85 or less, reduce units by 3.   Insulin  Pen Needle 31G X 6 MM MISC Use for  insulin  once daily. Dx: E11.40   Lancets (ONETOUCH ULTRASOFT) lancets Use to test blood sugar three time daily. Dx: E11.40   levocetirizine (XYZAL ) 5 MG tablet Take 0.5 tablets (2.5 mg total) by mouth every evening.   lisinopril -hydrochlorothiazide  (ZESTORETIC ) 10-12.5 MG tablet Take 1 tablet by mouth once daily   Nutritional Supplements (ENSURE NUTRITION SHAKE) LIQD Take 1 Can by mouth in the morning and at bedtime.   diclofenac  Sodium (VOLTAREN ) 1 % GEL Apply 2 g topically 4 (four) times daily.   [DISCONTINUED] benzonatate  (TESSALON  PERLES) 100 MG capsule Take 1 capsule (100 mg total) by mouth 3 (three) times daily as needed.   No facility-administered encounter medications on file as of 02/19/2024.    No Known Allergies  Pertinent ROS per HPI, otherwise unremarkable Review of Systems  Constitutional:  Negative for chills and fever.  Respiratory:  Negative for sputum production and shortness of breath.   Cardiovascular:  Negative for chest pain and leg swelling.  Gastrointestinal:  Negative for constipation, diarrhea, nausea and vomiting.  Genitourinary:  Positive for dysuria and frequency.       Urine cloudy  Musculoskeletal:  Negative for falls.  Skin:  Negative for itching and rash.  Neurological:  Negative for dizziness and headaches.  Psychiatric/Behavioral:  Positive for memory loss. Negative for suicidal ideas.         Objective:  BP 130/77   Pulse 60   Temp (!) 97.1 F (36.2 C) (Temporal)   Ht 5' 1 (1.549 m)   Wt 122 lb 6.4 oz (55.5 kg)   SpO2 100%   BMI 23.13 kg/m    Wt Readings from Last 3 Encounters:  02/19/24 122 lb 6.4 oz (55.5 kg)  11/16/23 125 lb 9.6 oz (57 kg)  11/10/23 121 lb (54.9 kg)   BP Readings from Last 3 Encounters:  02/19/24  130/77  11/16/23 130/70  11/10/23 (!) 147/68     Physical Exam Vitals and nursing note reviewed.  Constitutional:      Appearance: Normal appearance. She is not ill-appearing.  HENT:     Head: Normocephalic and  atraumatic.     Nose: Nose normal.     Mouth/Throat:     Mouth: Mucous membranes are moist.  Eyes:     Extraocular Movements: Extraocular movements intact.     Conjunctiva/sclera: Conjunctivae normal.     Pupils: Pupils are equal, round, and reactive to light.  Cardiovascular:     Heart sounds: Normal heart sounds.  Pulmonary:     Effort: Pulmonary effort is normal.     Breath sounds: Normal breath sounds.  Abdominal:     General: Bowel sounds are normal.     Palpations: Abdomen is soft.  Musculoskeletal:        General: Normal range of motion.     Right lower leg: No edema.     Left lower leg: No edema.  Skin:    Capillary Refill: Capillary refill takes 2 to 3 seconds.  Neurological:     Mental Status: She is alert and oriented to person, place, and time. Mental status is at baseline.     Comments: Slow unsteady with a cane  Psychiatric:        Mood and Affect: Mood normal.        Behavior: Behavior normal.        Thought Content: Thought content normal.        Judgment: Judgment normal.    Physical Exam    Urine dipstick shows positive for RBC's, positive for glucose, and positive for leukocytes 1+.  Micro exam: 6-10 WBC's per HPF, 0-2 RBC's per HPF, few + bacteria, mucus threads present and epithelial cells 0-10.   Results for orders placed or performed in visit on 11/16/23  Bayer DCA Hb A1c Waived   Collection Time: 11/16/23  4:15 PM  Result Value Ref Range   HB A1C (BAYER DCA - WAIVED) 9.1 (H) 4.8 - 5.6 %       Pertinent labs & imaging results that were available during my care of the patient were reviewed by me and considered in my medical decision making.  Assessment & Plan:  Robyn [name] was seen today for medical management of chronic issues.  Diagnoses and all orders for this visit:  Essential hypertension  Type 2 diabetes mellitus with diabetic neuropathy, with long-term current use of insulin  (HCC) -     Bayer DCA Hb A1c Waived -     dapagliflozin  propanediol (FARXIGA) 5 MG TABS tablet; Take 1 tablet (5 mg total) by mouth daily. -     Ambulatory referral to Ophthalmology  Mixed hyperlipidemia  Cognitive decline  Vitamin D deficiency  Ingrowing toenail with infection -     Ambulatory referral to Podiatry  Callus of heel -     Ambulatory referral to Podiatry     Assessment and Plan Assessment & Plan Lewy body dementia with cognitive decline Progressive cognitive decline with increased agitation and hallucinations. Concerns about Depakote 's efficacy and potential symptom contribution. - Continue current medications until neurology follow-up in November. - Discuss Depakote  concerns with neurology in November. - Consider Namenda and Donepezil  if no changes by neurology. - Upload guardianship letter and medical documentation for potential home health aid assistance.  Type 2 diabetes mellitus with poor glycemic control and diabetic neuropathy Poor glycemic control with A1c of 8.8.  High blood glucose despite dietary management and insulin . Recent weight loss. Neuropathy with calluses and ingrown toenail. - Add Farxiga 5 mg daily to insulin  regimen. - Continue insulin  therapy with 10 units of Humulin  70/30. - Refer to podiatry for diabetic foot care and management of calluses and ingrown toenail. - Administer insulin  in the morning and Farxiga in the evening to avoid hypoglycemia. - diabetic foot exam completed - referral to ophthalmology for diabetic retina exam  Dehydration Signs of dehydration with dark urine and reduced intake, possibly contributing to dizziness and balance issues. - Encourage increased water intake, aiming for 2-3 bottles per day. - Use Pedialyte or rehydration solutions to improve hydration. - Obtain urine sample to rule out UTI. - Bactrim twice daily for 7 days while waiting for urine culture to come back  Onychomycosis of right great toe and calluses of bilateral heels Presence of onychomycosis on  right great toe and calluses on both heels. - Refer to podiatry for management of onychomycosis and calluses.  Essential hypertension Blood pressure generally well-controlled with occasional elevations. Continue lisinopril  10-12.5 mg daily, no refill needed   Mixed hyperlipidemia Continue Lipitor 10 mg daily no refill needed   Health maintenance: Shingles second dose and flu vaccine administered today   Continue all other maintenance medications.  Follow up plan: Return in about 3 months (around 05/20/2024) for chronic Diseases management.   Continue healthy lifestyle choices, including diet (rich in fruits, vegetables, and lean proteins, and low in salt and simple carbohydrates) and exercise (at least 30 minutes of moderate physical activity daily).  Educational handout given for  Corns and Calluses: What to Know     Corns and calluses are skin conditions that can cause discomfort. Corns are small areas of thickened skin that usually form on a toe. They have a cone-shaped core that can press on nerves, causing pain. Calluses are larger areas of thickened skin that can form anywhere on the body. They often show up on the hands, soles of the feet, and heels. Calluses don't have a pointy core like corns do. What are the causes? Corns and calluses are caused by rubbing or pressure. Tight or bad-fitting shoes are often what cause these things. What increases the risk? Corns Corns are more likely to develop in people who have misshapen toes, such as hammertoes. Calluses Calluses are more likely to develop in people who: Work with their hands. Wear shoes that fit poorly, are too tight, or have high heels. Have misshapen toes. Some conditions make you more likely to get calluses and can lead to problems like ulceration or infection. These include: Diabetes. Poor blood flow. Numbness in your feet. What are the signs or symptoms? Symptoms of a corn or callus include: A hard  growth on the skin. Pain or tenderness under the skin. Redness and swelling. More discomfort when wearing tight shoes, if your feet are affected. If a corn or callus becomes infected, symptoms may include: Redness and swelling that gets worse. Pain. Fluid, blood, or pus coming out of the corn or callus. How is this diagnosed? Corns and calluses may be diagnosed based on your: Symptoms. Medical history. Physical exam. How is this treated? Treatment for corns and calluses may include: Fixing the cause, like changing shoes, wearing gloves, or using shoe inserts or protective pads. Applying lotion to soften the skin. This may include using a medicated moisturizer. Gently removing dead layers of skin. Removing the corn or callus with a scalpel or laser. This is  done by a health care provider who specializes in skin or feet (dermatologist or podiatrist). Taking antibiotics, if it's infected. Having surgery for a misshapen toe. Follow these instructions at home:  Take your medicine only as told. If you were given antibiotics, take them as told. Do not stop taking them even if you start to feel better. Wear shoes that fit well. Avoid wearing shoes that have high heels or are too tight or too loose. Wear padding, protective layers, gloves, or orthotics as told. Soak your hands or feet and file the corn or callus with a file or pumice stone. Do this as told by your provider. Check your corn or callus every day for signs of infection. Contact a health care provider if: Your symptoms don't get better with treatment. You have redness or swelling that gets worse. Your corn or callus is painful. You have fluid, blood, or pus coming from your corn or callus. You have new symptoms. Get help right away if: You have very bad pain with redness. This information is not intended to replace advice given to you by your health care provider. Make sure you discuss any questions you have with your health  care provider. Document Revised: 11/03/2022 Document Reviewed: 11/03/2022 Elsevier Patient Education  2024 [employer]. Supporting Someone With Anosognosia Anosognosia is a disorder that often affects people who have a brain injury or brain disease. A person with anosognosia lacks awareness that he or she has a problem or disease. It is typical for people with this condition to think they are okay when they are not. Anosognosia is common in people with diseases such as: Schizophrenia. Bipolar disorder. It may also affect people who have brain diseases such as: Alzheimer disease or dementia. Stroke or a brain injury. Anosognosia can add to the person's unwillingness to take needed medicines or get other treatments. When a person does not get the treatment he or she needs, the condition can get worse. Caring for a friend or family member with anosognosia can be hard. It may seem like the person denies his or her problems, but that is not the case. If the person resists required treatment, it is because he or she does not believe that he or she needs help. The person does not have the ability to know and understand his or her problems. Damage to the brain is common in people who have this disorder, but the exact cause of the condition is not known. How to support someone with anosognosia  Keep in mind that people with anosognosia are at risk for injury because of behavior that could be reckless or dangerous. To help the person avoid injury, talk about his or her risky or dangerous actions instead of trying to discuss the disease. Follow instructions from the health care or mental health provider about how to give medicines at home. Remember that a person with anosognosia does not believe that medicine is needed for a brain injury, brain disease, or mental illness. Be prepared to offer treatment, and encourage the person to accept treatment. Be cautious about trying to force your friend or family  member into treatment. This power struggle may make him or her feel more isolated. Work with the person to develop a treatment plan that he or she will agree to follow. A therapist who specializes in a type of therapy called motivational enhancement therapy can be helpful in getting the person to follow a treatment plan. Keep these things in mind when you talk with  your friend or family member: Listen to his or her perception of life. You do not have to agree with him or her, but help him or her to feel understood. Be understanding about what he or she is saying. Think about how hard it must be for that person when everyone is telling them about the treatment that he or she needs. Avoid arguing about his or her condition or treatment. Do not get emotional. Be aware of your own reactions. If he or she becomes angry or defiant, follow instructions from the health care provider about how to deal with the situation. Contact a health care provider if: The person refuses to take prescribed medicines. The person is ignoring or refusing the treatment plan that you have both agreed on. The symptoms of the injury or illness get worse. The person leaves the house or disappears for an extended amount of time. You need more information or need help to give the care that is needed. Get help right away if: The person with anosognosia becomes aggressive with you or others. The person destroys property or he or she acts in an unsafe or reckless manner. The person talks about self-harm or threatens to harm others. The person hurts himself or herself. Get help right away if you feel like your loved one may hurt himself or herself or others, or if he or she has thoughts about taking his or her own life. Go to your nearest emergency room or: Call 911. Call the National Suicide Prevention Lifeline at 847-379-2155 or 988. This is open 24 hours a day. Text the Crisis Text Line at 3157182638. Summary Because a person  with anosognosia lacks awareness that he or she has a problem or disease, it can be hard to care for him or her. The person with anosognosia may seem to deny that he or she sick. Rather, the person resists following recommendations because he or she does not have awareness or belief that treatment is needed. Avoid arguing about the person's condition or treatment. Do not get emotional. Be aware of your own reactions. A therapist who specializes in a type of therapy called motivational enhancement therapy can be helpful in getting the person to follow a treatment plan. The person with anosognosia is at risk for injury because he or she may behave in a reckless or dangerous way. This information is not intended to replace advice given to you by your health care provider. Make sure you discuss any questions you have with your health care provider. Document Revised: 04/04/2021 Document Reviewed: 04/04/2021 Elsevier Patient Education  2024 [employer] Hypertension, Adult High blood pressure (hypertension) is when the force of blood pumping through the arteries is too strong. The arteries are the blood vessels that carry blood from the heart throughout the body. Hypertension forces the heart to work harder to pump blood and may cause arteries to become narrow or stiff. Untreated or uncontrolled hypertension can lead to a heart attack, heart failure, a stroke, kidney disease, and other problems. A blood pressure reading consists of a higher number over a lower number. Ideally, your blood pressure should be below 120/80. The first (top) number is called the systolic pressure. It is a measure of the pressure in your arteries as your heart beats. The second (bottom) number is called the diastolic pressure. It is a measure of the pressure in your arteries as the heart relaxes. What are the causes? The exact cause of this condition is not known. There are  some conditions that result in high blood  pressure. What increases the risk? Certain factors may make you more likely to develop high blood pressure. Some of these risk factors are under your control, including: Smoking. Not getting enough exercise or physical activity. Being overweight. Having too much fat, sugar, calories, or salt (sodium) in your diet. Drinking too much alcohol. Other risk factors include: Having a personal history of heart disease, diabetes, high cholesterol, or kidney disease. Stress. Having a family history of high blood pressure and high cholesterol. Having obstructive sleep apnea. Age. The risk increases with age. What are the signs or symptoms? High blood pressure may not cause symptoms. Very high blood pressure (hypertensive crisis) may cause: Headache. Fast or irregular heartbeats (palpitations). Shortness of breath. Nosebleed. Nausea and vomiting. Vision changes. Severe chest pain, dizziness, and seizures. How is this diagnosed? This condition is diagnosed by measuring your blood pressure while you are seated, with your arm resting on a flat surface, your legs uncrossed, and your feet flat on the floor. The cuff of the blood pressure monitor will be placed directly against the skin of your upper arm at the level of your heart. Blood pressure should be measured at least twice using the same arm. Certain conditions can cause a difference in blood pressure between your right and left arms. If you have a high blood pressure reading during one visit or you have normal blood pressure with other risk factors, you may be asked to: Return on a different day to have your blood pressure checked again. Monitor your blood pressure at home for 1 week or longer. If you are diagnosed with hypertension, you may have other blood or imaging tests to help your health care provider understand your overall risk for other conditions. How is this treated? This condition is treated by making healthy lifestyle changes, such  as eating healthy foods, exercising more, and reducing your alcohol intake. You may be referred for counseling on a healthy diet and physical activity. Your health care provider may prescribe medicine if lifestyle changes are not enough to get your blood pressure under control and if: Your systolic blood pressure is above 130. Your diastolic blood pressure is above 80. Your personal target blood pressure may vary depending on your medical conditions, your age, and other factors. Follow these instructions at home: Eating and drinking  Eat a diet that is high in fiber and potassium, and low in sodium, added sugar, and fat. An example of this eating plan is called the DASH diet. DASH stands for Dietary Approaches to Stop Hypertension. To eat this way: Eat plenty of fresh fruits and vegetables. Try to fill one half of your plate at each meal with fruits and vegetables. Eat whole grains, such as whole-wheat pasta, brown rice, or whole-grain bread. Fill about one fourth of your plate with whole grains. Eat or drink low-fat dairy products, such as skim milk or low-fat yogurt. Avoid fatty cuts of meat, processed or cured meats, and poultry with skin. Fill about one fourth of your plate with lean proteins, such as fish, chicken without skin, beans, eggs, or tofu. Avoid pre-made and processed foods. These tend to be higher in sodium, added sugar, and fat. Reduce your daily sodium intake. Many people with hypertension should eat less than 1,500 mg of sodium a day. Do not drink alcohol if: Your health care provider tells you not to drink. You are pregnant, may be pregnant, or are planning to become pregnant. If you drink  alcohol: Limit how much you have to: 0-1 drink a day for women. 0-2 drinks a day for men. Know how much alcohol is in your drink. In the [place] one drink equals one 12 oz bottle of beer (355 mL), one 5 oz glass of wine (148 mL), or one 1 oz glass of hard liquor (44 mL). Lifestyle  Work  with your health care provider to maintain a healthy body weight or to lose weight. Ask what an ideal weight is for you. Get at least 30 minutes of exercise that causes your heart to beat faster (aerobic exercise) most days of the week. Activities may include walking, swimming, or biking. Include exercise to strengthen your muscles (resistance exercise), such as Pilates or lifting weights, as part of your weekly exercise routine. Try to do these types of exercises for 30 minutes at least 3 days a week. Do not use any products that contain nicotine or tobacco. These products include cigarettes, chewing tobacco, and vaping devices, such as e-cigarettes. If you need help quitting, ask your health care provider. Monitor your blood pressure at home as told by your health care provider. Keep all follow-up visits. This is important. Medicines Take over-the-counter and prescription medicines only as told by your health care provider. Follow directions carefully. Blood pressure medicines must be taken as prescribed. Do not skip doses of blood pressure medicine. Doing this puts you at risk for problems and can make the medicine less effective. Ask your health care provider about side effects or reactions to medicines that you should watch for. Contact a health care provider if you: Think you are having a reaction to a medicine you are taking. Have headaches that keep coming back (recurring). Feel dizzy. Have swelling in your ankles. Have trouble with your vision. Get help right away if you: Develop a severe headache or confusion. Have unusual weakness or numbness. Feel faint. Have severe pain in your chest or abdomen. Vomit repeatedly. Have trouble breathing. These symptoms may be an emergency. Get help right away. Call 911. Do not wait to see if the symptoms will go away. Do not drive yourself to the hospital. Summary Hypertension is when the force of blood pumping through your arteries is too  strong. If this condition is not controlled, it may put you at risk for serious complications. Your personal target blood pressure may vary depending on your medical conditions, your age, and other factors. For most people, a normal blood pressure is less than 120/80. Hypertension is treated with lifestyle changes, medicines, or a combination of both. Lifestyle changes include losing weight, eating a healthy, low-sodium diet, exercising more, and limiting alcohol. This information is not intended to replace advice given to you by your health care provider. Make sure you discuss any questions you have with your health care provider. Document Revised: 03/16/2021 Document Reviewed: 03/16/2021 Elsevier Patient Education  2024 [employer] How to Take Your Blood Pressure Blood pressure measures how strongly your blood is pressing against the walls of your arteries. Arteries are blood vessels that carry blood from your heart throughout your body. You can take your blood pressure at home with a machine. You may need to check your blood pressure at home: To check if you have high blood pressure (hypertension). To check your blood pressure over time. To make sure your blood pressure medicine is working. Supplies needed: Blood pressure machine, or monitor. A chair to sit in. This should be a chair where you can sit upright with your  back supported. Do not sit on a soft couch or an armchair. Table or desk. Small notebook. Pencil or pen. How to prepare Avoid these things for 30 minutes before checking your blood pressure: Having drinks with caffeine in them, such as coffee or tea. Drinking alcohol. Eating. Smoking. Exercising. Do these things five minutes before checking your blood pressure: Go to the bathroom and pee (urinate). Sit in a chair. Be quiet. Do not talk. How to take your blood pressure Follow the instructions that came with your machine. If you have a digital blood pressure monitor,  these may be the instructions: Sit up straight. Place your feet on the floor. Do not cross your ankles or legs. Rest your left arm at the level of your heart. You may rest it on a table, desk, or chair. Pull up your shirt sleeve. Wrap the blood pressure cuff around the upper part of your left arm. The cuff should be 1 inch (2.5 cm) above your elbow. It is best to wrap the cuff around bare skin. Fit the cuff snugly around your arm, but not too tightly. You should be able to place only one finger between the cuff and your arm. Place the cord so that it rests in the bend of your elbow. Press the power button. Sit quietly while the cuff fills with air and loses air. Write down the numbers on the screen. Wait 2-3 minutes and then repeat steps 1-10. What do the numbers mean? Two numbers make up your blood pressure. The first number is called systolic pressure. The second is called diastolic pressure. An example of a blood pressure reading is 120 over 80 (or 120/80). If you are an adult and do not have a medical condition, use this guide to find out if your blood pressure is normal: Normal First number: below 120. Second number: below 80. Elevated First number: 120-129. Second number: below 80. Hypertension stage 1 First number: 130-139. Second number: 80-89. Hypertension stage 2 First number: 140 or above. Second number: 90 or above. Your blood pressure is above normal even if only the first or only the second number is above normal. Follow these instructions at home: Medicines Take over-the-counter and prescription medicines only as told by your doctor. Tell your doctor if your medicine is causing side effects. General instructions Check your blood pressure as often as your doctor tells you to. Check your blood pressure at the same time every day. Take your monitor to your next doctor's appointment. Your doctor will: Make sure you are using it correctly. Make sure it is working  right. Understand what your blood pressure numbers should be. Keep all follow-up visits. General tips You will need a blood pressure machine or monitor. Your doctor can suggest a monitor. You can buy one at a drugstore or online. When choosing one: Choose one with an arm cuff. Choose one that wraps around your upper arm. Only one finger should fit between your arm and the cuff. Do not choose one that measures your blood pressure from your wrist or finger. Where to find more information American Heart Association: www.heart.org Contact a doctor if: Your blood pressure keeps being high. Your blood pressure is suddenly low. Get help right away if: Your first blood pressure number is higher than 180. Your second blood pressure number is higher than 120. These symptoms may be an emergency. Do not wait to see if the symptoms will go away. Get help right away. Call 911. Summary Check your blood pressure  at the same time every day. Avoid caffeine, alcohol, smoking, and exercise for 30 minutes before checking your blood pressure. Make sure you understand what your blood pressure numbers should be. This information is not intended to replace advice given to you by your health care provider. Make sure you discuss any questions you have with your health care provider. Document Revised: 01/21/2021 Document Reviewed: 01/21/2021 Elsevier Patient Education  2024 [employer] Ingrown Toenail  An ingrown toenail occurs when the corner or sides of a toenail grow into the surrounding skin. This causes discomfort and pain. The big toe is most commonly affected, but any of the toes can be affected. If an ingrown toenail is not treated, it can become infected. What are the causes? This condition may be caused by: Wearing shoes that are too small or tight. An injury, such as stubbing your toe or having your toe stepped on. Improper cutting or care of your toenails. Having nail or foot abnormalities that  were present from birth (congenital abnormalities), such as having a nail that is too big for your toe. What increases the risk? The following factors may make you more likely to develop ingrown toenails: Age. Nails tend to get thicker with age, so ingrown nails are more common among older people. Cutting your toenails incorrectly, such as cutting them very short or cutting them unevenly. An ingrown toenail is more likely to get infected if you have: Diabetes. Blood flow (circulation) problems. What are the signs or symptoms? Symptoms of an ingrown toenail may include: Pain, soreness, or tenderness. Redness. Swelling. Hardening of the skin that surrounds the toenail. Signs that an ingrown toenail may be infected include: Fluid or pus. Symptoms that get worse. How is this diagnosed? Ingrown toenails may be diagnosed based on: Your symptoms and medical history. A physical exam. Labs or tests. If you have fluid or blood coming from your toenail, a sample may be collected to test for the specific type of bacteria that is causing the infection. How is this treated? Treatment depends on the severity of your symptoms. You may be able to care for your toenail at home. If you have an infection, you may be prescribed antibiotic medicines. If you have fluid or pus draining from your toenail, your health care provider may drain it. If you have trouble walking, you may be given crutches to use. If you have a severe or infected ingrown toenail, you may need a procedure to remove part or all of the nail. Follow these instructions at home: Foot care  Check your wound every day for signs of infection, or as often as told by your health care provider. Check for: More redness, swelling, or pain. More fluid or blood. Warmth. Pus or a bad smell. Do not pick at your toenail or try to remove it yourself. Soak your foot in warm, soapy water. Do this for 20 minutes, 3 times a day, or as often as told by  your health care provider. This helps to keep your toe clean and your skin soft. Wear shoes that fit well and are not too tight. Your health care provider may recommend that you wear open-toed shoes while you heal. Trim your toenails regularly and carefully. Cut your toenails straight across to prevent injury to the skin at the corners of the toenail. Do not cut your nails in a curved shape. Keep your feet clean and dry to help prevent infection. General instructions Take over-the-counter and prescription medicines only as told by  your health care provider. If you were prescribed an antibiotic, take it as told by your health care provider. Do not stop taking the antibiotic even if you start to feel better. If your health care provider told you to use crutches to help you move around, use them as instructed. Return to your normal activities as told by your health care provider. Ask your health care provider what activities are safe for you. Keep all follow-up visits. This is important. Contact a health care provider if: You have more redness, swelling, pain, or other symptoms that do not improve with treatment. You have fluid, blood, or pus coming from your toenail. You have a red streak on your skin that starts at your foot and spreads up your leg. You have a fever. Summary An ingrown toenail occurs when the corner or sides of a toenail grow into the surrounding skin. This causes discomfort and pain. The big toe is most commonly affected, but any of the toes can be affected. If an ingrown toenail is not treated, it can become infected. Fluid or pus draining from your toenail is a sign of infection. Your health care provider may need to drain it. You may be given antibiotics to treat the infection. Trimming your toenails regularly and properly can help you prevent an ingrown toenail. This information is not intended to replace advice given to you by your health care provider. Make sure you discuss  any questions you have with your health care provider. Document Revised: 09/08/2020 Document Reviewed: 09/08/2020 Elsevier Patient Education  2024 [employer] Dyslipidemia Dyslipidemia is an imbalance of waxy, fat-like substances (lipids) in the blood. The body needs lipids in small amounts. Dyslipidemia often involves a high level of cholesterol or triglycerides, which are types of lipids. Common forms of dyslipidemia include: High levels of LDL cholesterol. LDL is the type of cholesterol that causes fatty deposits (plaques) to build up in the blood vessels that carry blood away from the heart (arteries). Low levels of HDL cholesterol. HDL cholesterol is the type of cholesterol that protects against heart disease. High levels of HDL remove the LDL buildup from arteries. High levels of triglycerides. Triglycerides are a fatty substance in the blood that is linked to a buildup of plaques in the arteries. What are the causes? There are two main types of dyslipidemia: primary and secondary. Primary dyslipidemia is caused by changes (mutations) in genes that are passed down through families (inherited). These mutations cause several types of dyslipidemia. Secondary dyslipidemia may be caused by various risk factors that can lead to the disease, such as lifestyle choices and certain medical conditions. What increases the risk? You are more likely to develop this condition if you are an older man or if you are a woman who has gone through menopause. Other risk factors include: Having a family history of dyslipidemia. Taking certain medicines, including birth control pills, steroids, some diuretics, and beta-blockers. Eating a diet high in saturated fat. Smoking cigarettes or excessive alcohol intake. Having certain medical conditions such as diabetes, polycystic ovary syndrome (PCOS), kidney disease, liver disease, or hypothyroidism. Not exercising regularly. Being overweight or obese with too much  belly fat. What are the signs or symptoms? In most cases, dyslipidemia does not usually cause any symptoms. In severe cases, very high lipid levels can cause: Fatty bumps under the skin (xanthomas). A white or gray ring around the black center (pupil) of the eye. Very high triglyceride levels can cause inflammation of the pancreas (pancreatitis). How  is this diagnosed? Your health care provider may diagnose dyslipidemia based on a routine blood test (fasting blood test). Because most people do not have symptoms of the condition, this blood testing (lipid profile) is done on adults age 57 and older and is repeated every 4-6 years. This test checks: Total cholesterol. This measures the total amount of cholesterol in your blood, including LDL cholesterol, HDL cholesterol, and triglycerides. A healthy number is below 200 mg/dL (4.82 mmol/L). LDL cholesterol. The target number for LDL cholesterol is different for each person, depending on individual risk factors. A healthy number is usually below 100 mg/dL (7.40 mmol/L). Ask your health care provider what your LDL cholesterol should be. HDL cholesterol. An HDL level of 60 mg/dL (8.44 mmol/L) or higher is best because it helps to protect against heart disease. A number below 40 mg/dL (8.96 mmol/L) for men or below 50 mg/dL (8.70 mmol/L) for women increases the risk for heart disease. Triglycerides. A healthy triglyceride number is below 150 mg/dL (8.30 mmol/L). If your lipid profile is abnormal, your health care provider may do other blood tests. How is this treated? Treatment depends on the type of dyslipidemia that you have and your other risk factors for heart disease and stroke. Your health care provider will have a target range for your lipid levels based on this information. Treatment for dyslipidemia starts with lifestyle changes, such as diet and exercise. Your health care provider may recommend that you: Get regular exercise. Make changes to  your diet. Quit smoking if you smoke. Limit your alcohol intake. If diet changes and exercise do not help you reach your goals, your health care provider may also prescribe medicine to lower lipids. The most commonly prescribed type of medicine lowers your LDL cholesterol (statin drug). If you have a high triglyceride level, your provider may prescribe another type of drug (fibrate) or an omega-3 fish oil supplement, or both. Follow these instructions at home: Eating and drinking  Follow instructions from your health care provider or dietitian about eating or drinking restrictions. Eat a healthy diet as told by your health care provider. This can help you reach and maintain a healthy weight, lower your LDL cholesterol, and raise your HDL cholesterol. This may include: Limiting your calories, if you are overweight. Eating more fruits, vegetables, whole grains, fish, and lean meats. Limiting saturated fat, trans fat, and cholesterol. Do not drink alcohol if: Your health care provider tells you not to drink. You are pregnant, may be pregnant, or are planning to become pregnant. If you drink alcohol: Limit how much you have to: 0-1 drink a day for women. 0-2 drinks a day for men. Know how much alcohol is in your drink. In the [place] one drink equals one 12 oz bottle of beer (355 mL), one 5 oz glass of wine (148 mL), or one 1 oz glass of hard liquor (44 mL). Activity Get regular exercise. Start an exercise and strength training program as told by your health care provider. Ask your health care provider what activities are safe for you. Your health care provider may recommend: 30 minutes of aerobic activity 4-6 days a week. Brisk walking is an example of aerobic activity. Strength training 2 days a week. General instructions Do not use any products that contain nicotine or tobacco. These products include cigarettes, chewing tobacco, and vaping devices, such as e-cigarettes. If you need help  quitting, ask your health care provider. Take over-the-counter and prescription medicines only as told by your health  care provider. This includes supplements. Keep all follow-up visits. This is important. Contact a health care provider if: You are having trouble sticking to your exercise or diet plan. You are struggling to quit smoking or to control your use of alcohol. Summary Dyslipidemia often involves a high level of cholesterol or triglycerides, which are types of lipids. Treatment depends on the type of dyslipidemia that you have and your other risk factors for heart disease and stroke. Treatment for dyslipidemia starts with lifestyle changes, such as diet and exercise. Your health care provider may prescribe medicine to lower lipids. This information is not intended to replace advice given to you by your health care provider. Make sure you discuss any questions you have with your health care provider. Document Revised: 12/10/2021 Document Reviewed: 07/13/2020 Elsevier Patient Education  2025 [employer]. Basics of Medicine Management Taking your medicines correctly is an important part of managing or preventing medical problems. Make sure you know what disease or condition your medicine is treating, and how and when to take it. If you do not take your medicine correctly, it may not work well and may cause unpleasant side effects, including serious health problems. What should I do when I am taking medicines?  Read all the labels and inserts that come with your medicines. Review the information often and with each refill. Talk with your pharmacist if you get a refill and notice a change in the size, color, or shape of your medicines. Know the potential side effects for each medicine that you take. Try to get all your medicines from the same pharmacy. The pharmacist will have all your information and will understand how your medicines will affect each other (interact). Always carry an  updated list of your medicines with you. If there is an emergency, a first responder can quickly see what medicines you are taking. Tell your health care provider about all your medicines, including over-the-counter medicines, vitamins, and herbal or dietary supplements. Your health care provider will make sure that nothing will interact with any of your prescribed medicines. How can I take my medicines safely? Take medicines only as told by your health care provider. Do not take more of your medicine than instructed. Do not take anyone else's medicines. Do not share your medicines with others. Do not stop taking your medicines unless your health care provider tells you to do so. You may need to avoid alcohol or certain foods or liquids when taking certain medicines. Follow your health care provider's instructions. Do not split, cut, crush, or chew your medicines unless your health care provider tells you to do so. Tell your health care provider if you have trouble swallowing your medicines. For liquid medicine, use the dosing container that was provided. Household spoons are not accurate. How should I organize my medicines?  Know your medicines Know what each of your medicines looks like. This includes size, color, and shape. Tell your health care provider if you are having trouble recognizing all the medicines that you are taking. If you cannot tell your medicines apart because they look similar, keep them in the original bottles. If you cannot read the labels on the bottles, tell your pharmacist to put your medicines in containers with large print. Review your medicines and your schedule with family members, a friend, or a caregiver. Use a pill organizer Use a tool to organize your medicine schedule. Tools include a weekly pillbox, a written chart, a notebook, or a calendar. Your tool should help you  remember the following things about each medicine: The name of the medicine. The amount  (dose) to take. The schedule. This is the day and time the medicine should be taken. The appearance. This includes color, shape, size, and stamp. How to take your medicines. This includes instructions to take them with food, without food, with fluids, or with other medicines. Create reminders for taking your medicines. Use sticky notes, or use alarms on your watch, mobile device, or phone calendar. You may choose to use a more advanced management system. These systems have storage, alarms, and visual and audio prompts. Some medicines can be taken on an as-needed basis. These may include medicines for nausea, constipation, pain, cough and cold, allergies, and anxiety. If you take an as-needed medicine, write down the name and dose, as well as the date and time that you took it. How should I plan for travel? Take your pillbox, medicines, and organization system with you when traveling. Have your medicines refilled before you travel. This will ensure that you do not run out of your medicines while you are away from home. Always carry an updated list of your medicines with you. If there is an emergency, a first responder can quickly see what medicines you are taking. Do not pack your medicines in checked luggage in case your luggage is lost or delayed. Keep your medicines in your carry-on bag. If any of your medicines is considered a controlled substance, make sure you bring a letter from your health care provider with you. How should I store and discard my medicines? For safe storage: Store medicines in a cool, dry area away from light, or as directed by your health care provider. Do not store medicines in the bathroom. Heat and humidity will affect them. Do not store your medicines with other chemicals or with medicines for pets or other household members. Keep medicines away from children and pets. Do not leave them on counters or bedside tables. Store them in high cabinets or on high shelves. For  safe disposal: Check expiration dates regularly. Do not take expired medicines. Discard medicines that are older than the expiration date. Learn a safe way to dispose of your medicines. You may: Use a local government, hospital, or pharmacy medicine-take-back program. If you cannot return the medicine, check the label or package insert to see if the medicine should be thrown out in the garbage or flushed down the toilet. If you are not sure, ask your health care team. If it is safe to put the medicine in the trash, empty the medicine out of the container. Mix the medicine with cat litter, dirt, coffee grounds, or another unwanted substance. Seal the mixture in a bag or container. Put it in the trash. What should I remember? Tell your health care provider if you: Experience side effects. Have new symptoms. Feel that your medicine is no longer working. Have other concerns about taking your medicines. Review your medicines regularly with your health care provider. Other medicines, diet, medical conditions, weight changes, and daily habits can all affect how medicines work. Ask if you need to continue taking each medicine, and discuss how well each one is working. Refill your medicines early to avoid running out of them. In case of an accidental overdose, call your local poison control center at [phone] or go to your local emergency department right away. Summary Taking your medicines correctly is an important part of managing or preventing medical problems. You need to make sure that you understand  what you are taking a medicine for, as well as how and when you need to take it. Use a tool to organize your medicine schedule. Tools include a weekly pillbox, a written chart, a notebook, or a calendar. In case of an accidental overdose, call your local Poison Control Center at [phone] or go to your local emergency department right away. This information is not intended to replace advice  given to you by your health care provider. Make sure you discuss any questions you have with your health care provider. Document Revised: 12/15/2020 Document Reviewed: 12/15/2020 Elsevier Patient Education  2024 Else[employer] Inc. Vitamin D Deficiency: What to Know Vitamin D deficiency is when your body doesn't have enough vitamin D. Vitamin D is important because: It helps the body to maintain calcium  and phosphorus levels. It helps to keep your bones healthy. Not getting enough vitamin D can make your bones soft. It reduces irritation or inflammation in the body. It helps the body's defense system (immune system) work better. What are the causes? Not eating enough foods that have vitamin D in them. Not getting enough sun. Having diseases that make it hard for your body to take in vitamin D. Having had part of your stomach or part of your small intestine taken out. What increases the risk? Being an older adult. Not spending much time outdoors. Living in a long-term care center. Having dark skin. Taking certain medicines, such as steroids or seizure medicines. Being overweight or very overweight (obese). Having long-term (chronic) kidney or liver disease. What are the signs or symptoms? Pain in the bones. Pain in the muscles. Not being able to walk normally. Bones that break easily. Joint pain. How is this diagnosed? This condition may be diagnosed with blood tests. Imaging tests such as X-rays may also be done to look for weakness in the bone. How is this treated? Treatment may include taking supplements. You may be told to take vitamin D or calcium .  Your health care provider will tell you what dose is best for you. Follow these instructions at home: Eating and drinking Eat foods that contain vitamin D, such as: Dairy products, cereals, or juices that have vitamin D added to them. Check the label on the package to see if vitamin D was added to your food. Fish, such as salmon or  trout. Eggs. The vitamin D is in the yolk. Mushrooms that were treated with UV light. Beef liver. The items listed above may not be all the foods and drinks that have vitamin D. Talk with a dietitian to learn more. General instructions Take your medicines only as told. Take supplements only as told. Get regular, safe exposure to natural sunlight. Do not use a tanning bed. Maintain a healthy weight. Lose weight if needed. Contact a health care provider if: Your symptoms do not go away. You throw up or feel like you may throw up. You have trouble pooping (constipation), or you poop less than usual. This information is not intended to replace advice given to you by your health care provider. Make sure you discuss any questions you have with your health care provider. Document Revised: 09/25/2023 Document Reviewed: 07/28/2023 Elsevier Patient Education  2025 [employer].  Urinary Tract Infection, Female A urinary tract infection (UTI) is an infection in your urinary tract. The urinary tract is made up of organs that make, store, and get rid of pee (urine) in your body. These organs include: The kidneys. The ureters. The bladder. The urethra. What are  the causes? Most UTIs are caused by germs called bacteria. They may be in or near your genitals. These germs grow and cause swelling in your urinary tract. What increases the risk? You're more likely to get a UTI if: You're a female. The urethra is shorter in females than in males. You have a soft tube called a catheter that drains your pee. You can't control when you pee or poop. You have trouble peeing because of: A kidney stone. A urinary blockage. A nerve condition that affects your bladder. Not getting enough to drink. You're sexually active. You use a birth control inside your vagina, like spermicide. You're pregnant. You have low levels of the hormone estrogen in your body. You're an older adult. You're also more likely to  get a UTI if you have other health problems. These may include: Diabetes. A weak immune system. Your immune system is your body's defense system. Sickle cell disease. Injury of the spine. What are the signs or symptoms? Symptoms may include: Needing to pee right away. Peeing small amounts often. Pain or burning when you pee. Blood in your pee. Pee that smells bad or odd. Pain in your belly or lower back. You may also: Feel confused. This may be the first symptom in older adults. Vomit. Not feel hungry. Feel tired or easily annoyed. Have a fever or chills. How is this diagnosed? A UTI is diagnosed based on your medical history and an exam. You may also have other tests. These may include: Pee tests. Blood tests. Tests for sexually transmitted infections (STIs). If you've had more than one UTI, you may need to have imaging studies done to find out why you keep getting them. How is this treated? A UTI can be treated by: Taking antibiotics or other medicines. Drinking enough fluid to keep your pee pale yellow. In rare cases, a UTI can cause a very bad condition called sepsis. Sepsis may be treated in the hospital. Follow these instructions at home: Medicines Take your medicines only as told by your health care provider. If you were given antibiotics, take them as told by your provider. Do not stop taking them even if you start to feel better. General instructions Make sure you: Pee often and fully. Do not hold your pee for a long time. Wipe from front to back after you pee or poop. Use each tissue only once when you wipe. Pee after you have sex. Do not douche or use sprays or powders in your genital area. Contact a health care provider if: Your symptoms don't get better after 1-2 days of taking antibiotics. Your symptoms go away and then come back. You have a fever or chills. You vomit or feel like you may vomit. Get help right away if: You have very bad pain in your back or  lower belly. You faint. This information is not intended to replace advice given to you by your health care provider. Make sure you discuss any questions you have with your health care provider. Document Revised: 04/19/2023 Document Reviewed: 08/12/2022 Elsevier Patient Education  [employer].  The above assessment and management plan was discussed with the patient. The patient verbalized understanding of and has agreed to the management plan. Patient is aware to call the clinic if they develop any new symptoms or if symptoms persist or worsen. Patient is aware when to return to the clinic for a follow-up visit. Patient educated on when it is appropriate to go to the emergency department.  Nena  [place] Thompson, DNP [facility] [address] [place], [place] [address] [phone]

## 2024-02-19 ENCOUNTER — Encounter: Payer: Self-pay | Admitting: Nurse Practitioner

## 2024-02-19 ENCOUNTER — Ambulatory Visit: Payer: Self-pay | Admitting: Nurse Practitioner

## 2024-02-19 ENCOUNTER — Ambulatory Visit: Admitting: Nurse Practitioner

## 2024-02-19 VITALS — BP 130/77 | HR 60 | Temp 97.1°F | Ht 61.0 in | Wt 122.4 lb

## 2024-02-19 DIAGNOSIS — N3001 Acute cystitis with hematuria: Secondary | ICD-10-CM | POA: Insufficient documentation

## 2024-02-19 DIAGNOSIS — L84 Corns and callosities: Secondary | ICD-10-CM | POA: Insufficient documentation

## 2024-02-19 DIAGNOSIS — E559 Vitamin D deficiency, unspecified: Secondary | ICD-10-CM | POA: Diagnosis not present

## 2024-02-19 DIAGNOSIS — L600 Ingrowing nail: Secondary | ICD-10-CM | POA: Insufficient documentation

## 2024-02-19 DIAGNOSIS — E1140 Type 2 diabetes mellitus with diabetic neuropathy, unspecified: Secondary | ICD-10-CM | POA: Diagnosis not present

## 2024-02-19 DIAGNOSIS — R300 Dysuria: Secondary | ICD-10-CM | POA: Insufficient documentation

## 2024-02-19 DIAGNOSIS — Z794 Long term (current) use of insulin: Secondary | ICD-10-CM

## 2024-02-19 DIAGNOSIS — Z23 Encounter for immunization: Secondary | ICD-10-CM | POA: Diagnosis not present

## 2024-02-19 DIAGNOSIS — E782 Mixed hyperlipidemia: Secondary | ICD-10-CM | POA: Diagnosis not present

## 2024-02-19 DIAGNOSIS — R4189 Other symptoms and signs involving cognitive functions and awareness: Secondary | ICD-10-CM

## 2024-02-19 DIAGNOSIS — I10 Essential (primary) hypertension: Secondary | ICD-10-CM | POA: Diagnosis not present

## 2024-02-19 LAB — URINALYSIS, ROUTINE W REFLEX MICROSCOPIC
Bilirubin, UA: NEGATIVE
Ketones, UA: NEGATIVE
Nitrite, UA: NEGATIVE
Protein,UA: NEGATIVE
Specific Gravity, UA: 1.025 (ref 1.005–1.030)
Urobilinogen, Ur: 0.2 mg/dL (ref 0.2–1.0)
pH, UA: 5.5 (ref 5.0–7.5)

## 2024-02-19 LAB — BAYER DCA HB A1C WAIVED: HB A1C (BAYER DCA - WAIVED): 8.8 % — ABNORMAL HIGH (ref 4.8–5.6)

## 2024-02-19 LAB — MICROSCOPIC EXAMINATION
Renal Epithel, UA: NONE SEEN /HPF
Yeast, UA: NONE SEEN

## 2024-02-19 MED ORDER — SULFAMETHOXAZOLE-TRIMETHOPRIM 800-160 MG PO TABS: 1.0000 | ORAL_TABLET | Freq: Two times a day (BID) | ORAL | 0 refills | Status: AC

## 2024-02-19 MED ORDER — DAPAGLIFLOZIN PROPANEDIOL 5 MG PO TABS: 5.0000 mg | ORAL_TABLET | Freq: Every day | ORAL | 0 refills | Status: DC

## 2024-02-20 ENCOUNTER — Telehealth: Payer: Self-pay | Admitting: Nurse Practitioner

## 2024-02-20 NOTE — Telephone Encounter (Signed)
 Noted

## 2024-02-20 NOTE — Telephone Encounter (Signed)
 Pt daughter on DPR given lab results per notes of S. Deitra Morton Hummer, NP from 02/19/24 on 02/20/24. Pt daughter  verbalized understanding and aware medication Bactrim DS sent to pharmacy for patient.     Copied from CRM [id number]. Topic: Clinical - Lab/Test Results >> Feb 20, 2024  9:45 AM Gustabo D wrote: Lab results

## 2024-03-07 DIAGNOSIS — M79675 Pain in left toe(s): Secondary | ICD-10-CM | POA: Diagnosis not present

## 2024-03-07 DIAGNOSIS — L110 Acquired keratosis follicularis: Secondary | ICD-10-CM | POA: Diagnosis not present

## 2024-03-07 DIAGNOSIS — I739 Peripheral vascular disease, unspecified: Secondary | ICD-10-CM | POA: Diagnosis not present

## 2024-03-07 DIAGNOSIS — M79672 Pain in left foot: Secondary | ICD-10-CM | POA: Diagnosis not present

## 2024-03-07 DIAGNOSIS — E1140 Type 2 diabetes mellitus with diabetic neuropathy, unspecified: Secondary | ICD-10-CM | POA: Diagnosis not present

## 2024-03-07 DIAGNOSIS — M79674 Pain in right toe(s): Secondary | ICD-10-CM | POA: Diagnosis not present

## 2024-03-07 DIAGNOSIS — M79671 Pain in right foot: Secondary | ICD-10-CM | POA: Diagnosis not present

## 2024-03-21 DIAGNOSIS — M79674 Pain in right toe(s): Secondary | ICD-10-CM | POA: Diagnosis not present

## 2024-03-21 DIAGNOSIS — L110 Acquired keratosis follicularis: Secondary | ICD-10-CM | POA: Diagnosis not present

## 2024-03-21 DIAGNOSIS — M79672 Pain in left foot: Secondary | ICD-10-CM | POA: Diagnosis not present

## 2024-03-21 DIAGNOSIS — M79675 Pain in left toe(s): Secondary | ICD-10-CM | POA: Diagnosis not present

## 2024-03-21 DIAGNOSIS — I739 Peripheral vascular disease, unspecified: Secondary | ICD-10-CM | POA: Diagnosis not present

## 2024-03-21 DIAGNOSIS — M79671 Pain in right foot: Secondary | ICD-10-CM | POA: Diagnosis not present

## 2024-03-21 DIAGNOSIS — E1140 Type 2 diabetes mellitus with diabetic neuropathy, unspecified: Secondary | ICD-10-CM | POA: Diagnosis not present

## 2024-03-29 ENCOUNTER — Encounter: Payer: Self-pay | Admitting: *Deleted

## 2024-04-08 ENCOUNTER — Other Ambulatory Visit: Payer: Self-pay | Admitting: Nurse Practitioner

## 2024-04-08 DIAGNOSIS — E1140 Type 2 diabetes mellitus with diabetic neuropathy, unspecified: Secondary | ICD-10-CM

## 2024-04-08 MED ORDER — ATORVASTATIN CALCIUM 10 MG PO TABS: 10.0000 mg | ORAL_TABLET | Freq: Every day | ORAL | 0 refills | Status: DC

## 2024-04-13 ENCOUNTER — Other Ambulatory Visit: Payer: Self-pay | Admitting: Nurse Practitioner

## 2024-04-13 DIAGNOSIS — I10 Essential (primary) hypertension: Secondary | ICD-10-CM

## 2024-05-08 ENCOUNTER — Telehealth: Payer: Self-pay

## 2024-05-08 NOTE — Telephone Encounter (Signed)
 Patient was identified as falling into the [place] Measure - Diabetes.   Patient was: Appointment already scheduled for:  1/6/226.

## 2024-05-10 ENCOUNTER — Telehealth: Payer: Self-pay

## 2024-05-10 NOTE — Telephone Encounter (Signed)
 Patient was identified as falling into the [place] Measure - Diabetes.   Patient was: Appointment already scheduled for:  05/30/24.

## 2024-05-13 NOTE — Progress Notes (Incomplete)
 "     Robyn [name] is a very pleasant 89 y.o. RH female with a history of HTN, HLD, DM2, vitamin D deficiency, Lewy body dementia with behavioral disturbance seen today in follow up for memory loss. Patient is not currently on antidementia medication per daughter's request as the risks outweigh the benefit.  She is on Depakote  250 mg at night and 125 mg during the day as needed.***.  This patient is accompanied in the office by her daughter*** who supplements the history.  Previous records as well as any outside records available were reviewed prior to todays visit. Patient was last seen on 11/10/2023***. Memory is **. MMSE today is  /30. Patient is able to participate on ADLs needing more surveillant's at night.   Lewy body dementia with behavioral disturbance Follow-up in 6 months Continue Depakote  250 mg at night, may add 125 mg dose during the day as needed, side effects discussed*** Recommended control of cardiovascular risk factors Continue to control mood as per PCP  Discussed the use of AI scribe software for clinical note transcription with the patient, who gave verbal consent to proceed.  History of Present Illness    Any changes in memory since last visit? She has been more forgetful and cannot retrieve the words, processing. She has more difficulty  to remembering conversations and names.  Sometimes she feels that the TV personalities are not real to her.  repeats oneself?  Endorsed Disoriented when walking into a room? Denies, but cannot remember the address she lives in.   Leaving objects?  May misplace things especially the phone but not in unusual places   Wandering behavior?  denies   Any personality changes since last visit?  A little more agitated-daughter says.  Sometimes she tries to hit people with objects. Any worsening depression?:  Denies.   Hallucinations or paranoia?  She continues to talk to  they or them .  Occasionally she feels that there are people  from the outside that want to come inside the house or someone taking her purse.  She may see a snake and water entering the room. Seizures? denies    Any sleep changes?  Sleeps well, but does have vivid dreams, denies REM behavior or sleepwalking   Sleep apnea?   Denies.   Any hygiene concerns?  She does not want any help but she needs reminder to shower . Independent of bathing and dressing?  Endorsed  Does the patient needs help with medications?  Daughter is in charge   Who is in charge of the finances?  Daughter is in charge     Any changes in appetite?  Somewhat decreased, has lost a few pounds since last visit.  This is followed by her PCP. Patient have trouble swallowing? Denies.   Does the patient cook? No Any headaches?   denies   Any vision changes?  Chronic back pain  denies   Ambulates with difficulty?  She refuses to use a cane for stability. She feels that someone is behind her touching her back Recent falls or head injuries? Denies.     Unilateral weakness, numbness or tingling? denies   Any tremors?  Denies    Any anosmia?  Denies   Any incontinence of urine?  She has a history of stress incontinence and does not like to wear any diapers or pads.  She had a few UTIs in the past. Any bowel dysfunction?   Denies      Patient lives alone  in [place] , her daughter monitors her.  Daughter is considering assisted living or home health nursing.  She cannot be left alone, her daughter sister very frequently during the day. Does the patient drive? No longer drives    HISTORY OF PRESENT ILLNESS 06/05/20: This is an 89 year old right-handed woman with a history of hypertension, hyperlipidemia, diabetes, dementia, presenting for evaluation of dementia. On her PCP appointment in October 2021, daughter reported she is often disoriented to place and time, and hears voices telling her what to do. She lives alone and her daughters check on her multiple times per week and help  with housework. She was evaluated by neurologist Dr. Vear for similar symptoms in 2016. Symptoms had started around 2014. At that time, hallucinations were primarily auditory but sometimes visual. She also reported sleep difficulty with the voices speaking to her when she tries to fall asleep. She had side effects of nausea on Donepezil  and was prescribed Memantine and Seroquel. MOCA 26/30 in 11/2014. Etiology of symptoms at that time unclear, she did not have features of LBD or AD. She was started on hydroxyzine  then was lost to follow-up.   Over the years, Robyn [name] reports that the auditory hallucinations have worsened. The voices would tell her to go outside sometimes, as far as Robyn [name] knows she has not wandered outside. She reports heaviness in her truncal region, that she has gained weight, Robyn [name] states she thinks the people are causing the heaviness behind her. The voices tell her that her laundry is not worth keeping. When they are walking, she wants to go a different way because the voices are telling her to go that way. She would offer food when she is eating. She goes to the bathroom and would be heard saying leave her alone. She would stand and stare, and when asked what she is doing, she says she is talking to them. Initially she stated she does not hear anything, then Robyn [name] reminds her it occurs all the time, she talks to them and waves while she is at [employer]. She states they are in a group, in her house, and can be scary for her, keeping her up at night. She only gets 1-2 hours of sleep at night and naps in the day. She recalls Seroquel (and maybe hydroxyzine ) causing dizziness in the past. She says she took these for a long time, but was lost to follow-up.    Robyn [name] thinks her memory is okay. She continues to live alone. She states her memory is sometimes not very well. She does not drive. She continues to manage her own medications and finances, they deny any issues. She denies leaving the stove on. She  has occasional word-finding difficulties. There is no clear REM behavior disorder. She has occasional hand tremors. She always feel like her balance is off, no falls. She denies any headaches, diplopia, dysarthria/dysphagia, neck pain, focal numbness/tingling/weakness, bowel/bladder dysfunction, anosmia. There is no family history of dementia, no history of significant head injuries or alcohol use.       I personally reviewed head CTs done over the years, most recently in 06/2019 which showed moderate diffuse atrophy, there appears to be more in the right frontal and occipital regions.   Neurocognitive testing 10/28/2020 Dr. Jackquline Ms. Cumberledge demonstrated impaired performance on measures of processing speed, executive function, and her visuospatial and constructional functioning fell at an unusually low level, suggesting some mild difficulties. She also had marked naming problems and diminished phonemic as compared  to semantic fluency. Memory performance was notable for low encoding of information but she retained verbal information well and does not appear to have a frank storage problem at this time. She did well on measures of attention/working memory. Her CDR places her in the mild dementia range based on the history of provided and her objective test performance, although her daughter characterized her as functioning at a moderate dementia level. It is possible that movement or other issues are confounding her rating or perhaps the patient simply tests well. She did score a 4/4 on the Mayo Fluctuations Questionnaire.   Ms. Mccleese is thus demonstrating a dementia level problem with primary deficits on measures of processing speed, executive functioning, and naming and she has some additional visuospatial/constructional issues. 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 Her test data  are weakly supportive although her visuospatial functioning is less affected than might be expected. A mixed pathological picture is also possible, although her test data are equivocal for AD (has naming problems but no memory storage problem       05/12/2023    5:00 PM 10/20/2022    3:00 PM 04/08/2022    1:00 PM  MMSE - Mini Mental State Exam  Orientation to time 3 4 5   Orientation to Place 2 5 5   Registration 3 3 3   Attention/ Calculation 5 5 5   Recall 3 1 3   Language- name 2 objects 2 2 2   Language- repeat 1 1 1   Language- follow 3 step command 3 3 3   Language- read & follow direction 1 1 1   Write a sentence 1 1 1   Copy design 0 1 1  Total score 24 27 30       12/02/2014   10:00 AM  Montreal Cognitive Assessment   Visuospatial/ Executive (0/5) 3  Naming (0/3) 3  Attention: Read list of digits (0/2) 2  Attention: Read list of letters (0/1) 1  Attention: Serial 7 subtraction starting at 100 (0/3) 3  Language: Repeat phrase (0/2) 2  Language : Fluency (0/1) 0  Abstraction (0/2) 2  Delayed Recall (0/5) 3  Orientation (0/6) 6  Total 25  Adjusted Score (based on education) 25      Objective:    Neurological Exam:    VITALS:  There were no vitals filed for this visit.  GEN:  The patient appears stated age and is in NAD. HEENT:  Normocephalic, atraumatic.   Neurological examination:  General: NAD, well-groomed, appears stated age. Orientation: The patient is alert. Oriented to person, place and not to date Cranial nerves: There is good facial symmetry.The speech is fluent and clear. No aphasia or dysarthria. Fund of knowledge is appropriate. Recent and remote memory are impaired. Attention and concentration are reduced. Able to name objects and repeat phrases.  Hearing is intact to conversational tone. *** Sensation: Sensation is intact to light touch throughout Motor: Strength is at least antigravity x4. DTR's 2/4 in UE/LE     Movement examination:  Tone: There  is normal tone in the UE/LE Abnormal movements:  no tremor.  No myoclonus.  No asterixis.   Coordination:  There is no decremation with RAM's. Normal finger to nose  Gait and Station: The patient has some difficulty arising out of a deep-seated chair without the use of the hands. The patient's stride length is short.  Gait is cautious and narrow.    Thank you for  allowing us  the opportunity to participate in the care of this nice patient. Please do not hesitate to contact us  for any questions or concerns.   Total time spent on today's visit was *** minutes dedicated to this patient today, preparing to see patient, examining the patient, ordering tests and/or medications and counseling the patient, documenting clinical information in the EHR or other health record, independently interpreting results and communicating results to the patient/family, discussing treatment and goals, answering patient's questions and coordinating care.  Cc:  Deitra Morton Sebastian Nena, NP  Camie Sevin 05/13/2024 5:32 AM      "

## 2024-05-14 ENCOUNTER — Ambulatory Visit: Admitting: Physician Assistant

## 2024-05-14 DIAGNOSIS — E1140 Type 2 diabetes mellitus with diabetic neuropathy, unspecified: Secondary | ICD-10-CM

## 2024-05-17 NOTE — Progress Notes (Signed)
 "     Robyn [name] is a very pleasant 89 y.o. RH female with a history of HTN, HLD, DM2, vitamin D deficiency, Lewy body dementia with behavioral disturbance seen today in follow up for memory loss. Patient is not currently on antidementia medication per daughter's request as the risks of the medications outweigh the benefit.  She is on Depakote  125 mg daily, as daughter is concerned about worsening memory with the medicine if given at a higher dose. We discussed  changing to a very low-dose Quetiapine  or explore SSRIs/Mirtazapine for mood/agitation as well.  Patient was last seen on 11/10/2023. Memory decline is noted. She requires 24/7 supervision. Unfortunately, her daughter cannot provide care at all times as she has health issues herself. We discussed the role of memory care facilities for safety, cognitive ans social stimulation. She is to discuss with her PCP and Social Worker anticipating that her overall status may worsen in the future.    Lewy body dementia with behavioral disturbance   Follow up as needed, as daughter is interested in palliative approach, safety.  Continue Depakote  125 mg  daily, side effects discussed.  Recommended control of cardiovascular risk factors Continue to control mood as per PCP Recommend memory care option as patient lives alone and she needs 24/7 supervision and for social and cognitive stimulation.   Discussed the use of AI scribe software for clinical note transcription with the patient, who gave verbal consent to proceed.  History of Present Illness Robyn [name] is an 89 year old female with Lewy body dementia who presents with worsening cognitive symptoms. Her daughter is her primary caregiver.  Since her last visit she has experienced significant cognitive decline, including increased forgetfulness, difficulty with word retrieval, and trouble remembering conversations and names. She sometimes believes TV personalities are real and  experiences worsening hallucinations. She often does not recognize her food and frequently asks what she is eating. She is confused about dates and days, and her ability to remember her address has declined, requiring her to write it down.  Her physical symptoms include a change in gait, with a tendency to lean to the right or flex forward while walking, occurring intermittently over the past three months. She occasionally misplaces items like her phone but usually knows where it is. She has no wandering tendencies but expresses a desire not to be alone. Her mood has not worsened, but she continues to talk about past events and hallucinations.  She takes Depakote , 125 mg once daily. She has had urinary tract infections in the past, which have exacerbated her hallucinations.  Her sleep is inconsistent, sometimes staying up all night. She requires assistance with showering but is resistant to it. Her appetite has decreased, and she has lost significant weight, dropping from 130 lbs to 117 lbs. She lives alone, but her daughter visits multiple times a day to assist her.  She experiences some incontinence and refuses to wear diapers or pads. She has had a few urinary tract infections over the past three years. Her daughter manages her medications and finances.   HISTORY OF PRESENT ILLNESS 06/05/20: This is an 89 year old right-handed woman with a history of hypertension, hyperlipidemia, diabetes, dementia, presenting for evaluation of dementia. On her PCP appointment in October 2021, daughter reported she is often disoriented to place and time, and hears voices telling her what to do. She lives alone and her daughters check on her multiple times per week and help with housework. She  was evaluated by neurologist Dr. Vear for similar symptoms in 2016. Symptoms had started around 2014. At that time, hallucinations were primarily auditory but sometimes visual. She also reported sleep difficulty with the voices  speaking to her when she tries to fall asleep. She had side effects of nausea on Donepezil  and was prescribed Memantine and Seroquel. MOCA 26/30 in 11/2014. Etiology of symptoms at that time unclear, she did not have features of LBD or AD. She was started on hydroxyzine  then was lost to follow-up.   Over the years, Robyn [name] reports that the auditory hallucinations have worsened. The voices would tell her to go outside sometimes, as far as [name] knows she has not wandered outside. She reports heaviness in her truncal region, that she has gained weight, Robyn [name] states she thinks the people are causing the heaviness behind her. The voices tell her that her laundry is not worth keeping. When they are walking, she wants to go a different way because the voices are telling her to go that way. She would offer food when she is eating. She goes to the bathroom and would be heard saying leave her alone. She would stand and stare, and when asked what she is doing, she says she is talking to them. Initially she stated she does not hear anything, then Robyn [name] reminds her it occurs all the time, she talks to them and waves while she is at [employer]. She states they are in a group, in her house, and can be scary for her, keeping her up at night. She only gets 1-2 hours of sleep at night and naps in the day. She recalls Seroquel (and maybe hydroxyzine ) causing dizziness in the past. She says she took these for a long time, but was lost to follow-up.    Robyn [name] thinks her memory is okay. She continues to live alone. She states her memory is sometimes not very well. She does not drive. She continues to manage her own medications and finances, they deny any issues. She denies leaving the stove on. She has occasional word-finding difficulties. There is no clear REM behavior disorder. She has occasional hand tremors. She always feel like her balance is off, no falls. She denies any headaches, diplopia, dysarthria/dysphagia, neck pain, focal  numbness/tingling/weakness, bowel/bladder dysfunction, anosmia. There is no family history of dementia, no history of significant head injuries or alcohol use.       I personally reviewed head CTs done over the years, most recently in 06/2019 which showed moderate diffuse atrophy, there appears to be more in the right frontal and occipital regions.   Neurocognitive testing 10/28/2020 Dr. Jackquline Ms. Hoar demonstrated impaired performance on measures of processing speed, executive function, and her visuospatial and constructional functioning fell at an unusually low level, suggesting some mild difficulties. She also had marked naming problems and diminished phonemic as compared to semantic fluency. Memory performance was notable for low encoding of information but she retained verbal information well and does not appear to have a frank storage problem at this time. She did well on measures of attention/working memory. Her CDR places her in the mild dementia range based on the history of provided and her objective test performance, although her daughter characterized her as functioning at a moderate dementia level. It is possible that movement or other issues are confounding her rating or perhaps the patient simply tests well. She did score a 4/4 on the Mayo Fluctuations Questionnaire.   Ms. Sahota is thus demonstrating a dementia level problem  with primary deficits on measures of processing speed, executive functioning, and naming and she has some additional visuospatial/constructional issues. 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 Her test data are weakly supportive although her visuospatial functioning is less affected than might be expected. A mixed pathological picture is also possible, although her test data are equivocal for AD (has naming problems but no memory storage  problem       05/12/2023    5:00 PM 10/20/2022    3:00 PM 04/08/2022    1:00 PM  MMSE - Mini Mental State Exam  Orientation to time 3 4 5   Orientation to Place 2 5 5   Registration 3 3 3   Attention/ Calculation 5 5 5   Recall 3 1 3   Language- name 2 objects 2 2 2   Language- repeat 1 1 1   Language- follow 3 step command 3 3 3   Language- read & follow direction 1 1 1   Write a sentence 1 1 1   Copy design 0 1 1  Total score 24 27 30       12/02/2014   10:00 AM  Montreal Cognitive Assessment   Visuospatial/ Executive (0/5) 3  Naming (0/3) 3  Attention: Read list of digits (0/2) 2  Attention: Read list of letters (0/1) 1  Attention: Serial 7 subtraction starting at 100 (0/3) 3  Language: Repeat phrase (0/2) 2  Language : Fluency (0/1) 0  Abstraction (0/2) 2  Delayed Recall (0/5) 3  Orientation (0/6) 6  Total 25  Adjusted Score (based on education) 25      Objective:    Neurological Exam:    VITALS:   Vitals:   05/20/24 1415 05/20/24 1500  BP: (!) 181/75 (!) 170/80  Pulse: 98   Resp: 20   SpO2: 98%   Weight: 117 lb (53.1 kg)     GEN:  The patient appears stated age and is in NAD. HEENT:  Normocephalic, atraumatic.   Neurological examination:  General: NAD, well-groomed, appears stated age. Orientation: The patient is alert. Oriented to person, not to place or date Cranial nerves: There is good facial symmetry.The speech is fluent and clear. No aphasia or dysarthria. Fund of knowledge is reduced. Recent and remote memory are impaired. Attention and concentration are reduced. Able to name objects and repeat phrases.  Hearing is decreased  to conversational tone.   Sensation: Sensation is intact to light touch throughout Motor: Strength is at least antigravity x4. DTR's 2/4 in UE/LE     Movement examination:  Tone: There is normal tone in the UE/LE Abnormal movements:  no tremor.  No myoclonus.  No asterixis.   Coordination:  There is no decremation with  RAM's. Normal finger to nose  Gait and Station: The patient has some difficulty arising out of a deep-seated chair without the use of the hands. The patient's stride length is short.  Gait is cautious and narrow.    Thank you for allowing us  the opportunity to participate in the care of this nice patient. Please do not hesitate to contact us  for any questions or concerns.   Total time spent on today's visit was 33 minutes dedicated to this patient today, preparing to see patient, examining the patient, ordering tests and/or medications and counseling the patient, documenting clinical information in the EHR or other health record, independently interpreting results and communicating results to the patient/family, discussing treatment and goals, answering patient's questions  and coordinating care.  Cc:  [address] NP  Camie Sevin 05/20/2024 4:47 PM      "

## 2024-05-20 ENCOUNTER — Ambulatory Visit: Payer: Self-pay | Admitting: Nurse Practitioner

## 2024-05-20 ENCOUNTER — Encounter: Payer: Self-pay | Admitting: Physician Assistant

## 2024-05-20 ENCOUNTER — Ambulatory Visit (INDEPENDENT_AMBULATORY_CARE_PROVIDER_SITE_OTHER): Admitting: Physician Assistant

## 2024-05-20 VITALS — BP 170/80 | HR 98 | Resp 20 | Wt 117.0 lb

## 2024-05-20 DIAGNOSIS — F02818 Dementia in other diseases classified elsewhere, unspecified severity, with other behavioral disturbance: Secondary | ICD-10-CM

## 2024-05-20 DIAGNOSIS — G3183 Dementia with Lewy bodies: Secondary | ICD-10-CM

## 2024-05-27 ENCOUNTER — Ambulatory Visit: Admitting: Nurse Practitioner

## 2024-05-28 ENCOUNTER — Encounter: Payer: Self-pay | Admitting: Nurse Practitioner

## 2024-05-28 ENCOUNTER — Telehealth: Payer: Self-pay | Admitting: Nurse Practitioner

## 2024-05-28 ENCOUNTER — Ambulatory Visit (INDEPENDENT_AMBULATORY_CARE_PROVIDER_SITE_OTHER): Admitting: Nurse Practitioner

## 2024-05-28 VITALS — BP 109/67 | HR 66 | Temp 98.1°F | Ht 61.0 in | Wt 116.0 lb

## 2024-05-28 DIAGNOSIS — F02818 Dementia in other diseases classified elsewhere, unspecified severity, with other behavioral disturbance: Secondary | ICD-10-CM | POA: Diagnosis not present

## 2024-05-28 DIAGNOSIS — J301 Allergic rhinitis due to pollen: Secondary | ICD-10-CM

## 2024-05-28 DIAGNOSIS — E1140 Type 2 diabetes mellitus with diabetic neuropathy, unspecified: Secondary | ICD-10-CM | POA: Diagnosis not present

## 2024-05-28 DIAGNOSIS — I10 Essential (primary) hypertension: Secondary | ICD-10-CM

## 2024-05-28 DIAGNOSIS — L600 Ingrowing nail: Secondary | ICD-10-CM

## 2024-05-28 DIAGNOSIS — Z794 Long term (current) use of insulin: Secondary | ICD-10-CM

## 2024-05-28 DIAGNOSIS — G3183 Dementia with Lewy bodies: Secondary | ICD-10-CM

## 2024-05-28 LAB — BAYER DCA HB A1C WAIVED: HB A1C (BAYER DCA - WAIVED): 8.5 % — ABNORMAL HIGH (ref 4.8–5.6)

## 2024-05-28 MED ORDER — ATORVASTATIN CALCIUM 10 MG PO TABS: 10.0000 mg | ORAL_TABLET | Freq: Every day | ORAL | 0 refills | Status: AC

## 2024-05-28 MED ORDER — LEVOCETIRIZINE DIHYDROCHLORIDE 5 MG PO TABS: 2.5000 mg | ORAL_TABLET | Freq: Every evening | ORAL | 0 refills | Status: AC

## 2024-05-28 MED ORDER — DAPAGLIFLOZIN PROPANEDIOL 10 MG PO TABS: 10.0000 mg | ORAL_TABLET | Freq: Every day | ORAL | 0 refills | Status: AC

## 2024-05-28 NOTE — Progress Notes (Signed)
 Robyn [name] is being seen today for 2 months follow-up for chronic disease right    Subjective:  Patient ID: Robyn [name], female    DOB: [birth year], 89 y.o.   MRN: [id number]  Patient Care Team: Deitra Morton Hummer, Nena, NP as PCP - General (Nurse Practitioner) Georjean Darice HERO, MD as Consulting Physician (Neurology) Silva Juliene SAUNDERS, DPM as Consulting Physician (Podiatry) Dina Camie FORBES RIGGERS (Neurology)   Chief Complaint:  Diabetes (checked), Weight Loss (Losing a lot of weight. Does not have a good appetite.), and Neurologic Problem (They released her. Said they couldn't due much more. They recommend 24/7 care at a memory unit.  )   HPI: Robyn [name] is a 89 y.o. female presenting on 05/28/2024 for Diabetes (checked), Weight Loss (Losing a lot of weight. Does not have a good appetite.), and Neurologic Problem (They released her. Said they couldn't due much more. They recommend 24/7 care at a memory unit.  )   Discussed the use of AI scribe software for clinical note transcription with the patient, who gave verbal consent to proceed.  History of Present Illness is an 89 year old African-American female seen today for chronic disease managementThelma Ionia [name] is an 89 year old female with dementia with Lewy body who presents for a three-month follow-up for chronic disease management.  Her daughter reports a decline in her mental health status, with increased wandering behavior. She was last seen by neurology on May 20, 2024, and was discharged due to declining mental status. Her daughter is seeking a long-term placement for her as she cannot provide safe care at home due to space constraints and her own health concerns.  She has diabetes with her Last A1c 02/19/24 8.8 % and today's A1c 8.5%  She has hypertension and is currently taking lisinopril , Zestoretic , 10-25 mg.  She is on Lipitor 10 mg daily for hyperlipidemia and denies any side effects from the  medication.  For behavioral issues, she is prescribed Depakote  125 mg BID, but her daughter reports she only takes it once a day.  She is also taking Xyzal  5 mg at bedtime.    Relevant past medical, surgical, family, and social history reviewed and updated as indicated.  Allergies and medications reviewed and updated. Data reviewed: Chart in Epic.   Past Medical History:  Diagnosis Date   Coronary artery disease    Dementia (HCC)    Diabetes mellitus    Glaucoma    Hallucination    Heart disease    History of heart artery stent 2009   Hypertension    Memory loss    just started on aricept    Mild cognitive impairment    Osteoarthritis    Phobia    Psychosis (HCC)    Stroke (HCC)    Vision abnormalities     Past Surgical History:  Procedure Laterality Date   CORONARY ANGIOPLASTY WITH STENT PLACEMENT     CORONARY STENT PLACEMENT      Social History   Socioeconomic History   Marital status: Widowed    Spouse name: Not on file   Number of children: Not on file   Years of education: 10   Highest education level: 10th grade  Occupational History   Occupation: retired  Tobacco Use   Smoking status: Former    Current packs/day: 0.00    Types: Cigarettes    Quit date: 10/08/1963    Years since quitting: 60.6   Smokeless tobacco: Never  Vaping Use   Vaping status:  Never Used  Substance and Sexual Activity   Alcohol use: No   Drug use: No   Sexual activity: Not on file  Other Topics Concern   Not on file  Social History Narrative   Has a legal guardian    Diet:      Caffeine:      Married, if yes what year:       Do you live in a house, apartment, assisted living, condo, trailer, ect: Apartment      Is it one or more stories: one      How many persons live in your home? Self      Pets: No      Highest level or education completed: 10 th      Current/Past profession: Museum/gallery Curator      Exercise:                  Type and how often:           Living Will: No   DNR: No   POA/HPOA: No      Functional Status:   Do you have difficulty bathing or dressing yourself? No   Do you have difficulty preparing food or eating?   Do you have difficulty managing your medications?   Do you have difficulty managing your finances?   Do you have difficulty affording your medications? No      Right handed       Social Drivers of Health   Tobacco Use: Medium Risk (05/28/2024)   Patient History    Smoking Tobacco Use: Former    Smokeless Tobacco Use: Never    Passive Exposure: Not on file  Financial Resource Strain: Low Risk (05/28/2024)   Overall Financial Resource Strain (CARDIA)    Difficulty of Paying Living Expenses: Not hard at all  Food Insecurity: No Food Insecurity (05/28/2024)   Epic    Worried About Programme Researcher, Broadcasting/film/video in the Last Year: Never true    Ran Out of Food in the Last Year: Never true  Transportation Needs: No Transportation Needs (05/28/2024)   Epic    Lack of Transportation (Medical): No    Lack of Transportation (Non-Medical): No  Physical Activity: Unknown (05/28/2024)   Exercise Vital Sign    Days of Exercise per Week: Patient declined    Minutes of Exercise per Session: Not on file  Stress: Patient Declined (05/28/2024)   [employer] of Occupational Health - Occupational Stress Questionnaire    Feeling of Stress: Patient declined  Social Connections: Unknown (05/28/2024)   Social Connection and Isolation Panel    Frequency of Communication with Friends and Family: Patient declined    Frequency of Social Gatherings with Friends and Family: Patient declined    Attends Religious Services: Patient declined    Database Administrator or Organizations: Patient declined    Attends Banker Meetings: Not on file    Marital Status: Widowed  Intimate Partner Violence: Not At Risk (10/09/2023)   Humiliation, Afraid, Rape, and Kick questionnaire    Fear of Current or Ex-Partner: No    Emotionally Abused: No     Physically Abused: No    Sexually Abused: No  Depression (PHQ2-9): Medium Risk (02/19/2024)   Depression (PHQ2-9)    PHQ-2 Score: 9  Alcohol Screen: Low Risk (10/09/2023)   Alcohol Screen    Last Alcohol Screening Score (AUDIT): 0  Housing: Low Risk (05/28/2024)   Epic    Unable to Pay  for Housing in the Last Year: No    Number of Times Moved in the Last Year: 0    Homeless in the Last Year: No  Utilities: Not At Risk (10/09/2023)   [facility] Utilities    Threatened with loss of utilities: No  Health Literacy: Inadequate Health Literacy (10/09/2023)   B1300 Health Literacy    Frequency of need for help with medical instructions: Sometimes    Outpatient Encounter Medications as of 05/28/2024  Medication Sig   ASPIRIN 81 PO Take 81 mg by mouth 2 (two) times a week.   dapagliflozin  propanediol (FARXIGA ) 10 MG TABS tablet Take 1 tablet (10 mg total) by mouth daily.   diclofenac  Sodium (VOLTAREN ) 1 % GEL Apply 2 g topically 4 (four) times daily.   divalproex  (DEPAKOTE ) 125 MG DR tablet Take 1 tablet (125 mg total) by mouth 2 (two) times daily. (Patient taking differently: Take 125 mg by mouth once.)   glucose blood (ONETOUCH VERIO) test strip Use to test blood sugar three times daily. Dx: E11.40   insulin  isophane & regular human KwikPen (HUMULIN  70/30 KWIKPEN) (70-30) 100 UNIT/ML KwikPen Inject 20 Units into the skin daily. If fasting or evening blood sugar is 85 or less, reduce units by 3.   Insulin  Pen Needle 31G X 6 MM MISC Use for insulin  once daily. Dx: E11.40   Lancets (ONETOUCH ULTRASOFT) lancets Use to test blood sugar three time daily. Dx: E11.40   lisinopril -hydrochlorothiazide  (ZESTORETIC ) 10-12.5 MG tablet Take 1 tablet by mouth once daily   Nutritional Supplements (ENSURE NUTRITION SHAKE) LIQD Take 1 Can by mouth in the morning and at bedtime.   [DISCONTINUED] FARXIGA  5 MG TABS tablet Take 1 tablet by mouth once daily   atorvastatin  (LIPITOR) 10 MG tablet Take 1 tablet (10 mg total) by  mouth daily.   levocetirizine (XYZAL ) 5 MG tablet Take 0.5 tablets (2.5 mg total) by mouth every evening.   [DISCONTINUED] atorvastatin  (LIPITOR) 10 MG tablet Take 1 tablet (10 mg total) by mouth daily.   [DISCONTINUED] levocetirizine (XYZAL ) 5 MG tablet Take 0.5 tablets (2.5 mg total) by mouth every evening.   No facility-administered encounter medications on file as of 05/28/2024.    Allergies[1]  Pertinent ROS per HPI, otherwise unremarkable      Objective:  BP 109/67   Pulse 66   Temp 98.1 F (36.7 C)   Ht 5' 1 (1.549 m)   Wt 116 lb (52.6 kg)   SpO2 99%   BMI 21.92 kg/m    Wt Readings from Last 3 Encounters:  05/28/24 116 lb (52.6 kg)  05/20/24 117 lb (53.1 kg)  02/19/24 122 lb 6.4 oz (55.5 kg)    Physical Exam Vitals and nursing note reviewed.  Constitutional:      General: She is not in acute distress.    Appearance: Normal appearance.  HENT:     Head: Normocephalic and atraumatic.     Nose: Nose normal.     Mouth/Throat:     Mouth: Mucous membranes are moist.  Eyes:     General: No scleral icterus.    Extraocular Movements: Extraocular movements intact.     Conjunctiva/sclera: Conjunctivae normal.     Pupils: Pupils are equal, round, and reactive to light.  Pulmonary:     Effort: Pulmonary effort is normal.     Breath sounds: Normal breath sounds.  Musculoskeletal:        General: Normal range of motion.     Right lower leg: No  edema.     Left lower leg: No edema.  Skin:    General: Skin is warm and dry.     Findings: No rash.  Neurological:     Mental Status: She is alert.     Gait: Gait abnormal.     Comments: Slow unsteady   Psychiatric:        Mood and Affect: Mood normal.        Behavior: Behavior normal.        Thought Content: Thought content normal.        Judgment: Judgment normal.    Physical Exam      Results for orders placed or performed in visit on 02/19/24  Bayer DCA Hb A1c Waived   Collection Time: 02/19/24 11:07 AM   Result Value Ref Range   HB A1C (BAYER DCA - WAIVED) 8.8 (H) 4.8 - 5.6 %  Microscopic Examination   Collection Time: 02/19/24 12:52 PM   Urine  Result Value Ref Range   WBC, UA 6-10 (A) 0 - 5 /hpf   RBC, Urine 0-2 0 - 2 /hpf   Epithelial Cells (non renal) 0-10 0 - 10 /hpf   Renal Epithel, UA None seen None seen /hpf   Mucus, UA Present (A) Not Estab.   Bacteria, UA Few (A) None seen/Few   Yeast, UA None seen None seen  Urinalysis, Routine w reflex microscopic   Collection Time: 02/19/24 12:52 PM  Result Value Ref Range   Specific Gravity, UA 1.025 1.005 - 1.030   pH, UA 5.5 5.0 - 7.5   Color, UA Yellow Yellow   Appearance Ur Clear Clear   Leukocytes,UA 1+ (A) Negative   Protein,UA Negative Negative/Trace   Glucose, UA 1+ (A) Negative   Ketones, UA Negative Negative   RBC, UA Trace (A) Negative   Bilirubin, UA Negative Negative   Urobilinogen, Ur 0.2 0.2 - 1.0 mg/dL   Nitrite, UA Negative Negative   Microscopic Examination See below:        Pertinent labs & imaging results that were available during my care of the patient were reviewed by me and considered in my medical decision making.  Assessment & Plan:  Nareh was seen today for diabetes, weight loss and neurologic problem.  Diagnoses and all orders for this visit:  Type 2 diabetes mellitus with diabetic neuropathy, with long-term current use of insulin  (HCC) -     Bayer DCA Hb A1c Waived -     atorvastatin  (LIPITOR) 10 MG tablet; Take 1 tablet (10 mg total) by mouth daily. -     dapagliflozin  propanediol (FARXIGA ) 10 MG TABS tablet; Take 1 tablet (10 mg total) by mouth daily.  Seasonal allergic rhinitis due to pollen -     levocetirizine (XYZAL ) 5 MG tablet; Take 0.5 tablets (2.5 mg total) by mouth every evening.  Essential hypertension  Dementia, Lewy body with behavior disturbance (HCC)     Assessment and Plan [place], no acute distress. Assessment & Plan Lewy body dementia with behavioral  disturbance Declining mental health with wandering. Daughter unable to provide safe care, seeking long-term placement. - Advised daughter to administer Depakote  125 mg twice daily. - Continue Xyzal  5 mg at bedtime. - Her daughter will call regarding finding a suitable place for transition to long-term care placement  Type 2 diabetes mellitus with diabetic neuropathy A1c improved from 8.8% to 8.5% still not well-controlled. - Increased Farxiga  dosage. - Continue insulin  therapy.  Essential hypertension - Continue Lisinopril -hydrochlorothiazide  (Zestoretic ) 10-12.5  mg daily.  Mixed hyperlipidemia Managed with Atorvastatin  10 mg daily. No side effects. - Continue Atorvastatin  (Lipitor) 10 mg daily.      Continue all other maintenance medications.  Follow up plan: Return in about 3 months (around 08/26/2024) for Chronic disease management.   Continue healthy lifestyle choices, including diet (rich in fruits, vegetables, and lean proteins, and low in salt and simple carbohydrates) and exercise (at least 30 minutes of moderate physical activity daily).  Educational handout given for   Clinical References  Lewy Body Dementia Lewy body dementia, or LBD, is a condition that affects the way your brain works. This type of dementia happens when proteins called Lewy bodies build up in the brain. It causes problems with memory, thinking, movement, and behavior. This condition gets worse over time. There's no cure, but some treatments can help with the symptoms. What are the causes? LBD happens when Lewy bodies build up in parts of the brain that control memory, thinking, and movement. What causes these proteins to build up isn't known. What increases the risk? Having a family history of LBD or Parkinson's disease. Being 34 years old or older. Being female. What are the signs or symptoms? Symptoms may include: Hallucinating. This means you see, hear, taste, smell, or feel things that are not  real. Sleep problems, such as acting out dreams while you're asleep. Changes in memory, attention, and ability to focus that come and go. Symptoms of dementia, such as having trouble with: Memory. Paying attention. Planning and organizing. Making good choices. Speaking. Behavior. People with LBD may also have symptoms of Parkinson's disease. These may include: Shaking movements or tremors that you can't control. This usually starts in a hand or foot when you're resting. Stooped posture. Slow movement. Stiff muscles. Loss of balance when standing. How is this diagnosed? LBD is diagnosed by an expert called a neurologist. It may be diagnosed based on: Your symptoms and medical history. Your health care provider will talk with you and your family, friends, or caregivers about your history and symptoms. A physical exam. Tests. These may include: Lab tests, such as blood or pee (urine) tests. Imaging tests. You may have a CT scan, a PET scan, or an MRI. A lumbar puncture. For this test, a sample of the fluid around the brain and spinal cord is taken and tested. A skin biopsy. This is when a skin sample is taken. The sample is looked at under a microscope to check for the protein. Tests to check your thinking and memory. How is this treated? There's no cure for LBD. Treatment helps manage your symptoms and might include: Medicines to help with hallucinations, sleep, and behavior problems. Therapy to help with talking or movement. This might be speech, occupational, and physical therapy. Your provider can help you find support groups and other people who can help with your care. Follow these instructions at home: Medicines Take over-the-counter and prescription medicines only as told by your provider. Use a pill organizer or pill reminder to help keep track of your medicines. Do not take medicines that can affect your thinking. This includes pain medicines and some medicines to help with  sleep. Always check with your provider before taking any new medicines. Lifestyle Make healthy choices: Exercise and be active as told by your provider. Do not use any products that contain nicotine or tobacco. These products include cigarettes, chewing tobacco, and vaping devices, such as e-cigarettes. If you need help quitting, ask your provider. When you feel  a lot of stress, do something that helps you relax. Try things like mindfulness, yoga, or deep breathing. Spend time with other people. Talk with family, friends, and neighbors often. Make sure you sleep well. These tips can help: Try not to take naps during the day. Keep your bedroom dark and cool. Do not exercise in the few hours before you go to bed. Do not have foods or drinks with caffeine in them at night. Eating and drinking Do not drink alcohol. Drink enough fluid to keep your pee (urine) pale yellow. Eat a healthy diet. Safety  Talk with your provider to decide: What things you need help with. How to stay safe. If you have trouble walking, use a cane or walker as told by your provider. Make sure your home is safe. Get rid of things that you could trip over, such as throw rugs or clutter. Put grab bars and railings in your home to keep you from falling. Talk with your provider about whether it's safe for you to drive. If told, wear an alert bracelet that tracks where you are and shows that you're a person with memory loss. Make sure you wear it at all times. General instructions  Work with your family to make big legal or health decisions. This may include things like advance directives, medical power of attorney, or a living will. Join a support group for people with LBD. Where to find more information [employer] of Neurological Disorders and Stroke: basicfm.no Lewy Body Dementia Association: lbda.org Contact a health care provider if: You have confusion that's new or getting worse. You have trouble  sleeping that's new or getting worse. You get sleepy during the day more often. You have problems with choking or swallowing. Get help right away if: You feel depressed or very sad or feel like you may hurt yourself. Your family members are worried about your safety. Get help right away if you feel like you may hurt yourself or others, or have thoughts about taking your own life. Go to your nearest emergency room or: Call 911. Call the National Suicide Prevention Lifeline at 639 012 8975 or 988. This is open 24 hours a day. Text the Crisis Text Line at 219-457-4670. This information is not intended to replace advice given to you by your health care provider. Make sure you discuss any questions you have with your health care provider. Document Revised: 07/25/2022 Document Reviewed: 07/25/2022 Elsevier Patient Education  2024 Elsevier Inc. How to Take Your Blood Pressure Blood pressure is a measurement of how strongly your blood is pressing against the walls of your arteries. Arteries are blood vessels that carry blood from your heart throughout your body. Your health care provider takes your blood pressure at each office visit. You can also take your own blood pressure at home with a blood pressure monitor. You may need to take your own blood pressure to: Confirm a diagnosis of high blood pressure (hypertension). Monitor your blood pressure over time. Make sure your blood pressure medicine is working. Supplies needed: Blood pressure monitor. A chair to sit in. This should be a chair where you can sit upright with your back supported. Do not sit on a soft couch or an armchair. Table or desk. Small notebook and pencil or pen. How to prepare To get the most accurate reading, avoid the following for 30 minutes before you check your blood pressure: Drinking caffeine. Drinking alcohol. Eating. Smoking. Exercising. Five minutes before you check your blood pressure: Use the bathroom  and urinate  so that you have an empty bladder. Sit quietly in a chair. Do not talk. How to take your blood pressure To check your blood pressure, follow the instructions in the manual that came with your blood pressure monitor. If you have a digital blood pressure monitor, the instructions may be as follows: Sit up straight in a chair. Place your feet on the floor. Do not cross your ankles or legs. Rest your left arm at the level of your heart on a table or desk or on the arm of a chair. Pull up your shirt sleeve. Wrap the blood pressure cuff around the upper part of your left arm, 1 inch (2.5 cm) above your elbow. It is best to wrap the cuff around bare skin. Fit the cuff snugly, but not too tightly, around your arm. You should be able to place only one finger between the cuff and your arm. Position the cord so that it rests in the bend of your elbow. Press the power button. Sit quietly while the cuff inflates and deflates. Read the digital reading on the monitor screen and write the numbers down (record them) in a notebook. Wait 2-3 minutes, then repeat the steps, starting at step 1. What does my blood pressure reading mean? A blood pressure reading consists of a higher number over a lower number. Ideally, your blood pressure should be below 120/80. The first (top) number is called the systolic pressure. It is a measure of the pressure in your arteries as your heart beats. The second (bottom) number is called the diastolic pressure. It is a measure of the pressure in your arteries as the heart relaxes. Blood pressure is classified into four stages. The following are the stages for adults who do not have a short-term serious illness or a chronic condition. Systolic pressure and diastolic pressure are measured in a unit called mm Hg (millimeters of mercury).  Normal Systolic pressure: below 120. Diastolic pressure: below 80. Elevated Systolic pressure: 120-129. Diastolic pressure: below  80. Hypertension stage 1 Systolic pressure: 130-139. Diastolic pressure: 80-89. Hypertension stage 2 Systolic pressure: 140 or above. Diastolic pressure: 90 or above. You can have elevated blood pressure or hypertension even if only the systolic or only the diastolic number in your reading is higher than normal. Follow these instructions at home: Medicines Take over-the-counter and prescription medicines only as told by your health care provider. Tell your health care provider if you are having any side effects from blood pressure medicine. General instructions Check your blood pressure as often as recommended by your health care provider. Check your blood pressure at the same time every day. Take your monitor to the next appointment with your health care provider to make sure that: You are using it correctly. It provides accurate readings. Understand what your goal blood pressure numbers are. Keep all follow-up visits. This is important. General tips Your health care provider can suggest a reliable monitor that will meet your needs. There are several types of home blood pressure monitors. Choose a monitor that has an arm cuff. Do not choose a monitor that measures your blood pressure from your wrist or finger. Choose a cuff that wraps snugly, not too tight or too loose, around your upper arm. You should be able to fit only one finger between your arm and the cuff. You can buy a blood pressure monitor at most drugstores or online. Where to find more information American Heart Association: www.heart.org Contact a health care provider if:  Your blood pressure is consistently high. Your blood pressure is suddenly low. Get help right away if: Your systolic blood pressure is higher than 180. Your diastolic blood pressure is higher than 120. These symptoms may be an emergency. Get help right away. Call 911. Do not wait to see if the symptoms will go away. Do not drive yourself to the  hospital. Summary Blood pressure is a measurement of how strongly your blood is pressing against the walls of your arteries. A blood pressure reading consists of a higher number over a lower number. Ideally, your blood pressure should be below 120/80. Check your blood pressure at the same time every day. Avoid caffeine, alcohol, smoking, and exercise for 30 minutes prior to checking your blood pressure. These agents can affect the accuracy of the blood pressure reading. This information is not intended to replace advice given to you by your health care provider. Make sure you discuss any questions you have with your health care provider. Document Revised: 01/21/2021 Document Reviewed: 01/21/2021 Elsevier Patient Education  2024 Elsevier Inc. Allergic Rhinitis, Adult  Allergic rhinitis is a reaction to allergens. Allergens are things that can cause an allergic reaction. This condition affects the lining inside the nose (mucous membrane). There are two types of allergic rhinitis: Seasonal. This type is also called hay fever. It happens only during some times of the year. Perennial. This type can happen at any time of the year. This condition cannot be spread from person to person (is not contagious). It can be mild, bad, or very bad. It can develop at any age and may be outgrown. What are the causes? Pollen from grasses, trees, and weeds. Other causes can be: Dust mites. Smoke. Mold. Car fumes. The pee (urine), spit, or dander of pets. Dander is dead skin cells from a pet. What increases the risk? You are more likely to develop this condition if: You have allergies in your family. You have problems like allergies in your family. You may have: Swelling of parts of your eyes and eyelids. Asthma. This affects how you breathe. Long-term redness and swelling on your skin. Food allergies. What are the signs or symptoms? The main symptom of this condition is a runny or stuffy nose (nasal  congestion). Other symptoms may include: Sneezing or coughing. Itching and tearing of your eyes. Mucus that drips down the back of your throat (postnasal drip). This may cause a sore throat. Trouble sleeping. Feeling tired. Headache. How is this treated? There is no cure for this condition. You should avoid things that you are allergic to. Treatment can help to relieve symptoms. This may include: Medicines that block allergy symptoms, such as anti-inflammatories or antihistamines. These may be given as a shot, nasal spray, or pill. Avoiding things you are allergic to. Medicines that give you some of what you are allergic to over time. This is called immunotherapy. It is done if other treatments do not help. You may get: Shots. Medicine under your tongue. Stronger medicines, if other treatments do not help. Follow these instructions at home: Avoiding allergens Find out what things you are allergic to and avoid them. To do this, try these things: If you get allergies any time of year: Replace carpet with wood, tile, or vinyl flooring. Carpet can trap pet dander and dust. Do not smoke. Do not allow smoking in your home. Change your heating and air conditioning filters at least once a month. If you get allergies only some times of the year: Keep  windows closed when you can. Plan things to do outside when pollen counts are lowest. Check pollen counts before you plan things to do outside. When you come indoors, change your clothes and shower before you sit on furniture or bedding. If you are allergic to a pet: Keep the pet out of your bedroom. Vacuum, sweep, and dust often. General instructions Take over-the-counter and prescription medicines only as told by your doctor. Drink enough fluid to keep your pee pale yellow. Where to find more information American Academy of Allergy, Asthma & Immunology: aaaai.org Contact a doctor if: You have a fever. You get a cough that does not go  away. You make high-pitched whistling sounds when you breathe, most often when you breathe out (wheeze). Your symptoms slow you down. Your symptoms stop you from doing your normal things each day. Get help right away if: You are short of breath. This symptom may be an emergency. Get help right away. Call 911. Do not wait to see if the symptom will go away. Do not drive yourself to the hospital. This information is not intended to replace advice given to you by your health care provider. Make sure you discuss any questions you have with your health care provider. Document Revised: 01/17/2022 Document Reviewed: 01/17/2022 Elsevier Patient Education  2024 Else[employer] Inc.  The above assessment and management plan was discussed with the patient. The patient verbalized understanding of and has agreed to the management plan. Patient is aware to call the clinic if they develop any new symptoms or if symptoms persist or worsen. Patient is aware when to return to the clinic for a follow-up visit. Patient educated on when it is appropriate to go to the emergency department.   Demetri Kerman [place] Thompson, DNP [facility] [address] [place], [place] [address] [phone]       [1] No Known Allergies

## 2024-05-28 NOTE — Telephone Encounter (Signed)
 Called office to r/c regarding this message. If pt is needing a referral, please order a new referral to process on the [facility] portal. Thank you.

## 2024-05-28 NOTE — Telephone Encounter (Unsigned)
 Copied from CRM #[id number]. Topic: Referral - Question >> May 28, 2024  3:30 PM Alfonso ORN wrote: Reason for CRM:  lavetta Barrack from [facility] ask to speak with someone in the referral department regarding needing an authorization [facility] [phone]

## 2024-05-29 ENCOUNTER — Ambulatory Visit: Payer: Self-pay | Admitting: Nurse Practitioner

## 2024-05-29 NOTE — Telephone Encounter (Signed)
"  Referral placed   "

## 2024-05-30 ENCOUNTER — Other Ambulatory Visit: Payer: Self-pay | Admitting: *Deleted

## 2024-05-30 ENCOUNTER — Telehealth: Payer: Self-pay

## 2024-05-30 MED ORDER — BLOOD GLUCOSE MONITORING SUPPL DEVI: 1.0000 | 0 refills | Status: AC

## 2024-05-30 MED ORDER — LANCET DEVICE MISC: 1.0000 | 0 refills | Status: AC

## 2024-05-30 MED ORDER — LANCETS MISC: 1.0000 | 0 refills | Status: AC

## 2024-05-30 MED ORDER — BLOOD GLUCOSE TEST VI STRP: 1.0000 | ORAL_STRIP | 0 refills | Status: AC

## 2024-05-30 MED ORDER — BLOOD GLUCOSE MONITORING SUPPL DEVI: 1.0000 | 0 refills | Status: DC

## 2024-05-30 NOTE — Telephone Encounter (Signed)
 Ok to fill?

## 2024-05-30 NOTE — Telephone Encounter (Signed)
 Prescription sent. Patient notified.

## 2024-05-30 NOTE — Telephone Encounter (Signed)
 Copied from CRM #[id number]. Topic: Clinical - Prescription Issue >> May 30, 2024 11:17 AM Wess RAMAN wrote: Reason for CRM: Patient's daughter, [name] , stated patient needs a new prescription for contour test strips and meter.  Callback #: [id number]  Pharmacy: [facility] [address], [place] - [address] [place] [address] Phone: [phone] Fax: [phone] Hours: Not open 24 hours

## 2024-05-30 NOTE — Addendum Note (Signed)
 Addended by: MILAS CONGRESS D on: 05/30/2024 02:29 PM   Modules accepted: Orders

## 2024-06-05 ENCOUNTER — Telehealth: Payer: Self-pay | Admitting: Nurse Practitioner

## 2024-06-05 NOTE — Telephone Encounter (Signed)
 Copied from CRM [id number]. Topic: Clinical - Medical Advice >> Jun 05, 2024  2:10 PM Myrick T wrote: Reason for CRM: patients daughter [name] said patient has dementia and wants to see if she need to place her mom in an assisting living facility or a skilled nursing facility. Daughter says patient can not live alone anymore

## 2024-06-07 NOTE — Telephone Encounter (Signed)
 Jun 07, 2024  2:57 PM Graeme ORN wrote: Patients daughter called back. Has not heard back from provider. Is trying to see how to proceed. She need FL2 paper work completed and would like to speak with someone to see what else needs to be done or get some direction about proceeding with placement. Thank You  Dopes this need to be done by neurology or does have to come from primary care?

## 2024-06-07 NOTE — Telephone Encounter (Unsigned)
 Copied from CRM [id number]. Topic: Clinical - Medical Advice >> Jun 05, 2024  2:10 PM Myrick T wrote: Reason for CRM: patients daughter [name] said patient has dementia and wants to see if she need to place her mom in an assisting living facility or a skilled nursing facility. Daughter says patient can not live alone anymore >> Jun 07, 2024  2:57 PM Graeme ORN wrote: Patients daughter called back. Has not heard back from provider. Is trying to see how to proceed. She need FL2 paper work completed and would like to speak with someone to see what else needs to be done or get some direction about proceeding with placement. Thank You

## 2024-06-10 ENCOUNTER — Telehealth: Payer: Self-pay | Admitting: Nurse Practitioner

## 2024-06-10 NOTE — Telephone Encounter (Unsigned)
 Copied from CRM [id number]. Topic: General - Other >> Jun 10, 2024 10:18 AM Debby BROCKS wrote: Reason for CRM: Patients Daughter Briana Gearing) called in to provide the information were the [facility] paperwork would be sent to for the dementia placement  [facility] [address] [phone]  Manager Erla Romp # [phone]

## 2024-06-10 NOTE — Telephone Encounter (Signed)
Noted  -LS

## 2024-06-13 ENCOUNTER — Telehealth: Payer: Self-pay | Admitting: Nurse Practitioner

## 2024-06-13 NOTE — Telephone Encounter (Signed)
 Copied from CRM #[id number]. Topic: General - Other >> Jun 13, 2024  8:34 AM Cherylann RAMAN wrote: Reason for CRM: [name] Belchin Briana Gearing), called to confirm if FL2 form, Dx, and provider notes was sent over to the assisted living home for the patient. Please advise, [name] states that it has been last week since she has spoke with Sebastian regarding this issue. Please contact [name] at [phone] to confirm that items were sent over to the assisted living for the patient and Erla Romp at [phone] confirm that items were received. As patient's dementia is worsening.

## 2024-06-14 NOTE — Telephone Encounter (Signed)
 Copied from CRM #[id number]. Topic: General - Other >> Jun 13, 2024  8:34 AM Cherylann RAMAN wrote: Reason for CRM: [name] Belchin Briana Gearing), called to confirm if FL2 form, Dx, and provider notes was sent over to the assisted living home for the patient. Please advise, [name] states that it has been last week since she has spoke with Sebastian regarding this issue. Please contact [name] at [phone] to confirm that items were sent over to the assisted living for the patient and Erla Romp at [phone] confirm that items were received. As patient's dementia is worsening. >> Jun 14, 2024  8:07 AM Antwanette L wrote: [name], the patients daughter, called requesting an update on the status of the patients FL2. She is asking for a callback with the current status and any next steps. Please follow up with [name] [phone]

## 2024-06-18 NOTE — Telephone Encounter (Signed)
 I believe I typed this up and placed FL2 on PCP's desk at the end of last week.

## 2024-06-20 NOTE — Telephone Encounter (Signed)
 Aware FL2 faxed to [facility]

## 2024-08-27 ENCOUNTER — Encounter: Admitting: Family Medicine
